# Patient Record
Sex: Female | Born: 1980 | Race: White | Hispanic: No | Marital: Married | Smoking: Former smoker
Health system: Southern US, Community
[De-identification: ages and names within clinical notes are randomized; demographics above are authoritative.]

## PROBLEM LIST (undated history)

## (undated) DIAGNOSIS — F410 Panic disorder [episodic paroxysmal anxiety] without agoraphobia: Secondary | ICD-10-CM

## (undated) DIAGNOSIS — E282 Polycystic ovarian syndrome: Secondary | ICD-10-CM

## (undated) DIAGNOSIS — IMO0002 Reserved for concepts with insufficient information to code with codable children: Secondary | ICD-10-CM

## (undated) DIAGNOSIS — R87619 Unspecified abnormal cytological findings in specimens from cervix uteri: Secondary | ICD-10-CM

## (undated) DIAGNOSIS — B029 Zoster without complications: Secondary | ICD-10-CM

## (undated) DIAGNOSIS — M797 Fibromyalgia: Secondary | ICD-10-CM

## (undated) HISTORY — DX: Fibromyalgia: M79.7

## (undated) HISTORY — PX: BREAST BIOPSY: SHX20

## (undated) HISTORY — DX: Zoster without complications: B02.9

## (undated) HISTORY — PX: WISDOM TOOTH EXTRACTION: SHX21

---

## 2001-02-09 ENCOUNTER — Other Ambulatory Visit: Admission: RE | Admit: 2001-02-09 | Discharge: 2001-02-09 | Payer: Self-pay | Admitting: Emergency Medicine

## 2003-10-21 ENCOUNTER — Other Ambulatory Visit: Admission: RE | Admit: 2003-10-21 | Discharge: 2003-10-21 | Payer: Self-pay | Admitting: Obstetrics and Gynecology

## 2005-05-03 ENCOUNTER — Other Ambulatory Visit: Admission: RE | Admit: 2005-05-03 | Discharge: 2005-05-03 | Payer: Self-pay | Admitting: Obstetrics and Gynecology

## 2006-04-27 ENCOUNTER — Other Ambulatory Visit: Admission: RE | Admit: 2006-04-27 | Discharge: 2006-04-27 | Payer: Self-pay | Admitting: Obstetrics and Gynecology

## 2007-05-08 ENCOUNTER — Other Ambulatory Visit: Admission: RE | Admit: 2007-05-08 | Discharge: 2007-05-08 | Payer: Self-pay | Admitting: Obstetrics and Gynecology

## 2008-05-14 ENCOUNTER — Other Ambulatory Visit: Admission: RE | Admit: 2008-05-14 | Discharge: 2008-05-14 | Payer: Self-pay | Admitting: Obstetrics and Gynecology

## 2009-07-21 ENCOUNTER — Other Ambulatory Visit: Admission: RE | Admit: 2009-07-21 | Discharge: 2009-07-21 | Payer: Self-pay | Admitting: Obstetrics and Gynecology

## 2010-01-02 ENCOUNTER — Ambulatory Visit: Payer: Self-pay | Admitting: Internal Medicine

## 2010-01-05 LAB — CONVERTED CEMR LAB
AST: 20 units/L (ref 0–37)
Alkaline Phosphatase: 52 units/L (ref 39–117)
BUN: 9 mg/dL (ref 6–23)
Basophils Absolute: 0.4 10*3/uL — ABNORMAL HIGH (ref 0.0–0.1)
Bilirubin Urine: NEGATIVE
Bilirubin, Direct: 0.1 mg/dL (ref 0.0–0.3)
Calcium: 9 mg/dL (ref 8.4–10.5)
GFR calc non Af Amer: 90.74 mL/min (ref >60)
Glucose, Bld: 88 mg/dL (ref 70–99)
HDL: 82.1 mg/dL (ref >39.00)
LDL Cholesterol: 79 mg/dL (ref 0–99)
Leukocytes, UA: NEGATIVE
Lymphocytes Relative: 36.9 % (ref 12.0–46.0)
Monocytes Relative: 5.9 % (ref 3.0–12.0)
Nitrite: NEGATIVE
Platelets: 354 10*3/uL (ref 150.0–400.0)
RDW: 12.4 % (ref 11.5–14.6)
Sodium: 140 meq/L (ref 135–145)
Total Bilirubin: 0.3 mg/dL (ref 0.3–1.2)
Total CHOL/HDL Ratio: 2
Total Protein, Urine: NEGATIVE mg/dL
VLDL: 15.6 mg/dL (ref 0.0–40.0)
WBC: 7.3 10*3/uL (ref 4.5–10.5)

## 2010-01-06 ENCOUNTER — Ambulatory Visit: Payer: Self-pay | Admitting: Internal Medicine

## 2010-01-06 DIAGNOSIS — F329 Major depressive disorder, single episode, unspecified: Secondary | ICD-10-CM

## 2010-01-06 DIAGNOSIS — J309 Allergic rhinitis, unspecified: Secondary | ICD-10-CM | POA: Insufficient documentation

## 2010-01-06 DIAGNOSIS — Z9189 Other specified personal risk factors, not elsewhere classified: Secondary | ICD-10-CM | POA: Insufficient documentation

## 2010-01-06 DIAGNOSIS — F419 Anxiety disorder, unspecified: Secondary | ICD-10-CM

## 2010-12-01 NOTE — Assessment & Plan Note (Signed)
Summary: NEW PT CPX// [employer] / [employer]  #   Vital Signs:  Patient profile:   30 year old female Height:      66.5 inches (168.91 cm) Weight:      142.0 pounds (64.55 kg) BMI:     22.66 O2 Sat:      93 % on Room air Temp:     99.2 degrees F (37.33 degrees C) oral Pulse rate:   101 / minute BP sitting:   120 / 82  (left arm) Cuff size:   regular  Vitals Entered By: Orlan Leavens (January 06, 2010 2:04 PM)  O2 Flow:  Room air CC: New patient cpx  Is Patient Diabetic? No Pain Assessment Patient in pain? no        Primary Care Provider:  Newt Lukes MD  CC:  New patient cpx .  History of Present Illness: new pt to me and our practice - patient is here today for annual physical. Patient feels well and has no complaints.   intrested in becoming pregnant in next 2-3 months - concerned about taking cymbalta during pregnancy - took for anxiety over the past year - feels anxiety symptoms under good control -  Preventive Screening-Counseling & Management  Alcohol-Tobacco     Alcohol drinks/day: <1     Alcohol Counseling: not indicated; use of alcohol is not excessive or problematic     Smoking Status: quit     Tobacco Counseling: not to resume use of tobacco products  Caffeine-Diet-Exercise     Does Patient Exercise: yes     Exercise Counseling: not indicated; exercise is adequate     Depression Counseling: not indicated; screening negative for depression  Safety-Violence-Falls     Seat Belt Use: yes     Helmet Use: yes     Firearms in the Home: firearms in the home     Firearm Counseling: not indicated; uses recommended firearm safety measures     Smoke Detectors: yes     Violence in the Home: no risk noted     Fall Risk Counseling: not indicated; no significant falls noted  Current Medications (verified): 1)  Trinessa (28) 0.18/0.215/0.25 Mg-35 Mcg Tabs (Norgestim-Eth Estrad Triphasic) .... Once Daily 2)  Singulair 10 Mg Tabs (Montelukast Sodium) .... Take 1 By Mouth  Qd 3)  Cymbalta 30 Mg Cpep (Duloxetine Hcl) .... Take 1 By Mouth Once Daily 4)  Loratadine 10 Mg Tabs (Loratadine) .... Take 1 By Mouth Qd 5)  Calcium 600 Mg Tabs (Calcium) .... Take 1 By Mouth Once Daily 6)  Flaxseed Oil 1000 Mg Caps (Flaxseed (Linseed)) .... Once Daily 7)  Pre-Natal Formula  Tabs (Prenatal Multivit-Min-Fe-Fa) .... 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 I have reviewed all other systems and they were negative.   Physical Exam  General:  alert, well-developed, well-nourished, and cooperative to examination.    Eyes:  vision grossly intact; pupils equal, round and reactive  to light.  conjunctiva and lids normal.    Ears:  normal pinnae bilaterally, without erythema, swelling, or tenderness to palpation. TMs clear, without effusion, or cerumen impaction. Hearing grossly normal bilaterally  Nose:  External nasal examination shows no deformity or inflammation. Nasal mucosa are pink and moist without lesions or exudates. Mouth:  teeth and gums in good repair; mucous membranes moist, without lesions or ulcers. oropharynx clear without exudate, no erythema.  Neck:  supple, full ROM, no masses, no thyromegaly; no thyroid nodules or tenderness. no JVD or carotid bruits.   Lungs:  normal respiratory effort, no intercostal retractions or use of  accessory muscles; normal breath sounds bilaterally - no crackles and no wheezes.    Heart:  normal rate, regular rhythm, no murmur, and no rub. BLE without edema.  Abdomen:  soft, non-tender, normal bowel sounds, no distention; no masses and no appreciable hepatomegaly or splenomegaly.   Genitalia:  defer to gyn Msk:  No deformity or scoliosis noted of thoracic or lumbar spine.   Neurologic:  alert & oriented X3 and cranial nerves II-XII symetrically intact.  strength normal in all extremities, sensation intact to light touch, and gait normal. speech fluent without dysarthria or aphasia; follows commands with good comprehension.  Skin:  no rashes, vesicles, ulcers, or erythema. No nodules or irregularity to palpation.  Psych:  Oriented X3, memory intact for recent and remote, normally interactive, good eye contact, not anxious appearing, not depressed appearing, and not agitated.      Impression & Recommendations:  Problem # 1:  PREVENTIVE HEALTH CARE (ICD-V70.0) Patient has been counseled on age-appropriate routine health concerns for screening and prevention. These are reviewed and up-to-date. Immunizations are up-to-date or declined. Labs reviewed. ok to stop BCP and taper SNRI (but pt to watch for recurrent symptoms of anxiety/depression as needed)  Complete Medication List: 1)  Singulair 10 Mg Tabs (Montelukast sodium) .... Take 1 by mouth qd 2)  Loratadine 10 Mg Tabs (Loratadine) .... Take 1 by mouth qd 3)  Calcium 600 Mg Tabs (Calcium) .... Take 1 by mouth once daily 4)  Flaxseed Oil 1000 Mg Caps (Flaxseed (linseed)) .... Once daily 5)  Pre-natal Formula Tabs (Prenatal multivit-min-fe-fa) .... Once daily 6)  Cymbalta 30 Mg Cpep (Duloxetine hcl) .... Take 1 by mouth every other day x 2 weeks, then stop 7)  Trinessa (28) 0.18/0.215/0.25 Mg-35 Mcg Tabs (Norgestim-eth estrad triphasic) .... Stop  Patient Instructions: 1)  it was good to see you today. 2)  exam and labs look great! good  luck! 3)  taper off cymbalta as discussed - let us know (or your ob-gyn) if you have any recurrent symptoms of anxiety or depression at any time going forward as there are other medication options if needed- 4)  Please schedule a follow-up appointment annually for medical physical and labs, sooner if problems.    Immunization History:  Tetanus/Td Immunization History:    Tetanus/Td:  historical (09/01/2006)  Influenza Immunization History:    Influenza:  historical (08/01/2009)    Pap Smear  Procedure date:  07/02/2009  Findings:      Interpretation/Result:Negative for intraepithelial Lesion or Malignancy.

## 2011-01-11 ENCOUNTER — Encounter (INDEPENDENT_AMBULATORY_CARE_PROVIDER_SITE_OTHER): Payer: Self-pay | Admitting: *Deleted

## 2011-01-11 ENCOUNTER — Other Ambulatory Visit: Payer: Self-pay | Admitting: Internal Medicine

## 2011-01-11 ENCOUNTER — Encounter (INDEPENDENT_AMBULATORY_CARE_PROVIDER_SITE_OTHER): Payer: 59 | Admitting: Internal Medicine

## 2011-01-11 ENCOUNTER — Encounter: Payer: Self-pay | Admitting: Internal Medicine

## 2011-01-11 ENCOUNTER — Other Ambulatory Visit: Payer: Self-pay

## 2011-01-11 DIAGNOSIS — E785 Hyperlipidemia, unspecified: Secondary | ICD-10-CM

## 2011-01-11 DIAGNOSIS — Z0000 Encounter for general adult medical examination without abnormal findings: Secondary | ICD-10-CM

## 2011-01-11 DIAGNOSIS — E282 Polycystic ovarian syndrome: Secondary | ICD-10-CM | POA: Insufficient documentation

## 2011-01-11 LAB — BASIC METABOLIC PANEL
BUN: 14 mg/dL (ref 6–23)
CO2: 25 mEq/L (ref 19–32)
Chloride: 104 mEq/L (ref 96–112)
Glucose, Bld: 88 mg/dL (ref 70–99)
Potassium: 4 mEq/L (ref 3.5–5.1)

## 2011-01-11 LAB — URINALYSIS
Bilirubin Urine: NEGATIVE
Ketones, ur: NEGATIVE
Leukocytes, UA: NEGATIVE
Urobilinogen, UA: 0.2 (ref 0.0–1.0)

## 2011-01-11 LAB — TSH: TSH: 2.28 u[IU]/mL (ref 0.35–5.50)

## 2011-01-11 LAB — CBC WITH DIFFERENTIAL/PLATELET
Basophils Absolute: 0 10*3/uL (ref 0.0–0.1)
Eosinophils Absolute: 0.1 10*3/uL (ref 0.0–0.7)
Hemoglobin: 13.8 g/dL (ref 12.0–15.0)
Lymphocytes Relative: 36.4 % (ref 12.0–46.0)
MCHC: 34.2 g/dL (ref 30.0–36.0)
Neutro Abs: 3.7 10*3/uL (ref 1.4–7.7)
RDW: 13.2 % (ref 11.5–14.6)

## 2011-01-11 LAB — HEPATIC FUNCTION PANEL
ALT: 17 U/L (ref 0–35)
AST: 18 U/L (ref 0–37)
Albumin: 4.4 g/dL (ref 3.5–5.2)
Total Protein: 7.3 g/dL (ref 6.0–8.3)

## 2011-01-11 LAB — LIPID PANEL: HDL: 73.5 mg/dL (ref >39.00)

## 2011-01-19 NOTE — Assessment & Plan Note (Signed)
Summary: PHYSICAL--STC   Vital Signs:  Patient profile:   30 year old female Weight:      137.0 pounds (62.27 kg) BMI:     21.86 O2 Sat:      98 % on Room air Temp:     99.5 degrees F (37.50 degrees C) oral Pulse rate:   102 / minute BP sitting:   102 / 62  (left arm) Cuff size:   regular  Vitals Entered By: Orlan Leavens RMA (January 11, 2011 2:01 PM)  O2 Flow:  Room air CC: CPX Is Patient Diabetic? No Pain Assessment Patient in pain? no        Primary Care Provider:  Newt Lukes MD  CC:  CPX.  History of Present Illness:  patient is here today for annual physical. Patient feels well and has no complaints.    reviewed other chronic med issues: dx PCOS 11/2010 while undergoing eval for infertility - started on metformin for same  anxiety and depression -concerned about taking cymbalta during pregnancy and off x 8 mo but resumed when dx with pcos/infertility  Preventive Screening-Counseling & Management  Alcohol-Tobacco     Alcohol drinks/day: <1     Alcohol Counseling: not indicated; use of alcohol is not excessive or problematic     Smoking Status: quit     Tobacco Counseling: not to resume use of tobacco products  Caffeine-Diet-Exercise     Does Patient Exercise: yes     Exercise Counseling: not indicated; exercise is adequate     Depression Counseling: not indicated; screening negative for depression  Safety-Violence-Falls     Seat Belt Use: yes     Helmet Use: yes     Firearms in the Home: firearms in the home     Firearm Counseling: not indicated; uses recommended firearm safety measures     Smoke Detectors: yes     Violence in the Home: no risk noted     Fall Risk Counseling: not indicated; no significant falls noted  Clinical Review Panels:  Prevention   Last Pap Smear:  Interpretation/Result:Negative for intraepithelial Lesion or Malignancy.    (07/02/2009)  Immunizations   Last Tetanus Booster:  Historical (09/01/2006)   Last Flu  Vaccine:  Historical (08/01/2009)  Lipid Management   Cholesterol:  205 (01/11/2011)   LDL (bad choesterol):  79 (01/02/2010)   HDL (good cholesterol):  73.50 (01/11/2011)  CBC   WBC:  6.7 (01/11/2011)   RBC:  4.61 (01/11/2011)   Hgb:  13.8 (01/11/2011)   Hct:  40.3 (01/11/2011)   Platelets:  334.0 (01/11/2011)   MCV  87.4 (01/11/2011)   MCHC  34.2 (01/11/2011)   RDW  13.2 (01/11/2011)   PMN:  55.3 (01/11/2011)   Lymphs:  36.4 (01/11/2011)   Monos:  6.2 (01/11/2011)   Eosinophils:  1.7 (01/11/2011)   Basophil:  0.4 (01/11/2011)  Complete Metabolic Panel   Glucose:  88 (01/11/2011)   Sodium:  137 (01/11/2011)   Potassium:  4.0 (01/11/2011)   Chloride:  104 (01/11/2011)   CO2:  25 (01/11/2011)   BUN:  14 (01/11/2011)   Creatinine:  0.6 (01/11/2011)   Albumin:  4.4 (01/11/2011)   Total Protein:  7.3 (01/11/2011)   Calcium:  9.3 (01/11/2011)   Total Bili:  0.4 (01/11/2011)   Alk Phos:  58 (01/11/2011)   SGPT (ALT):  17 (01/11/2011)   SGOT (AST):  18 (01/11/2011)   Current Medications (verified): 1)  Pre-Natal Formula  Tabs (Prenatal Multivit-Min-Fe-Fa) .... Once Daily  2)  Cymbalta 20 Mg Cpep (Duloxetine Hcl) .... Take 1 By Mouth Once Daily 3)  Glucophage Xr 500 Mg Xr24h-Tab (Metformin Hcl) .... Take 1 Two Times A Day  Allergies (verified): No Known Drug Allergies  Past History:  Past medical, surgical, family and social histories (including risk factors) reviewed, and no changes noted (except as noted below).  Past Medical History: Allergic rhinitis Depression/anxiety PCOS  MD roster: gyn - fogelman  Past Surgical History: Reviewed history from 01/06/2010 and no changes required. Denies surgical history  Family History: Reviewed history from 01/06/2010 and no changes required. Family History of Alcoholism/Addiction (parent) Family History of Arthritis (grandparent) Family History Diabetes 1st degree relative (parent) Family History High cholesterol  (parent) Family History Hypertension (parent) Family History Ovarian cancer(grandparent)  Social History: Reviewed history from 01/06/2010 and no changes required. 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 I have reviewed all other systems and they were negative.   Physical Exam  General:  alert, well-developed, well-nourished, and cooperative to examination.    Head:  Normocephalic and atraumatic without obvious abnormalities. No apparent alopecia or balding. Eyes:  vision grossly intact; pupils equal, round and reactive to light.  conjunctiva and lids normal.    Ears:  normal pinnae bilaterally, without erythema, swelling, or tenderness to palpation. TMs clear, without effusion, or cerumen impaction. Hearing grossly normal bilaterally  Mouth:  teeth and gums in good repair; mucous membranes moist, without lesions or ulcers. oropharynx clear without exudate, no erythema.  Neck:  supple, full ROM, no masses, no thyromegaly; no thyroid nodules or tenderness. no JVD or carotid bruits.   Lungs:  normal respiratory effort, no intercostal retractions or use of accessory muscles; normal breath sounds bilaterally - no crackles and no wheezes.    Heart:  normal rate, regular rhythm, no murmur, and no rub. BLE without edema.  Abdomen:  soft, non-tender, normal bowel sounds, no distention; no masses and no appreciable hepatomegaly or splenomegaly.   Genitalia:  defer to gyn Msk:  No deformity or scoliosis noted of thoracic or lumbar spine.   Neurologic:  alert & oriented X3 and cranial nerves II-XII symetrically intact.  strength normal in all extremities, sensation intact to light touch, and gait normal. speech fluent without dysarthria or aphasia; follows commands with good comprehension.  Skin:  no rashes, vesicles, ulcers, or erythema. No nodules or irregularity to palpation.  Psych:  Oriented X3, memory intact for  recent and remote, normally interactive, good eye contact, not anxious appearing, not depressed appearing, and not agitated.      Impression & Recommendations:  Problem # 1:  PREVENTIVE HEALTH CARE (ICD-V70.0) Patient has been counseled on age-appropriate routine health concerns for screening and prevention. These are reviewed and up-to-date. Immunizations are up-to-date or declined. Labs reviewed.   Complete Medication List: 1)  Pre-natal Formula Tabs (Prenatal multivit-min-fe-fa) .... Once daily 2)  Cymbalta 20 Mg Cpep (Duloxetine hcl) .... Take 1 by mouth once daily 3)  Glucophage Xr 500 Mg Xr24h-tab (Metformin hcl) .... Take 1 two times a day  Patient Instructions: 1)  it was good to see you today. 2)  exam and labs look good! stay active and make healthy food choices as discussed 3)  continue cymbalta as discussed - let us know (or your ob-gyn) if you have any recurrent symptoms of anxiety or depression at any time going forward as there are  other medication options if needed- 4)  Please schedule a follow-up appointment annually for medical physical and labs, sooner if problems.    Orders Added: 1)  Est. Patient 18-39 years [99395]

## 2011-03-06 ENCOUNTER — Encounter: Payer: Self-pay | Admitting: Family Medicine

## 2011-03-06 ENCOUNTER — Inpatient Hospital Stay (INDEPENDENT_AMBULATORY_CARE_PROVIDER_SITE_OTHER)
Admission: RE | Admit: 2011-03-06 | Discharge: 2011-03-06 | Disposition: A | Discharge status: Home / Self Care | Payer: 59 | Source: Ambulatory Visit | Attending: Family Medicine | Admitting: Family Medicine

## 2011-03-06 DIAGNOSIS — J069 Acute upper respiratory infection, unspecified: Secondary | ICD-10-CM

## 2011-05-03 ENCOUNTER — Inpatient Hospital Stay (INDEPENDENT_AMBULATORY_CARE_PROVIDER_SITE_OTHER)
Admission: RE | Admit: 2011-05-03 | Discharge: 2011-05-03 | Disposition: A | Discharge status: Home / Self Care | Payer: 59 | Source: Ambulatory Visit | Attending: Family Medicine | Admitting: Family Medicine

## 2011-05-03 DIAGNOSIS — R197 Diarrhea, unspecified: Secondary | ICD-10-CM

## 2011-06-07 LAB — GC/CHLAMYDIA PROBE AMP, GENITAL

## 2011-06-07 LAB — ANTIBODY SCREEN: Antibody Screen: NEGATIVE

## 2011-06-07 LAB — HEPATITIS B SURFACE ANTIGEN: Hepatitis B Surface Ag: NEGATIVE

## 2011-10-04 NOTE — Progress Notes (Signed)
Summary: SINUS INFECTION/WSE (RM3)   Vital Signs:  Patient Profile:   30 Years Old Female CC:      SINUS INFECTION Height:     66.5 inches (168.91 cm) Weight:      132 pounds O2 Sat:      100 % O2 treatment:    Room Air Temp:     98.6 degrees F oral Pulse rate:   83 / minute Resp:     18 per minute BP sitting:   114 / 76  (left arm) Cuff size:   regular  Vitals Entered By: Linton Flemings RN (Mar 06, 2011 1:22 PM)                  Current Allergies: No known allergies History of Present Illness Chief Complaint: SINUS INFECTION History of Present Illness:  Subjective: Patient complains of URI symptoms that started 5 days ago with a non-productive cough, nasal congestion, and hoarseness.  Her cough is worse at night.  She has a history of seasonal allergies and takes Singulair and Claritin. No sore throat No pleuritic pain No wheezing + post-nasal drainage ? sinus pain/pressure No itchy/red eyes No earache No hemoptysis No SOB No fever/chills No nausea No vomiting No abdominal pain No diarrhea No skin rashes + fatigue No myalgias No headache     REVIEW OF SYSTEMS Constitutional Symptoms      Denies fever, chills, night sweats, weight loss, weight gain, and fatigue.  Eyes       Denies change in vision, eye pain, eye discharge, glasses, contact lenses, and eye surgery. Ear/Nose/Throat/Mouth       Complains of ear pain, frequent runny nose, sinus problems, and hoarseness.      Denies hearing loss/aids, change in hearing, ear discharge, dizziness, frequent nose bleeds, sore throat, and tooth pain or bleeding.  Respiratory       Complains of dry cough and wheezing.      Denies productive cough, shortness of breath, asthma, bronchitis, and emphysema/COPD.  Cardiovascular       Denies murmurs, chest pain, and tires easily with exhertion.    Gastrointestinal       Denies stomach pain, nausea/vomiting, diarrhea, constipation, blood in bowel movements, and  indigestion. Genitourniary       Denies painful urination, kidney stones, and loss of urinary control. Neurological       Complains of headaches.      Denies paralysis, seizures, and fainting/blackouts. Musculoskeletal       Denies muscle pain, joint pain, joint stiffness, decreased range of motion, redness, swelling, muscle weakness, and gout.  Skin       Denies bruising, unusual mles/lumps or sores, and hair/skin or nail changes.  Psych       Denies mood changes, temper/anger issues, anxiety/stress, speech problems, depression, and sleep problems. Other Comments: SYMPTOMS STARTED MONDAY   Past History:  Past Medical History: Reviewed history from 01/11/2011 and no changes required. Allergic rhinitis Depression/anxiety PCOS  MD roster: gyn - fogelman  Past Surgical History: Reviewed history from 01/06/2010 and no changes required. Denies surgical history  Family History: Reviewed history from 01/06/2010 and no changes required. Family History of Alcoholism/Addiction (parent) Family History of Arthritis (grandparent) Family History Diabetes 1st degree relative (parent) Family History High cholesterol (parent) Family History Hypertension (parent) Family History Ovarian cancer(grandparent)  Social History: Reviewed history from 01/06/2010 and no changes required. 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  Extraocular movement is intact.  Conjunctivae are not inflamed.  Ears:  Canals normal.  Tympanic membranes normal.   Nose:  Mildly congested turbinates.  No sinus tenderness  Mouth:  No lesions Pharynx:  Normal  Neck:  Supple.  Prominent but nontender shotty posterior nodes are palpated bilaterally.  Lungs:  Clear to auscultation.  Breath sounds are equal.  Heart:   Regular rate and rhythm without murmurs, rubs, or gallops.  Abdomen:  Nontender without masses or hepatosplenomegaly.  Bowel sounds are present.  No CVA or flank tenderness.  Extremities:  No edema.   Skin:  No rash Assessment New Problems: UPPER RESPIRATORY INFECTION, ACUTE (ICD-465.9)  NO EVIDENCE BACTERIAL INFECTION TODAY  Plan New Medications/Changes: AMOXICILLIN 875 MG TABS (AMOXICILLIN) One by mouth two times a day (Rx void after 03/14/11)  #20 x 0, 03/06/2011, Donna Christen MD  New Orders: Pulse Oximetry (single measurment) [phone] New Patient Level III [99203] Services provided After hours-Weekends-Holidays [99051] Planning Comments:   Treat symptomatically for now:  Increase fluid intake, begin expectorant/decongestant, topical decongestant,  cough suppressant at bedtime.  If fever/chills/sweats persist, or if not improving 5  days begin Z-pack (given Rx to hold).  May continue Singulair. Followup with PCP if not improving 7 to 10 days.   The patient and/or caregiver has been counseled thoroughly with regard to medications prescribed including dosage, schedule, interactions, rationale for use, and possible side effects and they verbalize understanding.  Diagnoses and expected course of recovery discussed and will return if not improved as expected or if the condition worsens. Patient and/or caregiver verbalized understanding.  Prescriptions: AMOXICILLIN 875 MG TABS (AMOXICILLIN) One by mouth two times a day (Rx void after 03/14/11)  #20 x 0   Entered and Authorized by:   Donna Christen MD   Signed by:   Donna Christen MD on 03/06/2011   Method used:   Print then Give to Patient   RxID:   [id number]   Patient Instructions: 1)  Take Mucinex D (guaifenesin with decongestant) twice daily for congestion. 2)  Increase fluid intake, rest. 3)  May use Afrin nasal spray (or generic oxymetazoline) twice daily for about 5 days.  Also recommend using saline nasal spray several times  daily and/or saline nasal irrigation. 4)  Begin Amoxicillin if not improving about 5 days or if persistent fever develops. 5)  May continue Delsym at bedtime for cough 6)  Followup with family doctor if not improving 7 to 10 days.   Orders Added: 1)  Pulse Oximetry (single measurment) [94760] 2)  New Patient Level III [86578] 3)  Services provided After hours-Weekends-Holidays [99051]

## 2011-10-12 LAB — RPR: RPR: NONREACTIVE

## 2011-11-02 NOTE — L&D Delivery Note (Signed)
Operative Delivery Note At 11:50 AM a viable and healthy female was delivered via Vaginal, Vacuum assistance with 2 gentle controlled pulls with perineal protection and maternal effort..  Indication: maternal exhaustion, pushed for almost 2 hrs, OP position for most of the pushing effort.  Presentation: vertex; Position: Occiput,, Posterior; Station: +3.  Verbal consent: obtained from patient.  Risks and benefits discussed in detail.  Risks include, but are not limited to the risks of anesthesia, bleeding, infection, damage to maternal tissues, fetal cephalhematoma.  There is also the risk of inability to effect vaginal delivery of the head, or shoulder dystocia that cannot be resolved by established maneuvers, leading to the need for emergency cesarean section.  APGAR: 8, 9; weight 6 lb 8.8 oz (2971 g).   Placenta status: Intact, Spontaneous, sent to path since preterm.   Cord: 3 vessels with the following complications: None.  Cord pH: 7.3  Anesthesia: Pudendal  Instruments: Mini vac vacuum Episiotomy: none Lacerations: vaginal  Suture Repair: 3.0 vicryl rapide Est. Blood Loss (mL): 300 cc  Mom to postpartum.  Baby received by NICU team, healthy, stable, will stay with mother. Plan breast feeding.   Keyaan Lederman R 12/02/2011, 1:04 PM

## 2011-12-01 LAB — STREP B DNA PROBE

## 2011-12-02 ENCOUNTER — Encounter (HOSPITAL_COMMUNITY): Payer: Self-pay | Admitting: *Deleted

## 2011-12-02 ENCOUNTER — Other Ambulatory Visit: Payer: Self-pay | Admitting: Obstetrics & Gynecology

## 2011-12-02 ENCOUNTER — Inpatient Hospital Stay (HOSPITAL_COMMUNITY)
Admission: AD | Admit: 2011-12-02 | Discharge: 2011-12-04 | DRG: 775 | Disposition: A | Discharge status: Home / Self Care | Payer: 59 | Source: Ambulatory Visit | Attending: Obstetrics & Gynecology | Admitting: Obstetrics & Gynecology

## 2011-12-02 DIAGNOSIS — Z23 Encounter for immunization: Secondary | ICD-10-CM

## 2011-12-02 DIAGNOSIS — O4290 Premature rupture of membranes, unspecified as to length of time between rupture and onset of labor, unspecified weeks of gestation: Secondary | ICD-10-CM | POA: Diagnosis present

## 2011-12-02 DIAGNOSIS — O42013 Preterm premature rupture of membranes, onset of labor within 24 hours of rupture, third trimester: Secondary | ICD-10-CM | POA: Diagnosis present

## 2011-12-02 HISTORY — DX: Polycystic ovarian syndrome: E28.2

## 2011-12-02 HISTORY — DX: Panic disorder (episodic paroxysmal anxiety): F41.0

## 2011-12-02 HISTORY — DX: Unspecified abnormal cytological findings in specimens from cervix uteri: R87.619

## 2011-12-02 HISTORY — DX: Reserved for concepts with insufficient information to code with codable children: IMO0002

## 2011-12-02 LAB — RPR: RPR Ser Ql: NONREACTIVE

## 2011-12-02 LAB — CBC
Hemoglobin: 12.9 g/dL (ref 12.0–15.0)
MCHC: 32.8 g/dL (ref 30.0–36.0)
Platelets: 434 10*3/uL — ABNORMAL HIGH (ref 150–400)
RDW: 13.7 % (ref 11.5–15.5)

## 2011-12-02 MED ORDER — LIDOCAINE HCL (PF) 1 % IJ SOLN
30.0000 mL | INTRAMUSCULAR | Status: DC | PRN
  Filled 2011-12-02: qty 30

## 2011-12-02 MED ORDER — SODIUM CHLORIDE 0.9 % IV SOLN
2.0000 g | Freq: Once | INTRAVENOUS | Status: AC
  Administered 2011-12-02: 2 g via INTRAVENOUS
  Filled 2011-12-02: qty 2000

## 2011-12-02 MED ORDER — TETANUS-DIPHTH-ACELL PERTUSSIS 5-2.5-18.5 LF-MCG/0.5 IM SUSP: 0.5000 mL | Freq: Once | INTRAMUSCULAR | Status: DC

## 2011-12-02 MED ORDER — DIBUCAINE 1 % RE OINT
1.0000 "application " | TOPICAL_OINTMENT | RECTAL | Status: DC | PRN
  Administered 2011-12-04: 1 via RECTAL
  Filled 2011-12-02: qty 28

## 2011-12-02 MED ORDER — ACETAMINOPHEN 325 MG PO TABS: 650.0000 mg | ORAL_TABLET | ORAL | Status: DC | PRN

## 2011-12-02 MED ORDER — FLEET ENEMA 7-19 GM/118ML RE ENEM: 1.0000 | ENEMA | RECTAL | Status: DC | PRN

## 2011-12-02 MED ORDER — ONDANSETRON HCL 4 MG PO TABS: 4.0000 mg | ORAL_TABLET | ORAL | Status: DC | PRN

## 2011-12-02 MED ORDER — ZOLPIDEM TARTRATE 5 MG PO TABS: 5.0000 mg | ORAL_TABLET | Freq: Every evening | ORAL | Status: DC | PRN

## 2011-12-02 MED ORDER — LACTATED RINGERS IV SOLN
500.0000 mL | INTRAVENOUS | Status: DC | PRN
Start: 2011-12-02 — End: 2011-12-02

## 2011-12-02 MED ORDER — DIPHENHYDRAMINE HCL 25 MG PO CAPS: 25.0000 mg | ORAL_CAPSULE | Freq: Four times a day (QID) | ORAL | Status: DC | PRN

## 2011-12-02 MED ORDER — LACTATED RINGERS IV SOLN: INTRAVENOUS | Status: DC

## 2011-12-02 MED ORDER — BENZOCAINE-MENTHOL 20-0.5 % EX AERO: 1.0000 "application " | INHALATION_SPRAY | CUTANEOUS | Status: DC | PRN

## 2011-12-02 MED ORDER — ONDANSETRON HCL 4 MG/2ML IJ SOLN: 4.0000 mg | Freq: Four times a day (QID) | INTRAMUSCULAR | Status: DC | PRN

## 2011-12-02 MED ORDER — BENZOCAINE-MENTHOL 20-0.5 % EX AERO
INHALATION_SPRAY | CUTANEOUS | Status: AC
  Administered 2011-12-02: 15:00:00
  Filled 2011-12-02: qty 56

## 2011-12-02 MED ORDER — OXYTOCIN 20 UNITS IN LACTATED RINGERS INFUSION - SIMPLE
125.0000 mL/h | Freq: Once | INTRAVENOUS | Status: AC
  Administered 2011-12-02: 999 mL/h via INTRAVENOUS

## 2011-12-02 MED ORDER — OXYTOCIN BOLUS FROM INFUSION
500.0000 mL | Freq: Once | INTRAVENOUS | Status: DC
  Filled 2011-12-02: qty 1000
  Filled 2011-12-02: qty 500

## 2011-12-02 MED ORDER — ONDANSETRON HCL 4 MG/2ML IJ SOLN: 4.0000 mg | INTRAMUSCULAR | Status: DC | PRN

## 2011-12-02 MED ORDER — PRENATAL MULTIVITAMIN CH
1.0000 | ORAL_TABLET | Freq: Every day | ORAL | Status: DC
  Administered 2011-12-02 – 2011-12-04 (×3): 1 via ORAL
  Filled 2011-12-02 (×3): qty 1

## 2011-12-02 MED ORDER — SENNOSIDES-DOCUSATE SODIUM 8.6-50 MG PO TABS
2.0000 | ORAL_TABLET | Freq: Every day | ORAL | Status: DC
  Administered 2011-12-02 – 2011-12-03 (×2): 2 via ORAL

## 2011-12-02 MED ORDER — OXYCODONE-ACETAMINOPHEN 5-325 MG PO TABS
1.0000 | ORAL_TABLET | ORAL | Status: DC | PRN
Start: 2011-12-02 — End: 2011-12-02

## 2011-12-02 MED ORDER — CITRIC ACID-SODIUM CITRATE 334-500 MG/5ML PO SOLN: 30.0000 mL | ORAL | Status: DC | PRN

## 2011-12-02 MED ORDER — LANOLIN HYDROUS EX OINT: TOPICAL_OINTMENT | CUTANEOUS | Status: DC | PRN

## 2011-12-02 MED ORDER — WITCH HAZEL-GLYCERIN EX PADS: 1.0000 "application " | MEDICATED_PAD | CUTANEOUS | Status: DC | PRN

## 2011-12-02 MED ORDER — SIMETHICONE 80 MG PO CHEW: 80.0000 mg | CHEWABLE_TABLET | ORAL | Status: DC | PRN

## 2011-12-02 MED ORDER — IBUPROFEN 600 MG PO TABS
600.0000 mg | ORAL_TABLET | Freq: Four times a day (QID) | ORAL | Status: DC
  Administered 2011-12-02 – 2011-12-04 (×7): 600 mg via ORAL
  Filled 2011-12-02 (×7): qty 1

## 2011-12-02 MED ORDER — IBUPROFEN 600 MG PO TABS: 600.0000 mg | ORAL_TABLET | Freq: Four times a day (QID) | ORAL | Status: DC | PRN

## 2011-12-02 MED ORDER — OXYCODONE-ACETAMINOPHEN 5-325 MG PO TABS: 1.0000 | ORAL_TABLET | ORAL | Status: DC | PRN

## 2011-12-02 NOTE — Progress Notes (Signed)
Patient ID: Angela [name], female   DOB: [birth year], 31 y.o.   MRN: [id number] Called to see pt at 10 since noted to have 9+ cm dilatation and back pain, rectal pressure, desire to push.  VS stable, Ampicillin running. FHT - category I.  SVE - complete at 10 am and -1 stn. OP. Will start bearing down since pt very uncomfortable.  Pudendal block discussed, agrees.  Gave Bilateral Pudendal block with 20 cc lidocaine, tolerated well.

## 2011-12-02 NOTE — H&P (Signed)
Angela [name] is a 31 y.o. female presenting at 35.4/7 wks with leaking since 8.30 pm last night, off and on contrxns, got stronger this morning and more leaking and blood tinge fluid this morning.  PNCare Dr Ernestina Penna. Clomid preg (1 cycle), metformin in 1 st trim for PCOS. Uncomplicated course, normal glucose screens, was 3 cm dilated yesterday, GBS done yesterday, result not back.  FMs active. No PEC s/s.   History OB History    Grav Para Term Preterm Abortions TAB SAB Ect Mult Living   1 1 0 1 0 0 0 0 0 1      Past Medical History  Diagnosis Date  . Urinary tract infection   . Abnormal Pap smear     repeat ok  . PCOS (polycystic ovarian syndrome)     conceived on metformin and clomid  . Panic attacks   . Preterm labor    Past Surgical History  Procedure Date  . Wisdom tooth extraction    Family History: family history is negative for Anesthesia problems. Social History:  reports that she has quit smoking. She has never used smokeless tobacco. She reports that she does not drink alcohol or use illicit drugs.  Review of Systems  Constitutional: Negative for fever.  Eyes: Negative for blurred vision.  Respiratory: Negative for cough.   Cardiovascular: Negative for chest pain.  Gastrointestinal: Positive for abdominal pain.  Musculoskeletal: Positive for back pain.  Neurological: Negative for headaches.    Exam Physical Exam  Physical exam:  A&O x 3, no acute distress. Pleasant, appears comfortable with labor pain, wants to try without epidural HEENT neg, no thyromegaly Lungs CTA bilat CV RRR, S1S2 normal Abdo soft, contractile uterus, non acute Extr no edema/ tenderness Pelvic Spec exam- fetal scalp seen thru' cervix, fluid ++,clear, no blood or meconium           Cx 6 cm/100%/PROM/ stn -1/ Vtx.  FHT  140s/ + accels/ no decels/ mod variability- reassuring. Category I. Toco every 3-4 min  Prenatal labs: ABO, Rh:  A(+) Antibody:  neg Rubella:  Immune RPR:    neg HBsAg:   neg HIV:   neg GBS:   unknown (collected on 1/30 in office) Glucose screen neg  Anatomy sono normal, female. QUAD scr neg.  Assessment/Plan: 35.4/7 wks, active labor. EFW per sono 6.1/2 lbs (no DM and is well dated since clomid pregnancy).  PROM, active preterm labor.  Plan: Admit, Ampicillin for unknown GBS stat. Pain meds as desired, wants to hold off. Routine labor orders.   Darlene Brozowski R 12/02/2011, 12:51 PM

## 2011-12-02 NOTE — ED Notes (Signed)
Unable to draw rpr with IV start, cbc sent

## 2011-12-03 ENCOUNTER — Encounter (HOSPITAL_COMMUNITY): Payer: Self-pay | Admitting: Obstetrics and Gynecology

## 2011-12-03 LAB — CBC
Hemoglobin: 10.7 g/dL — ABNORMAL LOW (ref 12.0–15.0)
MCH: 28.5 pg (ref 26.0–34.0)
RBC: 3.75 MIL/uL — ABNORMAL LOW (ref 3.87–5.11)

## 2011-12-03 NOTE — Progress Notes (Signed)
Patient ID: Angela [name], female   DOB: May 09, 1981, 31 y.o.   MRN: [id number] PPD # 1  Subjective: Pt reports feeling sore and tired/ Pain controlled with prescription NSAID's including motrin Tolerating po/ Voiding without problems/ No n/v Bleeding is light Newborn info:  Information for the patient's newborn:  Valetta, Mulroy [[id number]]  female  /Infant stable and in mom's room; circ pending; infant has not latched and fed well at present/ Feeding: breast    Objective:  VS: Blood pressure 113/73, pulse 78, temperature 98.3 F (36.8 C), temperature source Axillary, resp. rate 20    Basename 12/03/11 0525 12/02/11 0933  WBC 18.3* 17.1*  HGB 10.7* 12.9  HCT 32.6* 39.3  PLT 365 434*    Blood type: A/Positive/-- (08/06 0000) Rubella: Immune (08/06 0000)    Physical Exam:  General: alert, cooperative and no distress CV: Regular rate and rhythm Resp: clear Abdomen: soft, nontender, normal bowel sounds Uterine Fundus: firm, below umbilicus, nontender Perineum: mild edema Lochia: moderate Ext: Homans sign is negative, no sign of DVT and no edema, redness or tenderness in the calves or thighs   A/P: PPD # 1/ G1P0101 S/P SVD Doing well Continue routine post partum orders Anticipate discharge home in AM  Signed: Arlana Lindau, [facility] 12/03/11 0930

## 2011-12-04 MED ORDER — IBUPROFEN 600 MG PO TABS: 600.0000 mg | ORAL_TABLET | Freq: Four times a day (QID) | ORAL | Status: AC

## 2011-12-04 NOTE — Progress Notes (Signed)
Post Partum Day #2            Information for the patient's newborn:  Polly, Barner [[id number]]  female difficulty latching, late preterm  / circumcision pending, will perform prior to discharge home Feeding: breast  Subjective: No HA, SOB, CP, F/C, breast symptoms. Pain controlled. Normal vaginal bleeding, no clots.      Objective:  Temp:  [97.6 F (36.4 C)-98.4 F (36.9 C)] 97.6 F (36.4 C) (02/02 0626) Pulse Rate:  [80-83] 83  (02/02 0626) Resp:  [18-20] 18  (02/02 0626) BP: (102-113)/(65-76) 102/68 mmHg (02/02 0626)  No intake or output data in the 24 hours ending 12/04/11 0721     Basename 12/03/11 0525 12/02/11 0933  WBC 18.3* 17.1*  HGB 10.7* 12.9  HCT 32.6* 39.3  PLT 365 434*    Blood type: A/Positive/-- (08/06 0000) Rubella: Immune (08/06 0000)    Physical Exam:  General: alert, cooperative and no distress Uterine Fundus: firm Lochia: appropriate Perineum: repair intact, edema mild DVT Evaluation: Negative Homan's sign. No significant calf/ankle edema.    Assessment/Plan: PPD # 2 / 31 y.o., G1P0101 S/P:vacuum extraction / maternal fatigue, PTL 35+ wks   Principal Problem:  *PP SVD 12/02/11 35wks M    normal postpartum exam  Continue current postpartum care  D/C from in-patient service, plan rooming in tonight if baby not discharged until tomorrow.   LOS: 2 days   Yoshino Broccoli, CNM, MSN 12/04/2011, 7:21 AM

## 2011-12-04 NOTE — Progress Notes (Signed)
PSYCHOSOCIAL ASSESSMENT ~ MATERNAL/CHILD Name:   Angela [name]       Age: 2days    Referral Date: 12/04/11   Reason/Source: Hx of anxiety/panic attacks I. FAMILY/HOME ENVIRONMENT A. Child's Legal Guardian Parent:  Angela [name] Address:  [address], [place], [place] [address]  B. Support Persons:  Family   C.   Other Support: friends II. PSYCHOSOCIAL DATA A. Information Source X Patient Interview    B. 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 Cultural and Environment Information/Cultural Issues Impacting Care:  N/A III. STRENGTHS X Supportive family/friends   X Adequate Resources  X Compliance with medical plan  X Home prepared for Child (including basic supplies)                 X Understanding of illness            IV. RISK FACTORS AND CURRENT PROBLEMS        X No Problems Noted                        V. SOCIAL WORK ASSESSMENT  Met with MOB and baby at bedside.  Infant was late preter.  FOB and family have most of the supplies they need as they were not anticipating baby's arrival prior to their baby shower.  Parents are thrilled with their newborn and their role as parents after having tried to start a family for some time.  MOB is breastfeeding.  She reports receiving  excellent support from the lactation staff as they work to assist baby in his efforts to breast feed successfully as he overcomes the challenges present with his prematurity status.  Mom is very pleased with how things are going, and she is accepting of his care plan.    MOB plans to return to work after her 12 week maternity leave.  FOB will be available to support Mom and baby by working some from home for the first couple weeks after discharge.  MOB reports that they have lots of family and friends who are supportive and plan to help as needed.   MOB reports having stopped taking medication to support her anxiety needs while she was pregnant.  She has worked closely with her care providers, and reports that she can access behavioral health support again at any time if she feels she needs that level of support.  Mom reports feeling very well at this time.  She does not have any concerns for anxiety or depression.  She is aware of signs that may indicate further support may be needed and will watch closely for those signs.  Mom is pleasant, happy, relaxed, articulate and reporting good mood and coping.  She does not report any need for supportive/therapeutic services at this time.  She held baby skin-to-skin during the visit and reported excitement in being a new mom.      VI. SOCIAL WORK PLAN X No Further Intervention Required/No Barriers to Discharge  Staci Acosta, LCSW, 12/04/2011, 11:31 am

## 2011-12-04 NOTE — Discharge Summary (Signed)
Obstetric Discharge Summary Reason for Admission: rupture of membranes, preterm labor 35w 4d Prenatal Procedures: ultrasound Intrapartum Procedures: vacuum and maternal fatigue Postpartum Procedures: TDaP vaccine Complications-Operative and Postpartum: vaginal laceration Hemoglobin  Date Value Range Status  12/03/2011 10.7* 12.0-15.0 (g/dL) Final     DELTA CHECK NOTED     REPEATED TO VERIFY     HCT  Date Value Range Status  12/03/2011 32.6* 36.0-46.0 (%) Final    Discharge Diagnoses: Premature labor - delivered  Discharge Information: Date: 12/04/2011 Activity: pelvic rest Diet: routine Medications: PNV and Ibuprofen Condition: stable Instructions: refer to practice specific booklet Discharge to: home Follow-up Information    Follow up with [facility] A[facility] MD. Schedule an appointment as soon as possible for a visit in 6 weeks.   Contact information:   [address] [place] [address] [phone]          Newborn Data: Live born female  Birth Weight: 6 lb 8.8 oz (2971 g) APGAR: 8, 9  Home with mother.  PAUL,DANIELA 12/04/2011, 7:26 AM

## 2011-12-08 ENCOUNTER — Ambulatory Visit (HOSPITAL_COMMUNITY)
Admission: RE | Admit: 2011-12-08 | Discharge: 2011-12-08 | Disposition: A | Discharge status: Home / Self Care | Payer: 59 | Source: Ambulatory Visit | Attending: Obstetrics & Gynecology | Admitting: Obstetrics & Gynecology

## 2011-12-08 NOTE — Progress Notes (Addendum)
Infant Lactation Consultation Outpatient Visit Note  Patient Name: Angela [name] Date of Birth: 10-14-1981 Birth Weight:  6-8oz  Gestational Age at Delivery: 35 4/7 days  Type of Delivery: 12/02/2011 Vaginal  Today @ consult 74 days old , per mom and dad D/C 2/5 after being a baby pt for 3days on double photo .( today still appears jaundice Per mom and dad no recheck at the [facility] today ( weight at [facility] ), per milk in 2/4 -2/5 ( Today breast full bilaterally right >left because infant fed 20 mins at 1230p prior to consult.  Breastfeeding History Frequency of Breastfeeding: per mom feeding every 2-3 hours 10-15 mins with #16 Nipple Shield ( last night Angela [name] was cluster feeding .  Length of Feeding: 10-15 mins ( mom reports milk yield in Nipple shield after baby releases the breast  Voids: 6-8  Stools:6-8 greenish yellow ( also noted that at consult )   Supplementing / Method: Pumping:  Type of Pump:DEBP Medela at home    Frequency: 3-4 x's in the last 24 hours for 10-15 mins   Volume:  Yielding 50-25ml tops per mom   Comments: mom and dad are working together well and following the verbal plan from the hospital ( they both could give a good history .  Per mom her milk came in 2/4-2/5  Consultation Evaluation:  Initial Feeding Assessment: Pre-feed Weight: 5.12.5oz ( 2622g)  Post-feed Weight:5.13.5 oz( 1610R)  Amount Transferred:66ml  Comments:per mom infant was hungry so she breast fed him at 1230p for 20 mins . For this Latch assessment we woke him up easily by changing his diaper and weighing him. Moms milk in bilaterally , right more compress able then left . Mom able to massage breast ,hand express and pre pump with a hand pump easily , ( observed mom applying #16 nipple shield ) Noted mom needed a review to obtain a better fit . Infant awake , mom able to latch without difficulty ( LC needed to assist to flip infant upper lip open to cover more of the aerolo for  increased stimulation to enhance milk flow. Noted infant obtaining a consistent pattern with multiply swallows . During the feeding reviewed basics especially breast compressions during feedings . Infant ended feeding after 8 mins.   Additional Feeding Assessment: Infant only fed on one breast at consult but had fed 1/2 prior to consult per mom  Pre-feed Weight: Post-feed Weight: Amount Transferred: Comments:  Additional Feeding Assessment: Pre-feed Weight: Post-feed Weight: Amount Transferred: Comments:  Total Breast milk Transferred this Visit: 28ml  Total Supplement Given: None   Additional Interventions:Discussed weight being down , ( @consult  infant fed well .) LC's concern - being a 35 week infant probably doesn't always feed this consistent . LC recommendations for home :1) Encouraged mom to nap , nutritious meals and snacks , drink to thirst 2) Breast shells between feedings to help make the aerolo more compress able so to improve the NS fit . 3) Reviewed engorgement tx 4) Encouraged mom to use the SNS(92ml of EBM)  at a feeding where Angela [name] is sluggish with feeding and when there is a feeding where he isn't just let him feed with the NS. 5) Extra pumping due to the use of a nipple shield , having a 35 week infant ,) extra pumping after feeding for 10 -15 mins 4-6x's in 24 hours ,save milk ( mom aware of proper storage ) . Checked the fit for #20 Nipple shield  and sent with mom so when her breast are less swollen she may have to increase the size .( mom aware of what a good fit should feel and look like .    Follow-Up 1) [facility] appointment for Friday 2/8 at home ( per dad and mom Dr. Rana Snare recommended ) , 2) 12/13/2011 [facility] appointment set up for 1pm at [facility] for F/U feeding assessment . Mom also aware of Tues. Breast feeding support group .also to call PRN for questions or concerns.   Stressed to [employer] and dad as long as a nipple shield is being used a weekly weight check is  recommended once the weight has come up to birth weight . Mom and dad voiced understanding .     Kathrin Greathouse 12/08/2011, 1:48 PM

## 2011-12-13 ENCOUNTER — Ambulatory Visit (HOSPITAL_COMMUNITY)
Admission: RE | Admit: 2011-12-13 | Discharge: 2011-12-13 | Disposition: A | Discharge status: Home / Self Care | Payer: 59 | Source: Ambulatory Visit | Attending: Obstetrics & Gynecology | Admitting: Obstetrics & Gynecology

## 2011-12-13 NOTE — Progress Notes (Signed)
Adult Lactation Consultation Outpatient Visit Note  Patient Name: Angela [name]                              Baby Boy Angela [name], DOB 12/[birth year]  Birth Weight 6lb 8 oz.   Now 32 days old Date of Birth: April 01, 1981                                             Weight at D/C home on 12/07/11  5lb 14 oz Gestational Age at Delivery: Unknown                       Weight at last Peds appointment 12/11/11  5lb 11 1/2 oz.  Type of Delivery: Vacuum Assisted Vaginal Delivery  Breastfeeding History: Frequency of Breastfeeding: 10-12 times a day Length of Feeding: occasionally few minutes to most feedings 20 minutes, Breastfeeding 1 breast each feed Voids: 8-12/day Stools: 3-5/day  Supplementing / Method: Pumping:  Type of Pump:  DEBP  Medela  Free Style   Frequency:  4-7 times/day  Volume:  40-65ml each breast  Comments: Mom is here for follow up from d/c for weight check and feeding assessment with baby using nipple shield and supplementing. Mom has continued to use #20 nipple shield for latch. Parents are supplementing using EBM with curved tipped syringe while baby is at the breast 15-66ml. Parents report baby will not use SNS. Parents report jaundice is improving. Mom reports she is breastfeeding on one breast to be sure the baby gets all the fat milk and empties breast well, then she pumps the other breast. Alternating breast each feed.    Consultation Evaluation: Attempted to latch baby without the nipple shield on right breast, baby was able to obtain the latch, take a few sucks then would lose the latch. After a few attempts, baby was frustrated so we used the nipple shield and the baby nursed well for 15 minutes, with swallows audible and breast milk in end of nipple shield at completion of the feeding. Weight indicated Angela [name] transferred 22 ml of breast milk.  Same experience on the left breast with latch, had to use nipple shield to maintain the latch. Angela [name] nursed for 10 minutes, sounded like we could  hear some swallows, there was milk in the end of the nipple shield but the weight did not indicate Angela [name] had transferred any breast milk. On exam, Angela [name] has a short anterior frenulum with dimpling in the end of the tongue with extension. He does not extend the tongue past the lower lip.  Initial Feeding Assessment: Pre-feed Weight:   5lb 14.5 oz   2680 gm Post-feed Weight:  5lb 15.3 oz   2702 gm Amount Transferred:  22ml Comments:  Right breast using #20 nipple shield nursing for 15 minutes  Additional Feeding Assessment: Pre-feed Weight:  5 lb. 15.3 oz 2702gm Post-feed Weight:  Same Amount Transferred: None Comments:  Left breast with #20 nipple shield nursing for 10 minutes.  Additional Feeding Assessment: Pre-feed Weight: Post-feed Weight: Amount Transferred: Comments:  Total Breast milk Transferred this Visit: 22ml Total Supplement Given:   Additional Interventions: Mom is producing a good supply of breast milk. At this point we discussed supplementing with 25 ml of EBM each feed to average total intake of approx. 45-60  ml. Since it is difficult to supplement this amount with curved tipped syringe and Angela [name] will not take the SNS, parents are going to start using a bottle with slow flow nipple for some supplementations. Discussed suck training with bottle to help stretch the frenulum which may help with the latch. Parents are going to discuss the frenulum with their Pediatrician. May consider consult with oral surgeon Dr. Dutch Quint. At this time, mom will continue to use the nipple shield, breast feed on 1 breast then pump the other or if baby will stay interested may breast feed both breasts each feed. Discussed protecting her milk supply and energy of the baby. [facility] RN will be visiting on 12/15/11.  Follow-Up  Lactation Appt. For Tuesday, 12/21/11 at 1:00.    Angela [name] 12/13/2011, 2:07 PM

## 2011-12-21 ENCOUNTER — Encounter (HOSPITAL_COMMUNITY): Payer: 59

## 2012-04-27 ENCOUNTER — Telehealth: Payer: Self-pay | Admitting: *Deleted

## 2012-04-27 DIAGNOSIS — Z0000 Encounter for general adult medical examination without abnormal findings: Secondary | ICD-10-CM

## 2012-04-27 NOTE — Telephone Encounter (Signed)
Received staff msg pt made cpx for July. Need labs entered... 04/27/12@3 :48pm/LMB

## 2012-04-27 NOTE — Telephone Encounter (Signed)
Message copied by Deatra James on Thu Apr 27, 2012  3:48 PM ------      Message from: COUSIN, SHARON T      Created: Thu Apr 27, 2012  3:32 PM      Regarding: PHY DATE:  05/24/12       THANKS

## 2012-05-16 ENCOUNTER — Other Ambulatory Visit (INDEPENDENT_AMBULATORY_CARE_PROVIDER_SITE_OTHER): Payer: 59

## 2012-05-16 DIAGNOSIS — Z0000 Encounter for general adult medical examination without abnormal findings: Secondary | ICD-10-CM

## 2012-05-16 LAB — CBC WITH DIFFERENTIAL/PLATELET
Eosinophils Absolute: 0.1 10*3/uL (ref 0.0–0.7)
Eosinophils Relative: 1.5 % (ref 0.0–5.0)
Lymphocytes Relative: 31.2 % (ref 12.0–46.0)
MCV: 87.4 fl (ref 78.0–100.0)
Monocytes Absolute: 0.4 10*3/uL (ref 0.1–1.0)
Neutrophils Relative %: 60.1 % (ref 43.0–77.0)
Platelets: 345 10*3/uL (ref 150.0–400.0)
RBC: 4.58 Mil/uL (ref 3.87–5.11)
WBC: 6.7 10*3/uL (ref 4.5–10.5)

## 2012-05-16 LAB — URINALYSIS, ROUTINE W REFLEX MICROSCOPIC
Bilirubin Urine: NEGATIVE
Nitrite: NEGATIVE
pH: 6 (ref 5.0–8.0)

## 2012-05-16 LAB — BASIC METABOLIC PANEL
BUN: 17 mg/dL (ref 6–23)
Calcium: 8.9 mg/dL (ref 8.4–10.5)
Chloride: 107 mEq/L (ref 96–112)
Creatinine, Ser: 0.6 mg/dL (ref 0.4–1.2)

## 2012-05-16 LAB — HEPATIC FUNCTION PANEL
AST: 22 U/L (ref 0–37)
Albumin: 4.3 g/dL (ref 3.5–5.2)
Alkaline Phosphatase: 87 U/L (ref 39–117)
Total Protein: 7.7 g/dL (ref 6.0–8.3)

## 2012-05-16 LAB — LIPID PANEL
Cholesterol: 176 mg/dL (ref 0–200)
HDL: 62.4 mg/dL (ref >39.00)
LDL Cholesterol: 105 mg/dL — ABNORMAL HIGH (ref 0–99)
Total CHOL/HDL Ratio: 3
Triglycerides: 45 mg/dL (ref 0.0–149.0)
VLDL: 9 mg/dL (ref 0.0–40.0)

## 2012-05-17 LAB — TSH: TSH: 1.5 u[IU]/mL (ref 0.35–5.50)

## 2012-05-24 ENCOUNTER — Ambulatory Visit (INDEPENDENT_AMBULATORY_CARE_PROVIDER_SITE_OTHER): Payer: 59 | Admitting: Internal Medicine

## 2012-05-24 ENCOUNTER — Encounter: Payer: Self-pay | Admitting: Internal Medicine

## 2012-05-24 VITALS — BP 102/60 | HR 93 | Temp 98.6°F | Ht 67.0 in | Wt 127.8 lb

## 2012-05-24 DIAGNOSIS — H6120 Impacted cerumen, unspecified ear: Secondary | ICD-10-CM

## 2012-05-24 DIAGNOSIS — Z0000 Encounter for general adult medical examination without abnormal findings: Secondary | ICD-10-CM

## 2012-05-24 DIAGNOSIS — H6121 Impacted cerumen, right ear: Secondary | ICD-10-CM

## 2012-05-24 MED ORDER — SERTRALINE HCL 25 MG PO TABS: 25.0000 mg | ORAL_TABLET | Freq: Every day | ORAL | Status: DC

## 2012-05-24 NOTE — Progress Notes (Signed)
Subjective:    Patient ID: Angela [name], female    DOB: [birth year], 31 y.o.   MRN: [id number]  HPI patient is here today for annual physical. Patient feels well and has no complaints.  Past Medical History  Diagnosis Date  . Abnormal Pap smear     repeat ok  . PCOS (polycystic ovarian syndrome)     conceived on metformin and clomid  . Panic attacks   . PP SVD 12/02/11 M 12/03/2011   Family History  Problem Relation Age of Onset  . Anesthesia problems Neg Hx   . Alcohol abuse Other   . Arthritis Other   . Diabetes Other   . Ovarian cancer Other   . Hypertension Other   . Hyperlipidemia Other    History  Substance Use Topics  . Smoking status: Former Games developer  . Smokeless tobacco: Never Used   Comment: quit 5+yrs ago  . Alcohol Use: No     Review of Systems Constitutional: Negative for fever or weight change.  Respiratory: Negative for cough and shortness of breath.   Cardiovascular: Negative for chest pain or palpitations.  Gastrointestinal: Negative for abdominal pain, no bowel changes.  Musculoskeletal: Negative for gait problem or joint swelling.  Skin: Negative for rash.  Neurological: Negative for dizziness or headache.  No other specific complaints in a complete review of systems (except as listed in HPI above).     Objective:   Physical Exam BP 102/60  Pulse 93  Temp 98.6 F (37 C) (Oral)  Ht 5\' 7"  (1.702 m)  Wt 127 lb 12.8 oz (57.97 kg)  BMI 20.02 kg/m2  SpO2 98% Wt Readings from Last 3 Encounters:  05/24/12 127 lb 12.8 oz (57.97 kg)  12/02/11 150 lb (68.04 kg)  03/06/11 132 lb (59.875 kg)   Constitutional: She appears well-developed and well-nourished. No distress.  HENT: Head: Normocephalic and atraumatic. Ears: R cerumen impaction - after irrigation, B TMs ok, no erythema or effusion; Nose: Nose normal. Mouth/Throat: Oropharynx is clear and moist. No oropharyngeal exudate.  Eyes: Conjunctivae and EOM are normal. Pupils are equal, round, and  reactive to light. No scleral icterus.  Neck: Normal range of motion. Neck supple. No JVD present. No thyromegaly present.  Cardiovascular: Normal rate, regular rhythm and normal heart sounds.  No murmur heard. No BLE edema. Pulmonary/Chest: Effort normal and breath sounds normal. No respiratory distress. She has no wheezes.  Abdominal: Soft. Bowel sounds are normal. She exhibits no distension. There is no tenderness. no masses Musculoskeletal: Normal range of motion, no joint effusions. No gross deformities Neurological: She is alert and oriented to person, place, and time. No cranial nerve deficit. Coordination normal.  Skin: Skin is warm and dry. No rash noted. No erythema.  Psychiatric: She has a normal mood and affect. Her behavior is normal. Judgment and thought content normal.   Lab Results  Component Value Date   WBC 6.7 05/16/2012   HGB 13.3 05/16/2012   HCT 40.0 05/16/2012   PLT 345.0 05/16/2012   GLUCOSE 88 05/16/2012   CHOL 176 05/16/2012   TRIG 45.0 05/16/2012   HDL 62.40 05/16/2012   LDLDIRECT 108.1 01/11/2011   LDLCALC 105* 05/16/2012   ALT 25 05/16/2012   AST 22 05/16/2012   NA 140 05/16/2012   K 3.7 05/16/2012   CL 107 05/16/2012   CREATININE 0.6 05/16/2012   BUN 17 05/16/2012   CO2 25 05/16/2012   TSH 1.50 05/16/2012   Procedure: wax removal Reason: wax impaction  Risks and benefits of procedure discussed with the patient who agrees to proceed. Ear(s) irrigated with warm water. Large amount of wax removed. Instrumentation with metal ear loop was performed to accomplish wax removal. the patient tolerated procedure well.      Assessment & Plan:  CPX/v70.0 - Patient has been counseled on age-appropriate routine health concerns for screening and prevention. These are reviewed and up-to-date. Immunizations are up-to-date or declined. Labs reviewed.  R cerumen impaction - irrigation today as above

## 2012-05-24 NOTE — Patient Instructions (Signed)
It was good to see you today. We have reviewed your prior records including labs and tests today Health Maintenance reviewed - all recommended immunizations and age-appropriate screenings are up-to-date. Your ears have been irrigated of wax today -let us know if continued hearing problems persist for referral to audiologist and hearing testing Medications reviewed, no changes at this time. Refill on medication(s) as discussed today. Please schedule followup in 1 year, call sooner if problems.  Health Maintenance, Females A healthy lifestyle and preventative care can promote health and wellness.  Maintain regular health, dental, and eye exams.   Eat a healthy diet. Foods like vegetables, fruits, whole grains, low-fat dairy products, and lean protein foods contain the nutrients you need without too many calories. Decrease your intake of foods high in solid fats, added sugars, and salt. Get information about a proper diet from your caregiver, if necessary.   Regular physical exercise is one of the most important things you can do for your health. Most adults should get at least 150 minutes of moderate-intensity exercise (any activity that increases your heart rate and causes you to sweat) each week. In addition, most adults need muscle-strengthening exercises on 2 or more days a week.     Maintain a healthy weight. The body mass index (BMI) is a screening tool to identify possible weight problems. It provides an estimate of body fat based on height and weight. Your caregiver can help determine your BMI, and can help you achieve or maintain a healthy weight. For adults 20 years and older:   A BMI below 18.5 is considered underweight.   A BMI of 18.5 to 24.9 is normal.   A BMI of 25 to 29.9 is considered overweight.   A BMI of 30 and above is considered obese.   Maintain normal blood lipids and cholesterol by exercising and minimizing your intake of saturated fat. Eat a balanced diet with  plenty of fruits and vegetables. Blood tests for lipids and cholesterol should begin at age 61 and be repeated every 5 years. If your lipid or cholesterol levels are high, you are over 50, or you are a high risk for heart disease, you may need your cholesterol levels checked more frequently. Ongoing high lipid and cholesterol levels should be treated with medicines if diet and exercise are not effective.   If you smoke, find out from your caregiver how to quit. If you do not use tobacco, do not start.   If you are pregnant, do not drink alcohol. If you are breastfeeding, be very cautious about drinking alcohol. If you are not pregnant and choose to drink alcohol, do not exceed 1 drink per day. One drink is considered to be 12 ounces (355 mL) of beer, 5 ounces (148 mL) of wine, or 1.5 ounces (44 mL) of liquor.   Avoid use of street drugs. Do not share needles with anyone. Ask for help if you need support or instructions about stopping the use of drugs.   High blood pressure causes heart disease and increases the risk of stroke. Blood pressure should be checked at least every 1 to 2 years. Ongoing high blood pressure should be treated with medicines, if weight loss and exercise are not effective.   If you are 20 to 30 years old, ask your caregiver if you should take aspirin to prevent strokes.   Diabetes screening involves taking a blood sample to check your fasting blood sugar level. This should be done once every 3 years, after  age 68, if you are within normal weight and without risk factors for diabetes. Testing should be considered at a younger age or be carried out more frequently if you are overweight and have at least 1 risk factor for diabetes.   Breast cancer screening is essential preventative care for women. You should practice "breast self-awareness." This means understanding the normal appearance and feel of your breasts and may include breast self-examination. Any changes detected, no  matter how small, should be reported to a caregiver. Women in their 80s and 30s should have a clinical breast exam (CBE) by a caregiver as part of a regular health exam every 1 to 3 years. After age 71, women should have a CBE every year. Starting at age 11, women should consider having a mammogram (breast X-ray) every year. Women who have a family history of breast cancer should talk to their caregiver about genetic screening. Women at a high risk of breast cancer should talk to their caregiver about having an MRI and a mammogram every year.   The Pap test is a screening test for cervical cancer. Women should have a Pap test starting at age 24. Between ages 20 and 34, Pap tests should be repeated every 2 years. Beginning at age 7, you should have a Pap test every 3 years as long as the past 3 Pap tests have been normal. If you had a hysterectomy for a problem that was not cancer or a condition that could lead to cancer, then you no longer need Pap tests. If you are between ages 32 and 43, and you have had normal Pap tests going back 10 years, you no longer need Pap tests. If you have had past treatment for cervical cancer or a condition that could lead to cancer, you need Pap tests and screening for cancer for at least 20 years after your treatment. If Pap tests have been discontinued, risk factors (such as a new sexual partner) need to be reassessed to determine if screening should be resumed. Some women have medical problems that increase the chance of getting cervical cancer. In these cases, your caregiver may recommend more frequent screening and Pap tests.   The human papillomavirus (HPV) test is an additional test that may be used for cervical cancer screening. The HPV test looks for the virus that can cause the cell changes on the cervix. The cells collected during the Pap test can be tested for HPV. The HPV test could be used to screen women aged 48 years and older, and should be used in women of any  age who have unclear Pap test results. After the age of 61, women should have HPV testing at the same frequency as a Pap test.   Colorectal cancer can be detected and often prevented. Most routine colorectal cancer screening begins at the age of 11 and continues through age 68. However, your caregiver may recommend screening at an earlier age if you have risk factors for colon cancer. On a yearly basis, your caregiver may provide home test kits to check for hidden blood in the stool. Use of a small camera at the end of a tube, to directly examine the colon (sigmoidoscopy or colonoscopy), can detect the earliest forms of colorectal cancer. Talk to your caregiver about this at age 67, when routine screening begins. Direct examination of the colon should be repeated every 5 to 10 years through age 62, unless early forms of pre-cancerous polyps or small growths are found.  Hepatitis C blood testing is recommended for all people born from [birth year] through 1965 and any individual with known risks for hepatitis C.   Practice safe sex. Use condoms and avoid high-risk sexual practices to reduce the spread of sexually transmitted infections (STIs). Sexually active women aged 49 and younger should be checked for Chlamydia, which is a common sexually transmitted infection. Older women with new or multiple partners should also be tested for Chlamydia. Testing for other STIs is recommended if you are sexually active and at increased risk.   Osteoporosis is a disease in which the bones lose minerals and strength with aging. This can result in serious bone fractures. The risk of osteoporosis can be identified using a bone density scan. Women ages 35 and over and women at risk for fractures or osteoporosis should discuss screening with their caregivers. Ask your caregiver whether you should be taking a calcium supplement or vitamin D to reduce the rate of osteoporosis.   Menopause can be associated with physical symptoms and  risks. Hormone replacement therapy is available to decrease symptoms and risks. You should talk to your caregiver about whether hormone replacement therapy is right for you.   Use sunscreen with a sun protection factor (SPF) of 30 or greater. Apply sunscreen liberally and repeatedly throughout the day. You should seek shade when your shadow is shorter than you. Protect yourself by wearing long sleeves, pants, a wide-brimmed hat, and sunglasses year round, whenever you are outdoors.   Notify your caregiver of new moles or changes in moles, especially if there is a change in shape or color. Also notify your caregiver if a mole is larger than the size of a pencil eraser.   Stay current with your immunizations.  Document Released: 05/03/2011 Document Revised: 10/07/2011 Document Reviewed: 05/03/2011 [facility] Patient Information 2012 [place], [place].

## 2013-05-18 ENCOUNTER — Other Ambulatory Visit: Payer: Self-pay | Admitting: *Deleted

## 2013-05-18 MED ORDER — SERTRALINE HCL 25 MG PO TABS: 25.0000 mg | ORAL_TABLET | Freq: Every day | ORAL | Status: DC

## 2013-05-18 NOTE — Telephone Encounter (Signed)
Left msg had to reschedule appt for 06/13/13. Needing refill on her zoloft until she come in for appt. Called pt back no answer LMOM [facility]...Raechel Chute

## 2013-06-12 ENCOUNTER — Telehealth: Payer: Self-pay | Admitting: *Deleted

## 2013-06-12 ENCOUNTER — Other Ambulatory Visit (INDEPENDENT_AMBULATORY_CARE_PROVIDER_SITE_OTHER): Payer: 59

## 2013-06-12 DIAGNOSIS — Z0000 Encounter for general adult medical examination without abnormal findings: Secondary | ICD-10-CM

## 2013-06-12 LAB — URINALYSIS, ROUTINE W REFLEX MICROSCOPIC
Bilirubin Urine: NEGATIVE
Ketones, ur: NEGATIVE
pH: 6 (ref 5.0–8.0)

## 2013-06-12 LAB — CBC WITH DIFFERENTIAL/PLATELET
Basophils Absolute: 0 10*3/uL (ref 0.0–0.1)
HCT: 40.2 % (ref 36.0–46.0)
Hemoglobin: 13.4 g/dL (ref 12.0–15.0)
Lymphs Abs: 1.8 10*3/uL (ref 0.7–4.0)
MCHC: 33.3 g/dL (ref 30.0–36.0)
MCV: 89.2 fl (ref 78.0–100.0)
Monocytes Relative: 6 % (ref 3.0–12.0)
Neutro Abs: 3.2 10*3/uL (ref 1.4–7.7)
RDW: 13.5 % (ref 11.5–14.6)

## 2013-06-12 LAB — HEPATIC FUNCTION PANEL
ALT: 20 U/L (ref 0–35)
Bilirubin, Direct: 0.1 mg/dL (ref 0.0–0.3)
Total Bilirubin: 0.8 mg/dL (ref 0.3–1.2)

## 2013-06-12 LAB — BASIC METABOLIC PANEL
CO2: 27 mEq/L (ref 19–32)
Chloride: 107 mEq/L (ref 96–112)
Glucose, Bld: 87 mg/dL (ref 70–99)
Potassium: 4.2 mEq/L (ref 3.5–5.1)
Sodium: 139 mEq/L (ref 135–145)

## 2013-06-12 NOTE — Telephone Encounter (Signed)
Pt is in the lab for blood work. No orders in comp. Have cpx schedule for 8/18...lmb

## 2013-06-13 ENCOUNTER — Encounter: Payer: 59 | Admitting: Internal Medicine

## 2013-06-18 ENCOUNTER — Ambulatory Visit (INDEPENDENT_AMBULATORY_CARE_PROVIDER_SITE_OTHER): Payer: 59 | Admitting: Internal Medicine

## 2013-06-18 ENCOUNTER — Encounter: Payer: Self-pay | Admitting: Internal Medicine

## 2013-06-18 VITALS — BP 110/62 | HR 108 | Temp 98.8°F | Ht 67.0 in | Wt 137.0 lb

## 2013-06-18 DIAGNOSIS — Z0000 Encounter for general adult medical examination without abnormal findings: Secondary | ICD-10-CM

## 2013-06-18 MED ORDER — SERTRALINE HCL 25 MG PO TABS: 25.0000 mg | ORAL_TABLET | Freq: Every day | ORAL | Status: DC

## 2013-06-18 NOTE — Progress Notes (Signed)
Subjective:    Patient ID: Angela [name], female    DOB: 01-10-1981, 32 y.o.   MRN: [id number]  HPI  patient is here today for annual physical. Patient feels well and has no complaints.  Past Medical History  Diagnosis Date  . Abnormal Pap smear     repeat ok  . PCOS (polycystic ovarian syndrome)     conceived on metformin and clomid  . Panic attacks   . PP SVD 12/02/11 M 12/03/2011   Family History  Problem Relation Age of Onset  . Anesthesia problems Neg Hx   . Alcohol abuse Other   . Arthritis Other   . Diabetes Father     uncontrolled  . Ovarian cancer Paternal Grandmother   . Hypertension Father   . Hyperlipidemia Father    History  Substance Use Topics  . Smoking status: Former Smoker    Quit date: 11/01/2009  . Smokeless tobacco: Never Used  . Alcohol Use: No     Review of Systems  Constitutional: Negative for fever or weight change.  Respiratory: Negative for cough and shortness of breath.   Cardiovascular: Negative for chest pain or palpitations.  Gastrointestinal: Negative for abdominal pain, no bowel changes.  Musculoskeletal: Negative for gait problem or joint swelling.  Skin: Negative for rash.  Neurological: Negative for dizziness or headache.  No other specific complaints in a complete review of systems (except as listed in HPI above).     Objective:   Physical Exam  BP 110/62  Pulse 108  Temp(Src) 98.8 F (37.1 C) (Oral)  Ht 5\' 7"  (1.702 m)  Wt 137 lb (62.143 kg)  BMI 21.45 kg/m2  SpO2 99% Wt Readings from Last 3 Encounters:  06/18/13 137 lb (62.143 kg)  05/24/12 127 lb 12.8 oz (57.97 kg)  12/02/11 150 lb (68.04 kg)   Constitutional: She appears well-developed and well-nourished. No distress.  HENT: Head: Normocephalic and atraumatic. Ears: B TMs ok, no erythema or effusion; Nose: Nose normal. Mouth/Throat: Oropharynx is clear and moist. No oropharyngeal exudate.  Eyes: Conjunctivae and EOM are normal. Pupils are equal, round, and  reactive to light. No scleral icterus.  Neck: Normal range of motion. Neck supple. No JVD present. No thyromegaly present.  Cardiovascular: Normal rate, regular rhythm and normal heart sounds.  No murmur heard. No BLE edema. Pulmonary/Chest: Effort normal and breath sounds normal. No respiratory distress. She has no wheezes.  Abdominal: Soft. Bowel sounds are normal. She exhibits no distension. There is no tenderness. no masses Musculoskeletal: Normal range of motion, no joint effusions. No gross deformities Neurological: She is alert and oriented to person, place, and time. No cranial nerve deficit. Coordination normal.  Skin: Skin is warm and dry. No rash noted. No erythema.  Psychiatric: She has a normal mood and affect. Her behavior is normal. Judgment and thought content normal.   Lab Results  Component Value Date   WBC 5.4 06/12/2013   HGB 13.4 06/12/2013   HCT 40.2 06/12/2013   PLT 285.0 06/12/2013   GLUCOSE 87 06/12/2013   CHOL 181 06/12/2013   TRIG 58.0 06/12/2013   HDL 61.70 06/12/2013   LDLDIRECT 108.1 01/11/2011   LDLCALC 108* 06/12/2013   ALT 20 06/12/2013   AST 17 06/12/2013   NA 139 06/12/2013   K 4.2 06/12/2013   CL 107 06/12/2013   CREATININE 0.6 06/12/2013   BUN 9 06/12/2013   CO2 27 06/12/2013   TSH 1.61 06/12/2013       Assessment &  Plan:  CPX/v70.0 - Patient has been counseled on age-appropriate routine health concerns for screening and prevention. These are reviewed and up-to-date. Immunizations are up-to-date or declined. Labs reviewed.

## 2013-06-18 NOTE — Patient Instructions (Signed)
It was good to see you today. We have reviewed your prior records including labs and tests today Health Maintenance reviewed - all recommended immunizations and age-appropriate screenings are up-to-date. Medications reviewed and updated, no changes recommended at this time. Refill on medication(s) as discussed today. Please schedule followup in 1 year for annual visit, call sooner if problems.  Health Maintenance, Females A healthy lifestyle and preventative care can promote health and wellness.  Maintain regular health, dental, and eye exams.   Eat a healthy diet. Foods like vegetables, fruits, whole grains, low-fat dairy products, and lean protein foods contain the nutrients you need without too many calories. Decrease your intake of foods high in solid fats, added sugars, and salt. Get information about a proper diet from your caregiver, if necessary.   Regular physical exercise is one of the most important things you can do for your health. Most adults should get at least 150 minutes of moderate-intensity exercise (any activity that increases your heart rate and causes you to sweat) each week. In addition, most adults need muscle-strengthening exercises on 2 or more days a week.     Maintain a healthy weight. The body mass index (BMI) is a screening tool to identify possible weight problems. It provides an estimate of body fat based on height and weight. Your caregiver can help determine your BMI, and can help you achieve or maintain a healthy weight. For adults 20 years and older:   A BMI below 18.5 is considered underweight.   A BMI of 18.5 to 24.9 is normal.   A BMI of 25 to 29.9 is considered overweight.   A BMI of 30 and above is considered obese.   Maintain normal blood lipids and cholesterol by exercising and minimizing your intake of saturated fat. Eat a balanced diet with plenty of fruits and vegetables. Blood tests for lipids and cholesterol should begin at age 54 and be  repeated every 5 years. If your lipid or cholesterol levels are high, you are over 50, or you are a high risk for heart disease, you may need your cholesterol levels checked more frequently. Ongoing high lipid and cholesterol levels should be treated with medicines if diet and exercise are not effective.   If you smoke, find out from your caregiver how to quit. If you do not use tobacco, do not start.   If you are pregnant, do not drink alcohol. If you are breastfeeding, be very cautious about drinking alcohol. If you are not pregnant and choose to drink alcohol, do not exceed 1 drink per day. One drink is considered to be 12 ounces (355 mL) of beer, 5 ounces (148 mL) of wine, or 1.5 ounces (44 mL) of liquor.   Avoid use of street drugs. Do not share needles with anyone. Ask for help if you need support or instructions about stopping the use of drugs.   High blood pressure causes heart disease and increases the risk of stroke. Blood pressure should be checked at least every 1 to 2 years. Ongoing high blood pressure should be treated with medicines, if weight loss and exercise are not effective.   If you are 67 to 31 years old, ask your caregiver if you should take aspirin to prevent strokes.   Diabetes screening involves taking a blood sample to check your fasting blood sugar level. This should be done once every 3 years, after age 27, if you are within normal weight and without risk factors for diabetes. Testing should be  considered at a younger age or be carried out more frequently if you are overweight and have at least 1 risk factor for diabetes.   Breast cancer screening is essential preventative care for women. You should practice "breast self-awareness." This means understanding the normal appearance and feel of your breasts and may include breast self-examination. Any changes detected, no matter how small, should be reported to a caregiver. Women in their 52s and 30s should have a clinical  breast exam (CBE) by a caregiver as part of a regular health exam every 1 to 3 years. After age 56, women should have a CBE every year. Starting at age 53, women should consider having a mammogram (breast X-ray) every year. Women who have a family history of breast cancer should talk to their caregiver about genetic screening. Women at a high risk of breast cancer should talk to their caregiver about having an MRI and a mammogram every year.   The Pap test is a screening test for cervical cancer. Women should have a Pap test starting at age 2. Between ages 66 and 69, Pap tests should be repeated every 2 years. Beginning at age 58, you should have a Pap test every 3 years as long as the past 3 Pap tests have been normal. If you had a hysterectomy for a problem that was not cancer or a condition that could lead to cancer, then you no longer need Pap tests. If you are between ages 59 and 69, and you have had normal Pap tests going back 10 years, you no longer need Pap tests. If you have had past treatment for cervical cancer or a condition that could lead to cancer, you need Pap tests and screening for cancer for at least 20 years after your treatment. If Pap tests have been discontinued, risk factors (such as a new sexual partner) need to be reassessed to determine if screening should be resumed. Some women have medical problems that increase the chance of getting cervical cancer. In these cases, your caregiver may recommend more frequent screening and Pap tests.   The human papillomavirus (HPV) test is an additional test that may be used for cervical cancer screening. The HPV test looks for the virus that can cause the cell changes on the cervix. The cells collected during the Pap test can be tested for HPV. The HPV test could be used to screen women aged 75 years and older, and should be used in women of any age who have unclear Pap test results. After the age of 22, women should have HPV testing at the same  frequency as a Pap test.   Colorectal cancer can be detected and often prevented. Most routine colorectal cancer screening begins at the age of 1 and continues through age 65. However, your caregiver may recommend screening at an earlier age if you have risk factors for colon cancer. On a yearly basis, your caregiver may provide home test kits to check for hidden blood in the stool. Use of a small camera at the end of a tube, to directly examine the colon (sigmoidoscopy or colonoscopy), can detect the earliest forms of colorectal cancer. Talk to your caregiver about this at age 47, when routine screening begins. Direct examination of the colon should be repeated every 5 to 10 years through age 40, unless early forms of pre-cancerous polyps or small growths are found.   Hepatitis C blood testing is recommended for all people born from [birth year] through 1964 and any individual  with known risks for hepatitis C.   Practice safe sex. Use condoms and avoid high-risk sexual practices to reduce the spread of sexually transmitted infections (STIs). Sexually active women aged 25 and younger should be checked for Chlamydia, which is a common sexually transmitted infection. Older women with new or multiple partners should also be tested for Chlamydia. Testing for other STIs is recommended if you are sexually active and at increased risk.   Osteoporosis is a disease in which the bones lose minerals and strength with aging. This can result in serious bone fractures. The risk of osteoporosis can be identified using a bone density scan. Women ages 15 and over and women at risk for fractures or osteoporosis should discuss screening with their caregivers. Ask your caregiver whether you should be taking a calcium supplement or vitamin D to reduce the rate of osteoporosis.   Menopause can be associated with physical symptoms and risks. Hormone replacement therapy is available to decrease symptoms and risks. You should talk to  your caregiver about whether hormone replacement therapy is right for you.   Use sunscreen with a sun protection factor (SPF) of 30 or greater. Apply sunscreen liberally and repeatedly throughout the day. You should seek shade when your shadow is shorter than you. Protect yourself by wearing long sleeves, pants, a wide-brimmed hat, and sunglasses year round, whenever you are outdoors.   Notify your caregiver of new moles or changes in moles, especially if there is a change in shape or color. Also notify your caregiver if a mole is larger than the size of a pencil eraser.   Stay current with your immunizations.  Document Released: 05/03/2011 Document Revised: 10/07/2011 Document Reviewed: 05/03/2011 [facility] Patient Information 2012 [place], [place].

## 2013-08-31 ENCOUNTER — Telehealth: Payer: Self-pay | Admitting: Internal Medicine

## 2013-08-31 NOTE — Telephone Encounter (Signed)
08/31/2013  Pt left message requesting access code for mychart.

## 2013-08-31 NOTE — Telephone Encounter (Signed)
Called pt no answer LMOM activation code prints off on AVS only give you 30 days to sign up. When she come back to see md will give another activation code to get set up...lmb

## 2013-11-14 ENCOUNTER — Ambulatory Visit: Payer: 59 | Admitting: Internal Medicine

## 2013-11-15 ENCOUNTER — Encounter: Payer: Self-pay | Admitting: Internal Medicine

## 2013-11-15 ENCOUNTER — Ambulatory Visit (INDEPENDENT_AMBULATORY_CARE_PROVIDER_SITE_OTHER): Payer: 59 | Admitting: Internal Medicine

## 2013-11-15 VITALS — BP 102/80 | HR 85 | Temp 98.4°F | Wt 132.1 lb

## 2013-11-15 DIAGNOSIS — F329 Major depressive disorder, single episode, unspecified: Secondary | ICD-10-CM

## 2013-11-15 DIAGNOSIS — F3289 Other specified depressive episodes: Secondary | ICD-10-CM

## 2013-11-15 MED ORDER — SERTRALINE HCL 50 MG PO TABS: 50.0000 mg | ORAL_TABLET | Freq: Every day | ORAL | Status: DC

## 2013-11-15 MED ORDER — ALPRAZOLAM 0.5 MG PO TABS: 0.5000 mg | ORAL_TABLET | Freq: Two times a day (BID) | ORAL | Status: DC | PRN

## 2013-11-15 NOTE — Progress Notes (Signed)
Pre-visit discussion using our clinic review tool. No additional management support is needed unless otherwise documented below in the visit note.  

## 2013-11-15 NOTE — Progress Notes (Signed)
Subjective:    Patient ID: Angela [name], female    DOB: 02/09/1981, 33 y.o.   MRN: [id number] Chief Complaint  Patient presents with  . Follow-up    Discuss meds    Anxiety Onset was 6 to 12 months ago. The problem has been gradually worsening. Symptoms include depressed mood, insomnia, irritability, nervous/anxious behavior, palpitations (during anxiety attacks), panic and restlessness. Patient reports no chest pain, confusion, dizziness, muscle tension, nausea, shortness of breath or suicidal ideas. Symptoms occur constantly. The severity of symptoms is interfering with daily activities. The symptoms are aggravated by family issues and work stress. Nighttime awakenings: occasional.   Her past medical history is significant for anxiety/panic attacks and depression. There is no history of hyperthyroidism or suicide attempts. Past treatments include benzodiazephines and SSRIs. The treatment provided moderate relief. Compliance with prior treatments has been good.      Review of Systems  Constitutional: Positive for irritability and fatigue. Negative for fever, chills, diaphoresis, appetite change and unexpected weight change.  Eyes: Negative.   Respiratory: Negative for choking, chest tightness, shortness of breath and wheezing.   Cardiovascular: Positive for palpitations (during anxiety attacks). Negative for chest pain and leg swelling.  Gastrointestinal: Negative for nausea and vomiting.  Skin: Positive for rash (anxiety induced).  Neurological: Negative for dizziness.  Psychiatric/Behavioral: Negative for suicidal ideas, confusion and self-injury. The patient is nervous/anxious and has insomnia.      Past Medical History  Diagnosis Date  . Abnormal Pap smear     repeat ok  . PCOS (polycystic ovarian syndrome)     conceived on metformin and clomid  . Panic attacks   . PP SVD 12/02/11 M 12/03/2011   Past Surgical History  Procedure Laterality Date  . Wisdom tooth extraction      History   Social History  . Marital Status: Married    Spouse Name: N/A    Number of Children: N/A  . Years of Education: N/A   Occupational History  . Not on file.   Social History Main Topics  . Smoking status: Former Smoker    Quit date: 11/01/2009  . Smokeless tobacco: Never Used  . Alcohol Use: No  . Drug Use: No  . Sexual Activity: Yes   Other Topics Concern  . Not on file   Social History Narrative  . No narrative on file   Family History  Problem Relation Age of Onset  . Anesthesia problems Neg Hx   . Alcohol abuse Other   . Arthritis Other   . Diabetes Father     uncontrolled  . Ovarian cancer Paternal Grandmother   . Hypertension Father   . Hyperlipidemia Father    Current Outpatient Prescriptions on File Prior to Visit  Medication Sig Dispense Refill  . Prenatal Vit-Fe Fumarate-FA (PRENATAL MULTIVITAMIN) TABS Take 1 tablet by mouth daily.       No current facility-administered medications on file prior to visit.   No Known Allergies     Objective:   Physical Exam  Nursing note and vitals reviewed. Constitutional: She is oriented to person, place, and time. She appears well-developed and well-nourished.  HENT:  Head: Normocephalic and atraumatic.  Eyes: Conjunctivae and EOM are normal. Pupils are equal, round, and reactive to light.  Neck: No JVD present. No tracheal deviation present.  Cardiovascular: Normal rate, regular rhythm and normal heart sounds.  Exam reveals no gallop and no friction rub.   No murmur heard. Pulmonary/Chest: Effort normal and breath  sounds normal. No respiratory distress. She has no wheezes. She exhibits no tenderness.  Neurological: She is alert and oriented to person, place, and time.  Skin: Skin is warm, dry and intact. Rash (anxiety induced, Upper chest/neck) noted. Rash is urticarial.  Psychiatric: Her speech is normal and behavior is normal. Judgment and thought content normal. Her mood appears anxious. Her  affect is not angry and not blunt. Cognition and memory are normal. She exhibits a depressed mood.   Filed Vitals:   11/15/13 1009  BP: 102/80  Pulse: 85  Temp: 98.4 F (36.9 C)  TempSrc: Oral  Weight: 132 lb 1.9 oz (59.929 kg)  SpO2: 97%    Wt Readings from Last 3 Encounters:  11/15/13 132 lb 1.9 oz (59.929 kg)  06/18/13 137 lb (62.143 kg)  05/24/12 127 lb 12.8 oz (57.97 kg)   Lab Results  Component Value Date   WBC 5.4 06/12/2013   HGB 13.4 06/12/2013   HCT 40.2 06/12/2013   PLT 285.0 06/12/2013   GLUCOSE 87 06/12/2013   CHOL 181 06/12/2013   TRIG 58.0 06/12/2013   HDL 61.70 06/12/2013   LDLDIRECT 108.1 01/11/2011   LDLCALC 108* 06/12/2013   ALT 20 06/12/2013   AST 17 06/12/2013   NA 139 06/12/2013   K 4.2 06/12/2013   CL 107 06/12/2013   CREATININE 0.6 06/12/2013   BUN 9 06/12/2013   CO2 27 06/12/2013   TSH 1.61 06/12/2013   BP Readings from Last 3 Encounters:  11/15/13 102/80  06/18/13 110/62  05/24/12 102/60          Assessment & Plan:  Generalized anxiety disorder - Previous medication proved to be successful until major life events resulted in an increase in anxiety.  Will Increase the dose of sertraline from 25mg  to 50 mg tablet once daily.  Also included Alprazolam 0.5mg  tablets to take as needed for anxiety induced events. Educate patient on family support, Verified social support.    Diagnosis, as well as prescription information and the possible side effect of SI/HI.  Patient was instructed to notify the office if symptoms do not improve or any new symptoms emerge.  Return in about 6 weeks (around 12/27/2013). Jonna CoupBenda, Guy Alan, Student-PA  I have personally reviewed this case with PA student. I also personally examined this patient. I agree with history and findings as documented above. I reviewed, discussed and approve of the assessment and plan as listed above. Rene PaciValerie Leschber, MD

## 2013-11-15 NOTE — Progress Notes (Signed)
   Subjective:    Patient ID: Angela DillonHeather L [name], female    DOB: Feb 28, 1981, 33 y.o.   MRN: [id number]  HPI  complains of increasing anxiety symptoms See PA student note for details  Past Medical History  Diagnosis Date  . Abnormal Pap smear     repeat ok  . PCOS (polycystic ovarian syndrome)     conceived on metformin and clomid  . Panic attacks   . PP SVD 12/02/11 M 12/03/2011    Review of Systems     Objective:   Physical Exam BP 102/80  Pulse 85  Temp(Src) 98.4 F (36.9 C) (Oral)  Wt 132 lb 1.9 oz (59.929 kg)  SpO2 97% Wt Readings from Last 3 Encounters:  11/15/13 132 lb 1.9 oz (59.929 kg)  06/18/13 137 lb (62.143 kg)  05/24/12 127 lb 12.8 oz (57.97 kg)   Constitutional: She appears well-developed and well-nourished. No distress.  Neck: Normal range of motion. Neck supple. No JVD present. No thyromegaly present.  Cardiovascular: Normal rate, regular rhythm and normal heart sounds.  No murmur heard. No BLE edema. Pulmonary/Chest: Effort normal and breath sounds normal. No respiratory distress. She has no wheezes.   Psychiatric: She has an anxious and tearful/depressed mood and affect. Her behavior is appropriate. Judgment and thought content normal.   Lab Results  Component Value Date   WBC 5.4 06/12/2013   HGB 13.4 06/12/2013   HCT 40.2 06/12/2013   PLT 285.0 06/12/2013   GLUCOSE 87 06/12/2013   CHOL 181 06/12/2013   TRIG 58.0 06/12/2013   HDL 61.70 06/12/2013   LDLDIRECT 108.1 01/11/2011   LDLCALC 108* 06/12/2013   ALT 20 06/12/2013   AST 17 06/12/2013   NA 139 06/12/2013   K 4.2 06/12/2013   CL 107 06/12/2013   CREATININE 0.6 06/12/2013   BUN 9 06/12/2013   CO2 27 06/12/2013   TSH 1.61 06/12/2013       Assessment & Plan:   See problem list. Medications and labs reviewed today.

## 2013-11-15 NOTE — Assessment & Plan Note (Signed)
History of same, currently exacerbated by employment stressors Verified no SI/HI Verified existing social support Increase SSRI dose now, provide limited supply of Xanax 0.5 to use as needed Followup in 6-8 weeks in symptoms and titrate meds as needed Patient agrees to call sooner if symptoms unimproved, sooner if worse

## 2013-11-15 NOTE — Patient Instructions (Addendum)
It was good to see you today.  We have reviewed your prior records including labs and tests today  Medications reviewed and updated Increase sertraline 50 mg daily and use low-dose Xanax as needed for anxiety or sleep  Your prescription(s) have been submitted to your pharmacy. Please take as directed and contact our office if you believe you are having problem(s) with the medication(s).  Followup in 6-8 weeks to review symptoms and medications, please call sooner if problems  Depression, Adult Depression refers to feeling sad, low, down in the dumps, blue, gloomy, or empty. In general, there are two kinds of depression: 1. Depression that we all experience from time to time because of upsetting life experiences, including the loss of a job or the ending of a relationship (normal sadness or normal grief). This kind of depression is considered normal, is short lived, and resolves within a few days to 2 weeks. (Depression experienced after the loss of a loved one is called bereavement. Bereavement often lasts longer than 2 weeks but normally gets better with time.) 2. Clinical depression, which lasts longer than normal sadness or normal grief or interferes with your ability to function at home, at work, and in school. It also interferes with your personal relationships. It affects almost every aspect of your life. Clinical depression is an illness. Symptoms of depression also can be caused by conditions other than normal sadness and grief or clinical depression. Examples of these conditions are listed as follows:  Physical illness Some physical illnesses, including underactive thyroid gland (hypothyroidism), severe anemia, specific types of cancer, diabetes, uncontrolled seizures, heart and lung problems, strokes, and chronic pain are commonly associated with symptoms of depression.  Side effects of some prescription medicine In some people, certain types of prescription medicine can cause symptoms  of depression.  Substance abuse Abuse of alcohol and illicit drugs can cause symptoms of depression. SYMPTOMS Symptoms of normal sadness and normal grief include the following:  Feeling sad or crying for short periods of time.  Not caring about anything (apathy).  Difficulty sleeping or sleeping too much.  No longer able to enjoy the things you used to enjoy.  Desire to be by oneself all the time (social isolation).  Lack of energy or motivation.  Difficulty concentrating or remembering.  Change in appetite or weight.  Restlessness or agitation. Symptoms of clinical depression include the same symptoms of normal sadness or normal grief and also the following symptoms:  Feeling sad or crying all the time.  Feelings of guilt or worthlessness.  Feelings of hopelessness or helplessness.  Thoughts of suicide or the desire to harm yourself (suicidal ideation).  Loss of touch with reality (psychotic symptoms). Seeing or hearing things that are not real (hallucinations) or having false beliefs about your life or the people around you (delusions and paranoia). DIAGNOSIS  The diagnosis of clinical depression usually is based on the severity and duration of the symptoms. Your caregiver also will ask you questions about your medical history and substance use to find out if physical illness, use of prescription medicine, or substance abuse is causing your depression. Your caregiver also may order blood tests. TREATMENT  Typically, normal sadness and normal grief do not require treatment. However, sometimes antidepressant medicine is prescribed for bereavement to ease the depressive symptoms until they resolve. The treatment for clinical depression depends on the severity of your symptoms but typically includes antidepressant medicine, counseling with a mental health professional, or a combination of both. Your caregiver will  help to determine what treatment is best for you. Depression  caused by physical illness usually goes away with appropriate medical treatment of the illness. If prescription medicine is causing depression, talk with your caregiver about stopping the medicine, decreasing the dose, or substituting another medicine. Depression caused by abuse of alcohol or illicit drugs abuse goes away with abstinence from these substances. Some adults need professional help in order to stop drinking or using drugs. SEEK IMMEDIATE CARE IF:  You have thoughts about hurting yourself or others.  You lose touch with reality (have psychotic symptoms).  You are taking medicine for depression and have a serious side effect. FOR MORE INFORMATION National Alliance on Mental Illness: www.nami.[employer] National Institute of Mental Health: http://www.maynard.net/www.nimh.nih.gov Document Released: 10/15/2000 Document Revised: 04/18/2012 Document Reviewed: 01/17/2012 [facility] Patient Information 2014 [place], [place].

## 2014-01-01 ENCOUNTER — Ambulatory Visit: Payer: 59 | Admitting: Internal Medicine

## 2014-01-23 ENCOUNTER — Ambulatory Visit: Payer: 59 | Admitting: Internal Medicine

## 2014-07-01 ENCOUNTER — Other Ambulatory Visit: Payer: Self-pay | Admitting: Radiology

## 2014-08-16 ENCOUNTER — Other Ambulatory Visit: Payer: Self-pay

## 2014-09-02 ENCOUNTER — Encounter: Payer: Self-pay | Admitting: Internal Medicine

## 2014-09-12 ENCOUNTER — Encounter: Payer: Self-pay | Admitting: Family

## 2014-09-12 ENCOUNTER — Ambulatory Visit (INDEPENDENT_AMBULATORY_CARE_PROVIDER_SITE_OTHER): Payer: 59 | Admitting: Family

## 2014-09-12 VITALS — BP 118/62 | HR 97 | Temp 97.9°F | Resp 18 | Ht 67.5 in | Wt 133.4 lb

## 2014-09-12 DIAGNOSIS — F419 Anxiety disorder, unspecified: Principal | ICD-10-CM

## 2014-09-12 DIAGNOSIS — F329 Major depressive disorder, single episode, unspecified: Secondary | ICD-10-CM

## 2014-09-12 DIAGNOSIS — F418 Other specified anxiety disorders: Secondary | ICD-10-CM

## 2014-09-12 MED ORDER — ALPRAZOLAM 0.5 MG PO TABS: 0.5000 mg | ORAL_TABLET | Freq: Two times a day (BID) | ORAL | Status: DC | PRN

## 2014-09-12 NOTE — Patient Instructions (Signed)
Thank you for choosing [facility].  Summary/Instructions:   Please continue to take your medications as prescribed.  Plan to follow up in about 6 months or sooner if needed.  Call for refill of Zoloft about 1-2 weeks before needing.

## 2014-09-12 NOTE — Progress Notes (Signed)
Pre visit review using our clinic review tool, if applicable. No additional management support is needed unless otherwise documented below in the visit note. 

## 2014-09-12 NOTE — Progress Notes (Signed)
   Subjective:    Patient ID: Angela [name], female    DOB: [birth year], 33 y.o.   MRN: [id number]  Chief Complaint  Patient presents with  . Medication Refill    xanax? and zoloft    HPI:  Angela [name] is a 33 y.o. female who presents today for follow up of depression.   1) Depression/Anxiety - Recently increased dose of Zoloft. Feels better - does continue to have stress at work.  Denied any side effects of the medication. Denies any weight gain or changes in libido. Originally given a 1 month supply of Xanax which was able to stretch to 3-4 months. Typically used for sleep only when needed. Indicates there are about 3-4 times per month that she needs the medication to help sleep.   No Known Allergies  Current Outpatient Prescriptions on File Prior to Visit  Medication Sig Dispense Refill  . Prenatal Vit-Fe Fumarate-FA (PRENATAL MULTIVITAMIN) TABS Take 1 tablet by mouth daily.    . sertraline (ZOLOFT) 50 MG tablet Take 1 tablet (50 mg total) by mouth daily. 90 tablet 3   No current facility-administered medications on file prior to visit.    Review of Systems    See HPI  Objective:    BP 118/62 mmHg  Pulse 97  Temp(Src) 97.9 F (36.6 C) (Oral)  Resp 18  Ht 5' 7.5" (1.715 m)  Wt 133 lb 6.4 oz (60.51 kg)  BMI 20.57 kg/m2  SpO2 96% Nursing note and vital signs reviewed.  Physical Exam  Constitutional: She is oriented to person, place, and time. She appears well-developed and well-nourished. No distress.  Cardiovascular: Normal rate, regular rhythm, normal heart sounds and intact distal pulses.   Pulmonary/Chest: Effort normal and breath sounds normal.  Neurological: She is alert and oriented to person, place, and time.  Skin: Skin is warm and dry.  Psychiatric: She has a normal mood and affect. Her behavior is normal. Judgment and thought content normal.       Assessment & Plan:

## 2014-09-12 NOTE — Assessment & Plan Note (Signed)
Stable with increased Zoloft. Discussed risks and benefits associated with Xanax and continued use. Would like to continue Xanax as needed. Will attempt 0.25 mg to determine effectiveness. Continue current prescribed Zoloft and Xanax. Follow up in about 6 months or sooner if needed.

## 2014-10-20 ENCOUNTER — Encounter: Payer: Self-pay | Admitting: Emergency Medicine

## 2014-10-20 ENCOUNTER — Emergency Department
Admission: EM | Admit: 2014-10-20 | Discharge: 2014-10-20 | Disposition: A | Discharge status: Home / Self Care | Payer: 59 | Source: Home / Self Care | Attending: Family Medicine | Admitting: Family Medicine

## 2014-10-20 DIAGNOSIS — B029 Zoster without complications: Secondary | ICD-10-CM

## 2014-10-20 DIAGNOSIS — B9789 Other viral agents as the cause of diseases classified elsewhere: Secondary | ICD-10-CM

## 2014-10-20 DIAGNOSIS — J069 Acute upper respiratory infection, unspecified: Secondary | ICD-10-CM

## 2014-10-20 MED ORDER — DOXYCYCLINE HYCLATE 100 MG PO CAPS: 100.0000 mg | ORAL_CAPSULE | Freq: Two times a day (BID) | ORAL | Status: DC

## 2014-10-20 MED ORDER — VALACYCLOVIR HCL 1 G PO TABS: 1000.0000 mg | ORAL_TABLET | Freq: Three times a day (TID) | ORAL | Status: DC

## 2014-10-20 NOTE — ED Notes (Signed)
Reports noticing rash on left ear pinna and left mastoid process x 3 days ago; it is painful and not itching. No new allergens per history.

## 2014-10-20 NOTE — Discharge Instructions (Signed)
If cold symptoms increase, may begin the following:  Take plain Mucinex (1200 mg guaifenesin) twice daily for cough and congestion.  May add Sudafed for sinus congestion.   Increase fluid intake, rest. May use Afrin nasal spray (or generic oxymetazoline) twice daily for about 5 days.  Also recommend using saline nasal spray several times daily and saline nasal irrigation (AYR is a common brand) Try warm salt water gargles for sore throat.  Stop all antihistamines for now, and other non-prescription cough/cold preparations. May take Ibuprofen 200mg , 4 tabs every 8 hours with food for pain, headache, etc. May take Delsym Cough Suppressant at bedtime for nighttime cough.   Follow-up with family doctor if not improving about 7 to10 days.    Shingles Shingles (herpes zoster) is an infection that is caused by the same virus that causes chickenpox (varicella). The infection causes a painful skin rash and fluid-filled blisters, which eventually break open, crust over, and heal. It may occur in any area of the body, but it usually affects only one side of the body or face. The pain of shingles usually lasts about 1 month. However, some people with shingles may develop long-term (chronic) pain in the affected area of the body. Shingles often occurs many years after the person had chickenpox. It is more common:  In people older than 50 years.  In people with weakened immune systems, such as those with HIV, AIDS, or cancer.  In people taking medicines that weaken the immune system, such as transplant medicines.  In people under great stress. CAUSES  Shingles is caused by the varicella zoster virus (VZV), which also causes chickenpox. After a person is infected with the virus, it can remain in the person's body for years in an inactive state (dormant). To cause shingles, the virus reactivates and breaks out as an infection in a nerve root. The virus can be spread from person to person (contagious) through  contact with open blisters of the shingles rash. It will only spread to people who have not had chickenpox. When these people are exposed to the virus, they may develop chickenpox. They will not develop shingles. Once the blisters scab over, the person is no longer contagious and cannot spread the virus to others. SIGNS AND SYMPTOMS  Shingles shows up in stages. The initial symptoms may be pain, itching, and tingling in an area of the skin. This pain is usually described as burning, stabbing, or throbbing.In a few days or weeks, a painful red rash will appear in the area where the pain, itching, and tingling were felt. The rash is usually on one side of the body in a band or belt-like pattern. Then, the rash usually turns into fluid-filled blisters. They will scab over and dry up in approximately 2-3 weeks. Flu-like symptoms may also occur with the initial symptoms, the rash, or the blisters. These may include:  Fever.  Chills.  Headache.  Upset stomach. DIAGNOSIS  Your health care provider will perform a skin exam to diagnose shingles. Skin scrapings or fluid samples may also be taken from the blisters. This sample will be examined under a microscope or sent to a lab for further testing. TREATMENT  There is no specific cure for shingles. Your health care provider will likely prescribe medicines to help you manage the pain, recover faster, and avoid long-term problems. This may include antiviral drugs, anti-inflammatory drugs, and pain medicines. HOME CARE INSTRUCTIONS   Take a cool bath or apply cool compresses to the area of  the rash or blisters as directed. This may help with the pain and itching.   Take medicines only as directed by your health care provider.   Rest as directed by your health care provider.  Keep your rash and blisters clean with mild soap and cool water or as directed by your health care provider.  Do not pick your blisters or scratch your rash. Apply an anti-itch  cream or numbing creams to the affected area as directed by your health care provider.  Keep your shingles rash covered with a loose bandage (dressing).  Avoid skin contact with:  Babies.   Pregnant women.   Children with eczema.   Elderly people with transplants.   People with chronic illnesses, such as leukemia or AIDS.   Wear loose-fitting clothing to help ease the pain of material rubbing against the rash.  Keep all follow-up visits as directed by your health care provider.If the area involved is on your face, you may receive a referral for a specialist, such as an eye doctor (ophthalmologist) or an ear, nose, and throat (ENT) doctor. Keeping all follow-up visits will help you avoid eye problems, chronic pain, or disability.  SEEK IMMEDIATE MEDICAL CARE IF:   You have facial pain, pain around the eye area, or loss of feeling on one side of your face.  You have ear pain or ringing in your ear.  You have loss of taste.  Your pain is not relieved with prescribed medicines.   Your redness or swelling spreads.   You have more pain and swelling.  Your condition is worsening or has changed.   You have a fever. MAKE SURE YOU:  Understand these instructions.  Will watch your condition.  Will get help right away if you are not doing well or get worse. Document Released: 10/18/2005 Document Revised: 03/04/2014 Document Reviewed: 06/01/2012 [facility] Patient Information 2015 [place]ExitCare, [place]LLC. This information is not intended to replace advice given to you by your health care provider. Make sure you discuss any questions you have with your health care provider.

## 2014-10-20 NOTE — ED Provider Notes (Signed)
CSN: [id number]     Arrival date & time 10/20/14  1100 History   First MD Initiated Contact with Patient 10/20/14 1158     Chief Complaint  Patient presents with  . Otalgia  . Abrasion      HPI Comments: Patient noticed a small rash with blisters on her left upper outer ear two days ago.  The ear and surrounding area have been painful.  She recalls no injury to the area. She also complains of a mild cough for about two days with sinus congestion and fatigue.  The history is provided by the patient.    Past Medical History  Diagnosis Date  . Abnormal Pap smear     repeat ok  . PCOS (polycystic ovarian syndrome)     conceived on metformin and clomid  . Panic attacks   . PP SVD 12/02/11 M 12/03/2011   Past Surgical History  Procedure Laterality Date  . Wisdom tooth extraction     Family History  Problem Relation Age of Onset  . Anesthesia problems Neg Hx   . Alcohol abuse Other   . Arthritis Other   . Diabetes Father     uncontrolled  . Ovarian cancer Paternal Grandmother   . Hypertension Father   . Hyperlipidemia Father    History  Substance Use Topics  . Smoking status: Former Smoker    Quit date: 11/01/2009  . Smokeless tobacco: Never Used  . Alcohol Use: No   OB History    Gravida Para Term Preterm AB TAB SAB Ectopic Multiple Living   1 1 0 1 0 0 0 0 0 1      Review of Systems No sore throat + cough No pleuritic pain No wheezing + nasal congestion + post-nasal drainage No sinus pain/pressure No itchy/red eyes ? earache No hemoptysis No SOB No fever/chills No nausea No vomiting No abdominal pain No diarrhea No urinary symptoms No skin rash + fatigue No myalgias No headache Used OTC meds without relief  Allergies  Review of patient's allergies indicates no known allergies.  Home Medications   Prior to Admission medications   Medication Sig Start Date End Date Taking? Authorizing Provider  ALPRAZolam Prudy Feeler(XANAX) 0.5 MG tablet Take 1 tablet (0.5  mg total) by mouth 2 (two) times daily as needed for anxiety or sleep. 09/12/14   Jeanine LuzGregory Calone, FNP  doxycycline (VIBRAMYCIN) 100 MG capsule Take 1 capsule (100 mg total) by mouth 2 (two) times daily. 10/20/14   Lattie HawStephen A Davanna He, MD  sertraline (ZOLOFT) 50 MG tablet Take 1 tablet (50 mg total) by mouth daily. 11/15/13   Newt LukesValerie A Leschber, MD  valACYclovir (VALTREX) 1000 MG tablet Take 1 tablet (1,000 mg total) by mouth 3 (three) times daily. 10/20/14   Lattie HawStephen A Lamesha Tibbits, MD   BP 113/70 mmHg  Pulse 80  Temp(Src) 98 F (36.7 C) (Oral)  Resp 16  Ht 5' 7.5" (1.715 m)  Wt 135 lb (61.236 kg)  BMI 20.82 kg/m2  SpO2 100% Physical Exam  Constitutional: She is oriented to person, place, and time. She appears well-developed and well-nourished. No distress.  HENT:  Head: Normocephalic.    Right Ear: Tympanic membrane and ear canal normal.  Left Ear: Tympanic membrane and ear canal normal.  Ears:  Nose: Nose normal.  Mouth/Throat: Oropharynx is clear and moist.  The superior helix of the left external ear has a herpetiform eruption with tiny vesicles as noted on diagram.  There is tenderness and post-auricular adenopathy present  Eyes: Conjunctivae and EOM are normal. Pupils are equal, round, and reactive to light. Right eye exhibits no discharge. Left eye exhibits no discharge.  Neck: Neck supple.  There is tenderness over posterior cervical nodes  Cardiovascular: Normal heart sounds.   Pulmonary/Chest: Breath sounds normal.  Abdominal: There is no tenderness.  Lymphadenopathy:    She has cervical adenopathy.  Neurological: She is alert and oriented to person, place, and time.  Skin: Skin is warm and dry.  Nursing note and vitals reviewed.   ED Course  Procedures  none     MDM   1. Herpes zoster   2. Viral URI with cough    Begin Valtrex.  Will also empirically begin doxycycline to cover possibility of staph cellulitis. If cold symptoms increase, may begin the  following:  Take plain Mucinex (1200 mg guaifenesin) twice daily for cough and congestion.  May add Sudafed for sinus congestion.   Increase fluid intake, rest. May use Afrin nasal spray (or generic oxymetazoline) twice daily for about 5 days.  Also recommend using saline nasal spray several times daily and saline nasal irrigation (AYR is a common brand) Try warm salt water gargles for sore throat.  Stop all antihistamines for now, and other non-prescription cough/cold preparations. May take Ibuprofen 200mg , 4 tabs every 8 hours with food for pain, headache, etc. May take Delsym Cough Suppressant at bedtime for nighttime cough.  Follow-up with family doctor if not improving about 7 to10 days.     Lattie HawStephen A Dallie Patton, MD 10/28/14 [phone]

## 2014-11-15 ENCOUNTER — Other Ambulatory Visit: Payer: Self-pay | Admitting: Internal Medicine

## 2014-12-16 ENCOUNTER — Other Ambulatory Visit: Payer: Self-pay | Admitting: Family

## 2014-12-19 ENCOUNTER — Other Ambulatory Visit: Payer: Self-pay

## 2014-12-19 MED ORDER — ALPRAZOLAM 0.5 MG PO TABS: 0.5000 mg | ORAL_TABLET | Freq: Two times a day (BID) | ORAL | Status: DC | PRN

## 2015-02-11 ENCOUNTER — Encounter: Payer: Self-pay | Admitting: Family

## 2015-02-11 ENCOUNTER — Ambulatory Visit (INDEPENDENT_AMBULATORY_CARE_PROVIDER_SITE_OTHER): Payer: 59 | Admitting: Family

## 2015-02-11 ENCOUNTER — Other Ambulatory Visit (INDEPENDENT_AMBULATORY_CARE_PROVIDER_SITE_OTHER): Payer: 59

## 2015-02-11 VITALS — BP 128/72 | HR 87 | Temp 98.0°F | Resp 18 | Ht 67.5 in | Wt 134.8 lb

## 2015-02-11 DIAGNOSIS — Z0001 Encounter for general adult medical examination with abnormal findings: Secondary | ICD-10-CM | POA: Insufficient documentation

## 2015-02-11 DIAGNOSIS — R1032 Left lower quadrant pain: Secondary | ICD-10-CM

## 2015-02-11 DIAGNOSIS — Z0000 Encounter for general adult medical examination without abnormal findings: Secondary | ICD-10-CM

## 2015-02-11 LAB — CBC
HCT: 42.3 % (ref 36.0–46.0)
HEMOGLOBIN: 14.5 g/dL (ref 12.0–15.0)
MCHC: 34.3 g/dL (ref 30.0–36.0)
MCV: 86.6 fl (ref 78.0–100.0)
Platelets: 311 10*3/uL (ref 150.0–400.0)
RBC: 4.88 Mil/uL (ref 3.87–5.11)
RDW: 12.7 % (ref 11.5–15.5)
WBC: 6.1 10*3/uL (ref 4.0–10.5)

## 2015-02-11 LAB — BASIC METABOLIC PANEL
BUN: 12 mg/dL (ref 6–23)
CALCIUM: 9.6 mg/dL (ref 8.4–10.5)
CHLORIDE: 104 meq/L (ref 96–112)
CO2: 27 meq/L (ref 19–32)
CREATININE: 0.67 mg/dL (ref 0.40–1.20)
GFR: 107.63 mL/min (ref >60.00)
GLUCOSE: 83 mg/dL (ref 70–99)
Potassium: 3.5 mEq/L (ref 3.5–5.1)
Sodium: 137 mEq/L (ref 135–145)

## 2015-02-11 LAB — LIPID PANEL
CHOL/HDL RATIO: 3
CHOLESTEROL: 197 mg/dL (ref 0–200)
HDL: 65.5 mg/dL (ref >39.00)
LDL CALC: 117 mg/dL — AB (ref 0–99)
NonHDL: 131.5
Triglycerides: 72 mg/dL (ref 0.0–149.0)
VLDL: 14.4 mg/dL (ref 0.0–40.0)

## 2015-02-11 LAB — TSH: TSH: 2.41 u[IU]/mL (ref 0.35–4.50)

## 2015-02-11 MED ORDER — SERTRALINE HCL 50 MG PO TABS: 50.0000 mg | ORAL_TABLET | Freq: Every day | ORAL | Status: DC

## 2015-02-11 NOTE — Progress Notes (Signed)
Subjective:    Patient ID: Angela [name], female    DOB: December 05, 1980, 34 y.o.   MRN: [id number]  Chief Complaint  Patient presents with  . Establish Care    says the only concern she has is a pain that she has in her lower left side that comes and goes that she thinks may be cysts    HPI:  Angela [name] is a 34 y.o. female who presents today for an annual wellness visit.   1) Health Maintenance -   Diet - Averages about 2-3 meals per day; Dairy, vegetables, lean meats, fruits; 3-4 cups of caffeine per day  Exercise - Working to improve; walking/running about a mile at a time 3x per week.    2) Preventative Exams / Immunizations:  Dental -- Up to date  Vision -- Up to date   Health Maintenance  Topic Date Due  . INFLUENZA VACCINE  06/02/2015  . PAP SMEAR  08/02/2015  . TETANUS/TDAP  09/01/2016  . HIV Screening  Completed    Immunization History  Administered Date(s) Administered  . Influenza Whole 08/01/2009  . Influenza,inj,Quad PF,36+ Mos 08/01/2013  . Td 09/01/2006    No Known Allergies   Current Outpatient Prescriptions on File Prior to Visit  Medication Sig Dispense Refill  . ALPRAZolam (XANAX) 0.5 MG tablet Take 1 tablet (0.5 mg total) by mouth 2 (two) times daily as needed for anxiety or sleep. 30 tablet 0  . sertraline (ZOLOFT) 50 MG tablet Take 1 tablet (50 mg total) by mouth daily. 90 tablet 1   No current facility-administered medications on file prior to visit.    Past Medical History  Diagnosis Date  . Abnormal Pap smear     repeat ok  . PCOS (polycystic ovarian syndrome)     conceived on metformin and clomid  . Panic attacks   . PP SVD 12/02/11 M 12/03/2011  . Herpes zoster     Past Surgical History  Procedure Laterality Date  . Wisdom tooth extraction    . Breast biopsy      Family History  Problem Relation Age of Onset  . Anesthesia problems Neg Hx   . Alcohol abuse Other   . Arthritis Other   . Diabetes Father    uncontrolled  . Ovarian cancer Paternal Grandmother   . Hypertension Father   . Hyperlipidemia Father     History   Social History  . Marital Status: Married    Spouse Name: N/A  . Number of Children: N/A  . Years of Education: N/A   Occupational History  . Not on file.   Social History Main Topics  . Smoking status: Former Smoker    Quit date: 11/01/2009  . Smokeless tobacco: Never Used  . Alcohol Use: 1.2 oz/week    2 Cans of beer per week  . Drug Use: No  . Sexual Activity: Yes   Other Topics Concern  . Not on file   Social History Narrative    Review of Systems  Constitutional: Denies fever, chills, fatigue, or significant weight gain/loss. HENT: Head: Denies headache or neck pain Ears: Denies changes in hearing, ringing in ears, earache, drainage Nose: Denies discharge, stuffiness, itching, nosebleed, sinus pain Throat: Denies sore throat, hoarseness, dry mouth, sores, thrush Eyes: Denies loss/changes in vision, pain, redness, blurry/double vision, flashing lights Cardiovascular: Denies chest pain/discomfort, tightness, palpitations, shortness of breath with activity, difficulty lying down, swelling, sudden awakening with shortness of breath Respiratory: Denies shortness of  breath, cough, sputum production, wheezing Gastrointestinal: Denies dysphasia, heartburn, change in appetite, nausea, change in bowel habits, rectal bleeding, constipation, diarrhea, yellow skin or eyes Genitourinary: Denies frequency, urgency, burning/pain, blood in urine, incontinence, change in urinary strength. Some left lower quadrant pain that radiates down to the hip. Pain is manageable.  Musculoskeletal: Denies muscle/joint pain, stiffness, back pain, redness or swelling of joints, trauma Skin: Denies rashes, lumps, itching, dryness, color changes, or hair/nail changes Neurological: Denies dizziness, fainting, seizures, weakness, numbness, tingling, tremor Psychiatric - Denies  nervousness, stress, depression or memory loss Endocrine: Denies heat or cold intolerance, sweating, frequent urination, excessive thirst, changes in appetite Hematologic: Denies ease of bruising or bleeding     Objective:     BP 128/72 mmHg  Pulse 87  Temp(Src) 98 F (36.7 C) (Oral)  Resp 18  Ht 5' 7.5" (1.715 m)  Wt 134 lb 12.8 oz (61.145 kg)  BMI 20.79 kg/m2  SpO2 97% Nursing note and vital signs reviewed.  Physical Exam  Constitutional: She is oriented to person, place, and time. She appears well-developed and well-nourished.  HENT:  Head: Normocephalic.  Right Ear: Hearing, tympanic membrane, external ear and ear canal normal.  Left Ear: Hearing, tympanic membrane, external ear and ear canal normal.  Nose: Nose normal.  Mouth/Throat: Uvula is midline, oropharynx is clear and moist and mucous membranes are normal.  Eyes: Conjunctivae and EOM are normal. Pupils are equal, round, and reactive to light.  Neck: Neck supple. No JVD present. No tracheal deviation present. No thyromegaly present.  Cardiovascular: Normal rate, regular rhythm, normal heart sounds and intact distal pulses.   Pulmonary/Chest: Effort normal and breath sounds normal.  Abdominal: Soft. Bowel sounds are normal. She exhibits no distension and no mass. There is tenderness (LLQ). There is no rebound and no guarding.  Musculoskeletal: Normal range of motion. She exhibits no edema or tenderness.  Lymphadenopathy:    She has no cervical adenopathy.  Neurological: She is alert and oriented to person, place, and time. She has normal reflexes. No cranial nerve deficit. She exhibits normal muscle tone. Coordination normal.  Skin: Skin is warm and dry.  Psychiatric: She has a normal mood and affect. Her behavior is normal. Judgment and thought content normal.       Assessment & Plan:

## 2015-02-11 NOTE — Assessment & Plan Note (Signed)
Lower quadrant pain most likely consistent with an ovarian cyst. Patient will follow-up with GYN if symptoms worsen.

## 2015-02-11 NOTE — Assessment & Plan Note (Addendum)
1) Anticipatory Guidance: Discussed importance of wearing a seatbelt while driving and not texting while driving; changing batteries in smoke detector at least once annually; wearing suntan lotion when outside; eating a balanced and moderate diet; getting physical activity at least 30 minutes per day.  2) Immunizations / Screenings / Labs:  All immunizations are up-to-date per recommendations. All screenings are up-to-date per recommendations. Obtain CBC, BMET, Lipid profile and TSH.    Overall well exam. Patient has minimal risk factors for cardiovascular disease at this time. Continue current healthy lifestyle choices. Follow up prevention exam in 1 year. Follow-up office visit pending lab work.

## 2015-02-11 NOTE — Patient Instructions (Addendum)
Thank you for choosing [facility].  Summary/Instructions:  Your prescription(s) have been submitted to your pharmacy or been printed and provided for you. Please take as directed and contact our office if you believe you are having problem(s) with the medication(s) or have any questions.  Please stop by the lab on the basement level of the building for your blood work. Your results will be released to [place] (or called to you) after review, usually within 72 hours after test completion. If any changes need to be made, you will be notified at that same time.  If your symptoms worsen or fail to improve, please contact our office for further instruction, or in case of emergency go directly to the emergency room at the closest medical facility.   Health Maintenance Adopting a healthy lifestyle and getting preventive care can go a long way to promote health and wellness. Talk with your health care provider about what schedule of regular examinations is right for you. This is a good chance for you to check in with your provider about disease prevention and staying healthy. In between checkups, there are plenty of things you can do on your own. Experts have done a lot of research about which lifestyle changes and preventive measures are most likely to keep you healthy. Ask your health care provider for more information. WEIGHT AND DIET  Eat a healthy diet  Be sure to include plenty of vegetables, fruits, low-fat dairy products, and lean protein.  Do not eat a lot of foods high in solid fats, added sugars, or salt.  Get regular exercise. This is one of the most important things you can do for your health.  Most adults should exercise for at least 150 minutes each week. The exercise should increase your heart rate and make you sweat (moderate-intensity exercise).  Most adults should also do strengthening exercises at least twice a week. This is in addition to the moderate-intensity exercise.   Maintain a healthy weight  Body mass index (BMI) is a measurement that can be used to identify possible weight problems. It estimates body fat based on height and weight. Your health care provider can help determine your BMI and help you achieve or maintain a healthy weight.  For females 31 years of age and older:   A BMI below 18.5 is considered underweight.  A BMI of 18.5 to 24.9 is normal.  A BMI of 25 to 29.9 is considered overweight.  A BMI of 30 and above is considered obese.  Watch levels of cholesterol and blood lipids  You should start having your blood tested for lipids and cholesterol at 33 years of age, then have this test every 5 years.  You may need to have your cholesterol levels checked more often if:  Your lipid or cholesterol levels are high.  You are older than 33 years of age.  You are at high risk for heart disease.  CANCER SCREENING   Lung Cancer  Lung cancer screening is recommended for adults 42-23 years old who are at high risk for lung cancer because of a history of smoking.  A yearly low-dose CT scan of the lungs is recommended for people who:  Currently smoke.  Have quit within the past 15 years.  Have at least a 30-pack-year history of smoking. A pack year is smoking an average of one pack of cigarettes a day for 1 year.  Yearly screening should continue until it has been 15 years since you quit.  Yearly  screening should stop if you develop a health problem that would prevent you from having lung cancer treatment.  Breast Cancer  Practice breast self-awareness. This means understanding how your breasts normally appear and feel.  It also means doing regular breast self-exams. Let your health care provider know about any changes, no matter how small.  If you are in your 20s or 30s, you should have a clinical breast exam (CBE) by a health care provider every 1-3 years as part of a regular health exam.  If you are 11 or older, have a  CBE every year. Also consider having a breast X-ray (mammogram) every year.  If you have a family history of breast cancer, talk to your health care provider about genetic screening.  If you are at high risk for breast cancer, talk to your health care provider about having an MRI and a mammogram every year.  Breast cancer gene (BRCA) assessment is recommended for women who have family members with BRCA-related cancers. BRCA-related cancers include:  Breast.  Ovarian.  Tubal.  Peritoneal cancers.  Results of the assessment will determine the need for genetic counseling and BRCA1 and BRCA2 testing. Cervical Cancer Routine pelvic examinations to screen for cervical cancer are no longer recommended for nonpregnant women who are considered low risk for cancer of the pelvic organs (ovaries, uterus, and vagina) and who do not have symptoms. A pelvic examination may be necessary if you have symptoms including those associated with pelvic infections. Ask your health care provider if a screening pelvic exam is right for you.   The Pap test is the screening test for cervical cancer for women who are considered at risk.  If you had a hysterectomy for a problem that was not cancer or a condition that could lead to cancer, then you no longer need Pap tests.  If you are older than 65 years, and you have had normal Pap tests for the past 10 years, you no longer need to have Pap tests.  If you have had past treatment for cervical cancer or a condition that could lead to cancer, you need Pap tests and screening for cancer for at least 20 years after your treatment.  If you no longer get a Pap test, assess your risk factors if they change (such as having a new sexual partner). This can affect whether you should start being screened again.  Some women have medical problems that increase their chance of getting cervical cancer. If this is the case for you, your health care provider may recommend more  frequent screening and Pap tests.  The human papillomavirus (HPV) test is another test that may be used for cervical cancer screening. The HPV test looks for the virus that can cause cell changes in the cervix. The cells collected during the Pap test can be tested for HPV.  The HPV test can be used to screen women 37 years of age and older. Getting tested for HPV can extend the interval between normal Pap tests from three to five years.  An HPV test also should be used to screen women of any age who have unclear Pap test results.  After 33 years of age, women should have HPV testing as often as Pap tests.  Colorectal Cancer  This type of cancer can be detected and often prevented.  Routine colorectal cancer screening usually begins at 33 years of age and continues through 33 years of age.  Your health care provider may recommend screening at an  earlier age if you have risk factors for colon cancer.  Your health care provider may also recommend using home test kits to check for hidden blood in the stool.  A small camera at the end of a tube can be used to examine your colon directly (sigmoidoscopy or colonoscopy). This is done to check for the earliest forms of colorectal cancer.  Routine screening usually begins at age 76.  Direct examination of the colon should be repeated every 5-10 years through 33 years of age. However, you may need to be screened more often if early forms of precancerous polyps or small growths are found. Skin Cancer  Check your skin from head to toe regularly.  Tell your health care provider about any new moles or changes in moles, especially if there is a change in a mole's shape or color.  Also tell your health care provider if you have a mole that is larger than the size of a pencil eraser.  Always use sunscreen. Apply sunscreen liberally and repeatedly throughout the day.  Protect yourself by wearing long sleeves, pants, a wide-brimmed hat, and sunglasses  whenever you are outside. HEART DISEASE, DIABETES, AND HIGH BLOOD PRESSURE   Have your blood pressure checked at least every 1-2 years. High blood pressure causes heart disease and increases the risk of stroke.  If you are between 6 years and 75 years old, ask your health care provider if you should take aspirin to prevent strokes.  Have regular diabetes screenings. This involves taking a blood sample to check your fasting blood sugar level.  If you are at a normal weight and have a low risk for diabetes, have this test once every three years after 33 years of age.  If you are overweight and have a high risk for diabetes, consider being tested at a younger age or more often. PREVENTING INFECTION  Hepatitis B  If you have a higher risk for hepatitis B, you should be screened for this virus. You are considered at high risk for hepatitis B if:  You were born in a country where hepatitis B is common. Ask your health care provider which countries are considered high risk.  Your parents were born in a high-risk country, and you have not been immunized against hepatitis B (hepatitis B vaccine).  You have HIV or AIDS.  You use needles to inject street drugs.  You live with someone who has hepatitis B.  You have had sex with someone who has hepatitis B.  You get hemodialysis treatment.  You take certain medicines for conditions, including cancer, organ transplantation, and autoimmune conditions. Hepatitis C  Blood testing is recommended for:  Everyone born from [birth year] through 1964.  Anyone with known risk factors for hepatitis C. Sexually transmitted infections (STIs)  You should be screened for sexually transmitted infections (STIs) including gonorrhea and chlamydia if:  You are sexually active and are younger than 33 years of age.  You are older than 33 years of age and your health care provider tells you that you are at risk for this type of infection.  Your sexual activity  has changed since you were last screened and you are at an increased risk for chlamydia or gonorrhea. Ask your health care provider if you are at risk.  If you do not have HIV, but are at risk, it may be recommended that you take a prescription medicine daily to prevent HIV infection. This is called pre-exposure prophylaxis (PrEP). You are considered at risk  if:  You are sexually active and do not regularly use condoms or know the HIV status of your partner(s).  You take drugs by injection.  You are sexually active with a partner who has HIV. Talk with your health care provider about whether you are at high risk of being infected with HIV. If you choose to begin PrEP, you should first be tested for HIV. You should then be tested every 3 months for as long as you are taking PrEP.  PREGNANCY   If you are premenopausal and you may become pregnant, ask your health care provider about preconception counseling.  If you may become pregnant, take 400 to 800 micrograms (mcg) of folic acid every day.  If you want to prevent pregnancy, talk to your health care provider about birth control (contraception). OSTEOPOROSIS AND MENOPAUSE   Osteoporosis is a disease in which the bones lose minerals and strength with aging. This can result in serious bone fractures. Your risk for osteoporosis can be identified using a bone density scan.  If you are 27 years of age or older, or if you are at risk for osteoporosis and fractures, ask your health care provider if you should be screened.  Ask your health care provider whether you should take a calcium or vitamin D supplement to lower your risk for osteoporosis.  Menopause may have certain physical symptoms and risks.  Hormone replacement therapy may reduce some of these symptoms and risks. Talk to your health care provider about whether hormone replacement therapy is right for you.  HOME CARE INSTRUCTIONS   Schedule regular health, dental, and eye  exams.  Stay current with your immunizations.   Do not use any tobacco products including cigarettes, chewing tobacco, or electronic cigarettes.  If you are pregnant, do not drink alcohol.  If you are breastfeeding, limit how much and how often you drink alcohol.  Limit alcohol intake to no more than 1 drink per day for nonpregnant women. One drink equals 12 ounces of beer, 5 ounces of wine, or 1 ounces of hard liquor.  Do not use street drugs.  Do not share needles.  Ask your health care provider for help if you need support or information about quitting drugs.  Tell your health care provider if you often feel depressed.  Tell your health care provider if you have ever been abused or do not feel safe at home. Document Released: 05/03/2011 Document Revised: 03/04/2014 Document Reviewed: 09/19/2013 [facility] Patient Information 2015 [place], [place]. This information is not intended to replace advice given to you by your health care provider. Make sure you discuss any questions you have with your health care provider.

## 2015-02-11 NOTE — Progress Notes (Signed)
Pre visit review using our clinic review tool, if applicable. No additional management support is needed unless otherwise documented below in the visit note. 

## 2015-02-13 ENCOUNTER — Encounter: Payer: Self-pay | Admitting: Family

## 2015-03-03 ENCOUNTER — Encounter: Payer: Self-pay | Admitting: Family

## 2015-03-05 ENCOUNTER — Encounter: Payer: Self-pay | Admitting: Family

## 2015-04-08 ENCOUNTER — Telehealth: Payer: Self-pay | Admitting: Family

## 2015-04-08 NOTE — Telephone Encounter (Signed)
Patient states that she is getting a bill from [facility] for UDS administered. She states her insurance is advising medical necessity needs ot be proven. i gave her the info to [facility] billing, but she wanted me to make you aware.

## 2015-04-08 NOTE — Telephone Encounter (Signed)
Noted - This is per office policy for controlled substances.

## 2015-08-20 ENCOUNTER — Ambulatory Visit (INDEPENDENT_AMBULATORY_CARE_PROVIDER_SITE_OTHER): Payer: 59 | Admitting: Family

## 2015-08-20 ENCOUNTER — Encounter: Payer: Self-pay | Admitting: Family

## 2015-08-20 VITALS — BP 118/80 | HR 93 | Temp 98.2°F | Resp 18 | Ht 67.5 in | Wt 138.0 lb

## 2015-08-20 DIAGNOSIS — F32A Depression, unspecified: Secondary | ICD-10-CM

## 2015-08-20 DIAGNOSIS — F329 Major depressive disorder, single episode, unspecified: Secondary | ICD-10-CM

## 2015-08-20 DIAGNOSIS — F418 Other specified anxiety disorders: Secondary | ICD-10-CM | POA: Diagnosis not present

## 2015-08-20 DIAGNOSIS — F419 Anxiety disorder, unspecified: Principal | ICD-10-CM

## 2015-08-20 MED ORDER — SERTRALINE HCL 50 MG PO TABS: 50.0000 mg | ORAL_TABLET | Freq: Every day | ORAL | Status: DC

## 2015-08-20 MED ORDER — ALPRAZOLAM 0.5 MG PO TABS: 0.5000 mg | ORAL_TABLET | Freq: Two times a day (BID) | ORAL | Status: DC | PRN

## 2015-08-20 NOTE — Patient Instructions (Signed)
Thank you for choosing [facility].  Summary/Instructions:  Please continue to take your medications as prescribed.   Your prescription(s) have been submitted to your pharmacy or been printed and provided for you. Please take as directed and contact our office if you believe you are having problem(s) with the medication(s) or have any questions.  If your symptoms worsen or fail to improve, please contact our office for further instruction, or in case of emergency go directly to the emergency room at the closest medical facility.     

## 2015-08-20 NOTE — Progress Notes (Signed)
   Subjective:    Patient ID: Angela DillonHeather L [name], female    DOB: 1981/06/14, 34 y.o.   MRN: [id number]  Chief Complaint  Patient presents with  . Medication Refill    needs refill of both medications    HPI:  Angela DillonHeather L [name] is a 34 y.o. female who  has a past medical history of Abnormal Pap smear; PCOS (polycystic ovarian syndrome); Panic attacks; PP SVD 12/02/11 M (12/03/2011); and Herpes zoster. and presents today for a follow up office visit.   Anxiety and depression are currently managed with Zoloft and Xanax. Takes the medications as prescribed and denies adverse side effects or suicidal ideations. Reports that her mood is improved and her Xanax helps to regulate her anxiety. She is currently taking the Xanax about once per week.   No Known Allergies   No current outpatient prescriptions on file prior to visit.   No current facility-administered medications on file prior to visit.     Past Surgical History  Procedure Laterality Date  . Wisdom tooth extraction    . Breast biopsy       Review of Systems  Constitutional: Negative for unexpected weight change.  Psychiatric/Behavioral: Negative for decreased concentration. The patient is not nervous/anxious.       Objective:    BP 118/80 mmHg  Pulse 93  Temp(Src) 98.2 F (36.8 C) (Oral)  Resp 18  Ht 5' 7.5" (1.715 m)  Wt 138 lb (62.596 kg)  BMI 21.28 kg/m2  SpO2 97% Nursing note and vital signs reviewed.  Depression screen PHQ 2/9 08/20/2015  Decreased Interest 0  Down, Depressed, Hopeless 0  PHQ - 2 Score 0   Physical Exam  Constitutional: She is oriented to person, place, and time. She appears well-developed and well-nourished. No distress.  Cardiovascular: Normal rate, regular rhythm, normal heart sounds and intact distal pulses.   Pulmonary/Chest: Effort normal and breath sounds normal.  Neurological: She is alert and oriented to person, place, and time.  Skin: Skin is warm and dry.  Psychiatric: She has a  normal mood and affect. Her behavior is normal. Judgment and thought content normal.       Assessment & Plan:   Problem List Items Addressed This Visit      Other   Anxiety and depression - Primary    Symptoms of anxiety and depression are well controlled with current regimen and denies adverse side effects or suicidal ideations. PHQ score is 0. Continue current dose of sertraline and Xanax. Follow up in 6 months or sooner if needed.       Relevant Medications   sertraline (ZOLOFT) 50 MG tablet   ALPRAZolam (XANAX) 0.5 MG tablet

## 2015-08-20 NOTE — Progress Notes (Signed)
Pre visit review using our clinic review tool, if applicable. No additional management support is needed unless otherwise documented below in the visit note. 

## 2015-08-20 NOTE — Assessment & Plan Note (Signed)
Symptoms of anxiety and depression are well controlled with current regimen and denies adverse side effects or suicidal ideations. PHQ score is 0. Continue current dose of sertraline and Xanax. Follow up in 6 months or sooner if needed.

## 2015-11-17 MED FILL — SERTRALINE HCL 50 MG TABLET: 50 | 90 days supply | Qty: 90 | Fill #1

## 2015-11-17 MED FILL — VALACYCLOVIR HCL 500 MG TAB: 500 | 30 days supply | Qty: 30 | Fill #3

## 2015-11-18 MED FILL — ALPRAZolam 0.5 MG TABS: 0.5 | 15 days supply | Qty: 30 | Fill #0

## 2016-01-14 MED FILL — VALACYCLOVIR HCL 500 MG TAB: 500 | 30 days supply | Qty: 30 | Fill #4

## 2016-02-12 MED FILL — VALACYCLOVIR HCL 500 MG TAB: 500 | 30 days supply | Qty: 30 | Fill #5

## 2016-02-18 ENCOUNTER — Ambulatory Visit (INDEPENDENT_AMBULATORY_CARE_PROVIDER_SITE_OTHER): Payer: 59 | Admitting: Family

## 2016-02-18 ENCOUNTER — Encounter: Payer: Self-pay | Admitting: Family

## 2016-02-18 VITALS — BP 108/74 | HR 86 | Temp 98.1°F | Resp 16 | Ht 67.5 in | Wt 139.0 lb

## 2016-02-18 DIAGNOSIS — F329 Major depressive disorder, single episode, unspecified: Secondary | ICD-10-CM

## 2016-02-18 DIAGNOSIS — F418 Other specified anxiety disorders: Secondary | ICD-10-CM

## 2016-02-18 DIAGNOSIS — F32A Depression, unspecified: Secondary | ICD-10-CM

## 2016-02-18 DIAGNOSIS — F419 Anxiety disorder, unspecified: Principal | ICD-10-CM

## 2016-02-18 MED ORDER — SERTRALINE HCL 50 MG PO TABS: 50.0000 mg | ORAL_TABLET | Freq: Every day | ORAL | Status: DC

## 2016-02-18 MED ORDER — ALPRAZOLAM 0.5 MG PO TABS: 0.5000 mg | ORAL_TABLET | Freq: Two times a day (BID) | ORAL | Status: DC | PRN

## 2016-02-18 MED FILL — SERTRALINE HCL 50 MG TABLET: 50 | 90 days supply | Qty: 90 | Fill #0

## 2016-02-18 NOTE — Progress Notes (Signed)
Pre visit review using our clinic review tool, if applicable. No additional management support is needed unless otherwise documented below in the visit note. 

## 2016-02-18 NOTE — Progress Notes (Signed)
Subjective:    Patient ID: Angela [name], female    DOB: 11-30-1980, 35 y.o.   MRN: [id number]  Chief Complaint  Patient presents with  . Follow-up    HPI:  Angela [name] is a 35 y.o. female who  has a past medical history of Abnormal Pap smear; PCOS (polycystic ovarian syndrome); Panic attacks; PP SVD 12/02/11 M (12/03/2011); and Herpes zoster. and presents today for an office follow up.  Anxiety and depression - Currently maintained on sertraline and alprazolam. Reports taking the medication as prescribed and denies adverse side effects. Endorses that her symptoms are well controlled with current medication regimen. Some days are better than others. Xanax use is varies between one time per week or up to 1 time per month. Sleeping on average about 6 hours per night.   No Known Allergies    Outpatient Prescriptions Prior to Visit  Medication Sig Dispense Refill  . ALPRAZolam (XANAX) 0.5 MG tablet Take 1 tablet (0.5 mg total) by mouth 2 (two) times daily as needed for anxiety or sleep. 30 tablet 0  . sertraline (ZOLOFT) 50 MG tablet Take 1 tablet (50 mg total) by mouth daily. 90 tablet 1   No facility-administered medications prior to visit.     Review of Systems  Constitutional: Negative for fever and chills.  Psychiatric/Behavioral: Negative for suicidal ideas, sleep disturbance and dysphoric mood.      Objective:    BP 108/74 mmHg  Pulse 86  Temp(Src) 98.1 F (36.7 C) (Oral)  Resp 16  Ht 5' 7.5" (1.715 m)  Wt 139 lb (63.05 kg)  BMI 21.44 kg/m2  SpO2 97% Nursing note and vital signs reviewed.  Physical Exam  Constitutional: She is oriented to person, place, and time. She appears well-developed and well-nourished. No distress.  Cardiovascular: Normal rate, regular rhythm, normal heart sounds and intact distal pulses.   Pulmonary/Chest: Effort normal and breath sounds normal.  Neurological: She is alert and oriented to person, place, and time.  Skin: Skin is warm  and dry.  Psychiatric: She has a normal mood and affect. Her behavior is normal. Judgment and thought content normal.       Assessment & Plan:   Problem List Items Addressed This Visit      Other   Anxiety and depression - Primary    Anxiety and depression are well controlled with current regimen with no adverse side effects. Denies suicidal ideations. Continue current dosage of alprazolam and sertraline. Follow-up in 6 months or sooner if needed.      Relevant Medications   ALPRAZolam (XANAX) 0.5 MG tablet   sertraline (ZOLOFT) 50 MG tablet       I am having [name] maintain her ALPRAZolam and sertraline.   Meds ordered this encounter  Medications  . ALPRAZolam (XANAX) 0.5 MG tablet    Sig: Take 1 tablet (0.5 mg total) by mouth 2 (two) times daily as needed for anxiety or sleep.    Dispense:  30 tablet    Refill:  0    Order Specific Question:  Supervising Provider    Answer:  Hillard Danker A [[id number]]  . sertraline (ZOLOFT) 50 MG tablet    Sig: Take 1 tablet (50 mg total) by mouth daily.    Dispense:  90 tablet    Refill:  1    Order Specific Question:  Supervising Provider    Answer:  Hillard Danker A [[id number]]     Follow-up: Return in about 6 months (  around 08/19/2016), or if symptoms worsen or fail to improve.  Angela [name], Gregory, FNP

## 2016-02-18 NOTE — Patient Instructions (Signed)
Thank you for choosing [facility].  Summary/Instructions:  Please continue to take your medication as prescribed.   

## 2016-02-18 NOTE — Assessment & Plan Note (Signed)
Anxiety and depression are well controlled with current regimen with no adverse side effects. Denies suicidal ideations. Continue current dosage of alprazolam and sertraline. Follow-up in 6 months or sooner if needed.

## 2016-03-04 DIAGNOSIS — Z0100 Encounter for examination of eyes and vision without abnormal findings: Secondary | ICD-10-CM | POA: Diagnosis not present

## 2016-03-23 MED FILL — VALACYCLOVIR HCL 500 MG TAB: 500 | 30 days supply | Qty: 30 | Fill #6

## 2016-04-22 MED FILL — ALPRAZolam 0.5 MG TABS: 0.5 | 15 days supply | Qty: 30 | Fill #0

## 2016-04-22 MED FILL — VALACYCLOVIR HCL 500 MG TAB: 500 | 30 days supply | Qty: 30 | Fill #7

## 2016-05-16 MED FILL — SERTRALINE HCL 50 MG TABLET: 50 | 90 days supply | Qty: 90 | Fill #1

## 2016-06-14 MED FILL — VALACYCLOVIR HCL 500 MG TAB: 500 | 30 days supply | Qty: 30 | Fill #8

## 2016-06-28 DIAGNOSIS — Z30433 Encounter for removal and reinsertion of intrauterine contraceptive device: Secondary | ICD-10-CM | POA: Diagnosis not present

## 2016-06-28 DIAGNOSIS — Z01419 Encounter for gynecological examination (general) (routine) without abnormal findings: Secondary | ICD-10-CM | POA: Diagnosis not present

## 2016-06-30 DIAGNOSIS — N898 Other specified noninflammatory disorders of vagina: Secondary | ICD-10-CM | POA: Diagnosis not present

## 2016-08-09 ENCOUNTER — Encounter: Payer: Self-pay | Admitting: Family

## 2016-08-09 DIAGNOSIS — Z0000 Encounter for general adult medical examination without abnormal findings: Secondary | ICD-10-CM

## 2016-08-18 ENCOUNTER — Encounter: Payer: Self-pay | Admitting: Family

## 2016-08-18 ENCOUNTER — Ambulatory Visit (INDEPENDENT_AMBULATORY_CARE_PROVIDER_SITE_OTHER): Payer: 59 | Admitting: Family

## 2016-08-18 ENCOUNTER — Other Ambulatory Visit (INDEPENDENT_AMBULATORY_CARE_PROVIDER_SITE_OTHER): Payer: 59

## 2016-08-18 VITALS — BP 110/70 | HR 97 | Temp 98.2°F | Resp 16 | Ht 67.5 in | Wt 138.0 lb

## 2016-08-18 DIAGNOSIS — F32A Depression, unspecified: Secondary | ICD-10-CM

## 2016-08-18 DIAGNOSIS — F329 Major depressive disorder, single episode, unspecified: Secondary | ICD-10-CM

## 2016-08-18 DIAGNOSIS — Z23 Encounter for immunization: Secondary | ICD-10-CM | POA: Diagnosis not present

## 2016-08-18 DIAGNOSIS — F418 Other specified anxiety disorders: Secondary | ICD-10-CM | POA: Diagnosis not present

## 2016-08-18 DIAGNOSIS — F419 Anxiety disorder, unspecified: Secondary | ICD-10-CM

## 2016-08-18 DIAGNOSIS — Z0000 Encounter for general adult medical examination without abnormal findings: Secondary | ICD-10-CM | POA: Diagnosis not present

## 2016-08-18 LAB — LIPID PANEL
CHOL/HDL RATIO: 3
Cholesterol: 217 mg/dL — ABNORMAL HIGH (ref 0–200)
HDL: 63.3 mg/dL (ref >39.00)
LDL Cholesterol: 135 mg/dL — ABNORMAL HIGH (ref 0–99)
NonHDL: 153.22
TRIGLYCERIDES: 91 mg/dL (ref 0.0–149.0)
VLDL: 18.2 mg/dL (ref 0.0–40.0)

## 2016-08-18 LAB — COMPREHENSIVE METABOLIC PANEL
ALK PHOS: 51 U/L (ref 39–117)
ALT: 17 U/L (ref 0–35)
AST: 18 U/L (ref 0–37)
Albumin: 5 g/dL (ref 3.5–5.2)
BILIRUBIN TOTAL: 0.7 mg/dL (ref 0.2–1.2)
BUN: 17 mg/dL (ref 6–23)
CALCIUM: 9.8 mg/dL (ref 8.4–10.5)
CO2: 23 meq/L (ref 19–32)
Chloride: 104 mEq/L (ref 96–112)
Creatinine, Ser: 0.81 mg/dL (ref 0.40–1.20)
GFR: 85.68 mL/min (ref >60.00)
Glucose, Bld: 92 mg/dL (ref 70–99)
POTASSIUM: 4.1 meq/L (ref 3.5–5.1)
Sodium: 138 mEq/L (ref 135–145)
Total Protein: 8.4 g/dL — ABNORMAL HIGH (ref 6.0–8.3)

## 2016-08-18 LAB — CBC
HEMATOCRIT: 43.1 % (ref 36.0–46.0)
HEMOGLOBIN: 14.7 g/dL (ref 12.0–15.0)
MCHC: 34 g/dL (ref 30.0–36.0)
MCV: 87.2 fl (ref 78.0–100.0)
PLATELETS: 287 10*3/uL (ref 150.0–400.0)
RBC: 4.94 Mil/uL (ref 3.87–5.11)
RDW: 13.1 % (ref 11.5–15.5)
WBC: 8 10*3/uL (ref 4.0–10.5)

## 2016-08-18 LAB — VITAMIN D 25 HYDROXY (VIT D DEFICIENCY, FRACTURES): VITD: 45.55 ng/mL (ref 30.00–100.00)

## 2016-08-18 MED ORDER — SERTRALINE HCL 50 MG PO TABS: 50.0000 mg | ORAL_TABLET | Freq: Every day | ORAL | 1 refills | Status: DC

## 2016-08-18 MED FILL — SERTRALINE HCL 50 MG TABLET: 50 | 90 days supply | Qty: 90 | Fill #0

## 2016-08-18 NOTE — Assessment & Plan Note (Addendum)
1) Anticipatory Guidance: Discussed importance of wearing a seatbelt while driving and not texting while driving; changing batteries in smoke detector at least once annually; wearing suntan lotion when outside; eating a balanced and moderate diet; getting physical activity at least 30 minutes per day.  2) Immunizations / Screenings / Labs:  Tetanus updated today. All other immunizations are up-to-date per recommendations. All screenings are up-to-date per recommendations. Blood work previously completed prior to office visit.  Overall well exam with risk factors for cardiovascular disease being minimal at this time. Pending blood work she is overall healthy. Continue healthy lifestyle behaviors and choices. Follow-up prevention exam in 1 year. Follow-up office visit pending blood work as needed

## 2016-08-18 NOTE — Progress Notes (Signed)
Subjective:    Patient ID: Angela [name], female    DOB: 1981-08-31, 35 y.o.   MRN: [id number]  Chief Complaint  Patient presents with  . CPE    labs already done    HPI:  Angela [name] is a 35 y.o. female who presents today for an annual wellness visit.   1) Health Maintenance -   Diet - Averages about 3 meals per day and snacks consisting a regular diet. Caffeine intake of about 3-4 cups per day on average  Exercise - 3x per week   2) Preventative Exams / Immunizations:  Dental -- Up to date  Vision -- Up to date   Health Maintenance  Topic Date Due  . TETANUS/TDAP  09/01/2016  . PAP SMEAR  07/03/2019  . INFLUENZA VACCINE  Completed  . HIV Screening  Completed    Immunization History  Administered Date(s) Administered  . Influenza Whole 08/01/2009  . Influenza,inj,Quad PF,36+ Mos 08/01/2013  . Influenza-Unspecified 08/05/2015  . Td 09/01/2006  . Tdap 08/18/2016     No Known Allergies   Outpatient Medications Prior to Visit  Medication Sig Dispense Refill  . ALPRAZolam (XANAX) 0.5 MG tablet Take 1 tablet (0.5 mg total) by mouth 2 (two) times daily as needed for anxiety or sleep. 30 tablet 0  . sertraline (ZOLOFT) 50 MG tablet Take 1 tablet (50 mg total) by mouth daily. 90 tablet 1   No facility-administered medications prior to visit.      Past Medical History:  Diagnosis Date  . Abnormal Pap smear    repeat ok  . Herpes zoster   . Panic attacks   . PCOS (polycystic ovarian syndrome)    conceived on metformin and clomid  . PP SVD 12/02/11 M 12/03/2011     Past Surgical History:  Procedure Laterality Date  . BREAST BIOPSY    . WISDOM TOOTH EXTRACTION       Family History  Problem Relation Age of Onset  . Anesthesia problems Neg Hx   . Alcohol abuse Other   . Arthritis Other   . Diabetes Father     uncontrolled  . Ovarian cancer Paternal Grandmother   . Hypertension Father   . Hyperlipidemia Father      Social History    Social History  . Marital status: Married    Spouse name: N/A  . Number of children: 1  . Years of education: 74   Occupational History  . Radiation Therapist    Social History Main Topics  . Smoking status: Former Smoker    Quit date: 11/01/2009  . Smokeless tobacco: Never Used  . Alcohol use 1.2 oz/week    2 Cans of beer per week  . Drug use: No  . Sexual activity: Yes   Other Topics Concern  . Not on file   Social History Narrative   Works as a Systems developer; Fun: [name] out with family.    Denies any religious beliefs effecting health care.       Review of Systems  Constitutional: Denies fever, chills, fatigue, or significant weight gain/loss. HENT: Head: Denies headache or neck pain Ears: Denies changes in hearing, ringing in ears, earache, drainage Nose: Denies discharge, stuffiness, itching, nosebleed, sinus pain Throat: Denies sore throat, hoarseness, dry mouth, sores, thrush Eyes: Denies loss/changes in vision, pain, redness, blurry/double vision, flashing lights Cardiovascular: Denies chest pain/discomfort, tightness, palpitations, shortness of breath with activity, difficulty lying down, swelling, sudden awakening with shortness of breath Respiratory:  Denies shortness of breath, cough, sputum production, wheezing Gastrointestinal: Denies dysphasia, heartburn, change in appetite, nausea, change in bowel habits, rectal bleeding, constipation, diarrhea, yellow skin or eyes Genitourinary: Denies frequency, urgency, burning/pain, blood in urine, incontinence, change in urinary strength. Musculoskeletal: Denies muscle/joint pain, stiffness, back pain, redness or swelling of joints, trauma Skin: Denies rashes, lumps, itching, dryness, color changes, or hair/nail changes Neurological: Denies dizziness, fainting, seizures, weakness, numbness, tingling, tremor Psychiatric - Denies nervousness, stress, depression or memory loss Endocrine: Denies heat or cold  intolerance, sweating, frequent urination, excessive thirst, changes in appetite Hematologic: Denies ease of bruising or bleeding     Objective:     BP 110/70 (BP Location: Left Arm, Patient Position: Sitting, Cuff Size: Normal)   Pulse 97   Temp 98.2 F (36.8 C) (Oral)   Resp 16   Ht 5' 7.5" (1.715 m)   Wt 138 lb (62.6 kg)   SpO2 99%   BMI 21.29 kg/m  Nursing note and vital signs reviewed.  Physical Exam  Constitutional: She is oriented to person, place, and time. She appears well-developed and well-nourished.  HENT:  Head: Normocephalic.  Right Ear: Hearing, tympanic membrane, external ear and ear canal normal.  Left Ear: Hearing, tympanic membrane, external ear and ear canal normal.  Nose: Nose normal.  Mouth/Throat: Uvula is midline, oropharynx is clear and moist and mucous membranes are normal.  Eyes: Conjunctivae and EOM are normal. Pupils are equal, round, and reactive to light.  Neck: Neck supple. No JVD present. No tracheal deviation present. No thyromegaly present.  Cardiovascular: Normal rate, regular rhythm, normal heart sounds and intact distal pulses.   Pulmonary/Chest: Effort normal and breath sounds normal.  Abdominal: Soft. Bowel sounds are normal. She exhibits no distension and no mass. There is no tenderness. There is no rebound and no guarding.  Musculoskeletal: Normal range of motion. She exhibits no edema or tenderness.  Lymphadenopathy:    She has no cervical adenopathy.  Neurological: She is alert and oriented to person, place, and time. She has normal reflexes. No cranial nerve deficit. She exhibits normal muscle tone. Coordination normal.  Skin: Skin is warm and dry.  Psychiatric: She has a normal mood and affect. Her behavior is normal. Judgment and thought content normal.       Assessment & Plan:   Problem List Items Addressed This Visit      Other   Anxiety and depression   Relevant Medications   sertraline (ZOLOFT) 50 MG tablet   Routine  general medical examination at a health care facility - Primary    1) Anticipatory Guidance: Discussed importance of wearing a seatbelt while driving and not texting while driving; changing batteries in smoke detector at least once annually; wearing suntan lotion when outside; eating a balanced and moderate diet; getting physical activity at least 30 minutes per day.  2) Immunizations / Screenings / Labs:  Tetanus updated today. All other immunizations are up-to-date per recommendations. All screenings are up-to-date per recommendations. Blood work previously completed prior to office visit.  Overall well exam with risk factors for cardiovascular disease being minimal at this time. Pending blood work she is overall healthy. Continue healthy lifestyle behaviors and choices. Follow-up prevention exam in 1 year. Follow-up office visit pending blood work as needed       Other Visit Diagnoses    Need for Tdap vaccination       Relevant Orders   Tdap vaccine greater than or equal to 7yo IM (Completed)  I am having Ms. Saulnier maintain her ALPRAZolam, valACYclovir, and sertraline.   Meds ordered this encounter  Medications  . valACYclovir (VALTREX) 500 MG tablet    Sig: Take 500 mg by mouth 2 (two) times daily.  . sertraline (ZOLOFT) 50 MG tablet    Sig: Take 1 tablet (50 mg total) by mouth daily.    Dispense:  90 tablet    Refill:  1    Order Specific Question:   Supervising Provider    Answer:   Hillard Danker A [[id number]]     Follow-up: Return in about 1 year (around 08/18/2017), or if symptoms worsen or fail to improve.   Jeanine Luz, FNP

## 2016-08-18 NOTE — Patient Instructions (Addendum)
Thank you for choosing [facility].  SUMMARY AND INSTRUCTIONS:  Medication:  Your prescription(s) have been submitted to your pharmacy or been printed and provided for you. Please take as directed and contact our office if you believe you are having problem(s) with the medication(s) or have any questions.  Follow up:  If your symptoms worsen or fail to improve, please contact our office for further instruction, or in case of emergency go directly to the emergency room at the closest medical facility.   Health Maintenance, Female Adopting a healthy lifestyle and getting preventive care can go a long way to promote health and wellness. Talk with your health care provider about what schedule of regular examinations is right for you. This is a good chance for you to check in with your provider about disease prevention and staying healthy. In between checkups, there are plenty of things you can do on your own. Experts have done a lot of research about which lifestyle changes and preventive measures are most likely to keep you healthy. Ask your health care provider for more information. WEIGHT AND DIET  Eat a healthy diet  Be sure to include plenty of vegetables, fruits, low-fat dairy products, and lean protein.  Do not eat a lot of foods high in solid fats, added sugars, or salt.  Get regular exercise. This is one of the most important things you can do for your health.  Most adults should exercise for at least 150 minutes each week. The exercise should increase your heart rate and make you sweat (moderate-intensity exercise).  Most adults should also do strengthening exercises at least twice a week. This is in addition to the moderate-intensity exercise.  Maintain a healthy weight  Body mass index (BMI) is a measurement that can be used to identify possible weight problems. It estimates body fat based on height and weight. Your health care provider can help determine your BMI and  help you achieve or maintain a healthy weight.  For females 16 years of age and older:   A BMI below 18.5 is considered underweight.  A BMI of 18.5 to 24.9 is normal.  A BMI of 25 to 29.9 is considered overweight.  A BMI of 30 and above is considered obese.  Watch levels of cholesterol and blood lipids  You should start having your blood tested for lipids and cholesterol at 34 years of age, then have this test every 5 years.  You may need to have your cholesterol levels checked more often if:  Your lipid or cholesterol levels are high.  You are older than 34 years of age.  You are at high risk for heart disease.  CANCER SCREENING   Lung Cancer  Lung cancer screening is recommended for adults 87-40 years old who are at high risk for lung cancer because of a history of smoking.  A yearly low-dose CT scan of the lungs is recommended for people who:  Currently smoke.  Have quit within the past 15 years.  Have at least a 30-pack-year history of smoking. A pack year is smoking an average of one pack of cigarettes a day for 1 year.  Yearly screening should continue until it has been 15 years since you quit.  Yearly screening should stop if you develop a health problem that would prevent you from having lung cancer treatment.  Breast Cancer  Practice breast self-awareness. This means understanding how your breasts normally appear and feel.  It also means doing regular breast self-exams. Let  your health care provider know about any changes, no matter how small.  If you are in your 20s or 30s, you should have a clinical breast exam (CBE) by a health care provider every 1-3 years as part of a regular health exam.  If you are 55 or older, have a CBE every year. Also consider having a breast X-ray (mammogram) every year.  If you have a family history of breast cancer, talk to your health care provider about genetic screening.  If you are at high risk for breast cancer, talk  to your health care provider about having an MRI and a mammogram every year.  Breast cancer gene (BRCA) assessment is recommended for women who have family members with BRCA-related cancers. BRCA-related cancers include:  Breast.  Ovarian.  Tubal.  Peritoneal cancers.  Results of the assessment will determine the need for genetic counseling and BRCA1 and BRCA2 testing. Cervical Cancer Your health care provider may recommend that you be screened regularly for cancer of the pelvic organs (ovaries, uterus, and vagina). This screening involves a pelvic examination, including checking for microscopic changes to the surface of your cervix (Pap test). You may be encouraged to have this screening done every 3 years, beginning at age 51.  For women ages 9-65, health care providers may recommend pelvic exams and Pap testing every 3 years, or they may recommend the Pap and pelvic exam, combined with testing for human papilloma virus (HPV), every 5 years. Some types of HPV increase your risk of cervical cancer. Testing for HPV may also be done on women of any age with unclear Pap test results.  Other health care providers may not recommend any screening for nonpregnant women who are considered low risk for pelvic cancer and who do not have symptoms. Ask your health care provider if a screening pelvic exam is right for you.  If you have had past treatment for cervical cancer or a condition that could lead to cancer, you need Pap tests and screening for cancer for at least 20 years after your treatment. If Pap tests have been discontinued, your risk factors (such as having a new sexual partner) need to be reassessed to determine if screening should resume. Some women have medical problems that increase the chance of getting cervical cancer. In these cases, your health care provider may recommend more frequent screening and Pap tests. Colorectal Cancer  This type of cancer can be detected and often  prevented.  Routine colorectal cancer screening usually begins at 34 years of age and continues through 34 years of age.  Your health care provider may recommend screening at an earlier age if you have risk factors for colon cancer.  Your health care provider may also recommend using home test kits to check for hidden blood in the stool.  A small camera at the end of a tube can be used to examine your colon directly (sigmoidoscopy or colonoscopy). This is done to check for the earliest forms of colorectal cancer.  Routine screening usually begins at age 40.  Direct examination of the colon should be repeated every 5-10 years through 34 years of age. However, you may need to be screened more often if early forms of precancerous polyps or small growths are found. Skin Cancer  Check your skin from head to toe regularly.  Tell your health care provider about any new moles or changes in moles, especially if there is a change in a mole's shape or color.  Also tell your  care provider if you have a mole that is larger than the size of a pencil eraser.  Always use sunscreen. Apply sunscreen liberally and repeatedly throughout the day.  Protect yourself by wearing long sleeves, pants, a wide-brimmed hat, and sunglasses whenever you are outside. HEART DISEASE, DIABETES, AND HIGH BLOOD PRESSURE   High blood pressure causes heart disease and increases the risk of stroke. High blood pressure is more likely to develop in:  People who have blood pressure in the high end of the normal range (130-139/85-89 mm Hg).  People who are overweight or obese.  People who are African American.  If you are 18-39 years of age, have your blood pressure checked every 3-5 years. If you are 40 years of age or older, have your blood pressure checked every year. You should have your blood pressure measured twice--once when you are at a hospital or clinic, and once when you are not at a hospital or clinic.  Record the average of the two measurements. To check your blood pressure when you are not at a hospital or clinic, you can use:  An automated blood pressure machine at a pharmacy.  A home blood pressure monitor.  If you are between 55 years and 79 years old, ask your health care provider if you should take aspirin to prevent strokes.  Have regular diabetes screenings. This involves taking a blood sample to check your fasting blood sugar level.  If you are at a normal weight and have a low risk for diabetes, have this test once every three years after 34 years of age.  If you are overweight and have a high risk for diabetes, consider being tested at a younger age or more often. PREVENTING INFECTION  Hepatitis B  If you have a higher risk for hepatitis B, you should be screened for this virus. You are considered at high risk for hepatitis B if:  You were born in a country where hepatitis B is common. Ask your health care provider which countries are considered high risk.  Your parents were born in a high-risk country, and you have not been immunized against hepatitis B (hepatitis B vaccine).  You have HIV or AIDS.  You use needles to inject street drugs.  You live with someone who has hepatitis B.  You have had sex with someone who has hepatitis B.  You get hemodialysis treatment.  You take certain medicines for conditions, including cancer, organ transplantation, and autoimmune conditions. Hepatitis C  Blood testing is recommended for:  Everyone born from 1945 through 1965.  Anyone with known risk factors for hepatitis C. Sexually transmitted infections (STIs)  You should be screened for sexually transmitted infections (STIs) including gonorrhea and chlamydia if:  You are sexually active and are younger than 34 years of age.  You are older than 34 years of age and your health care provider tells you that you are at risk for this type of infection.  Your sexual activity  has changed since you were last screened and you are at an increased risk for chlamydia or gonorrhea. Ask your health care provider if you are at risk.  If you do not have HIV, but are at risk, it may be recommended that you take a prescription medicine daily to prevent HIV infection. This is called pre-exposure prophylaxis (PrEP). You are considered at risk if:  You are sexually active and do not regularly use condoms or know the HIV status of your partner(s).  You take   take drugs by injection.  You are sexually active with a partner who has HIV. Talk with your health care provider about whether you are at high risk of being infected with HIV. If you choose to begin PrEP, you should first be tested for HIV. You should then be tested every 3 months for as long as you are taking PrEP.  PREGNANCY   If you are premenopausal and you may become pregnant, ask your health care provider about preconception counseling.  If you may become pregnant, take 400 to 800 micrograms (mcg) of folic acid every day.  If you want to prevent pregnancy, talk to your health care provider about birth control (contraception). OSTEOPOROSIS AND MENOPAUSE   Osteoporosis is a disease in which the bones lose minerals and strength with aging. This can result in serious bone fractures. Your risk for osteoporosis can be identified using a bone density scan.  If you are 69 years of age or older, or if you are at risk for osteoporosis and fractures, ask your health care provider if you should be screened.  Ask your health care provider whether you should take a calcium or vitamin D supplement to lower your risk for osteoporosis.  Menopause may have certain physical symptoms and risks.  Hormone replacement therapy may reduce some of these symptoms and risks. Talk to your health care provider about whether hormone replacement therapy is right for you.  HOME CARE INSTRUCTIONS   Schedule regular health, dental, and eye  exams.  Stay current with your immunizations.   Do not use any tobacco products including cigarettes, chewing tobacco, or electronic cigarettes.  If you are pregnant, do not drink alcohol.  If you are breastfeeding, limit how much and how often you drink alcohol.  Limit alcohol intake to no more than 1 drink per day for nonpregnant women. One drink equals 12 ounces of beer, 5 ounces of wine, or 1 ounces of hard liquor.  Do not use street drugs.  Do not share needles.  Ask your health care provider for help if you need support or information about quitting drugs.  Tell your health care provider if you often feel depressed.  Tell your health care provider if you have ever been abused or do not feel safe at home.   This information is not intended to replace advice given to you by your health care provider. Make sure you discuss any questions you have with your health care provider.   Document Released: 05/03/2011 Document Revised: 11/08/2014 Document Reviewed: 09/19/2013 Elsevier Interactive Patient Education [employer].

## 2016-10-19 ENCOUNTER — Telehealth: Payer: 59 | Admitting: Family

## 2016-10-19 DIAGNOSIS — B9689 Other specified bacterial agents as the cause of diseases classified elsewhere: Secondary | ICD-10-CM | POA: Diagnosis not present

## 2016-10-19 DIAGNOSIS — J019 Acute sinusitis, unspecified: Secondary | ICD-10-CM

## 2016-10-19 MED ORDER — AMOXICILLIN-POT CLAVULANATE 875-125 MG PO TABS: 1.0000 | ORAL_TABLET | Freq: Two times a day (BID) | ORAL | 0 refills | Status: DC

## 2016-10-19 MED FILL — AMOX TR-K CLV 875-125 MG TA: 875-125 | 7 days supply | Qty: 14 | Fill #0

## 2016-10-19 MED FILL — VALACYCLOVIR HCL 500 MG TAB: 500 | 50 days supply | Qty: 100 | Fill #0

## 2016-10-19 NOTE — Progress Notes (Signed)

## 2016-10-19 NOTE — Addendum Note (Signed)
Addended by: Worthy RancherWEBB, Jadia Capers B on: 10/19/2016 10:57 AM   Modules accepted: Orders

## 2016-10-20 ENCOUNTER — Ambulatory Visit: Payer: Self-pay | Admitting: Family

## 2016-11-10 DIAGNOSIS — R109 Unspecified abdominal pain: Secondary | ICD-10-CM | POA: Diagnosis not present

## 2016-11-10 DIAGNOSIS — R102 Pelvic and perineal pain: Secondary | ICD-10-CM | POA: Diagnosis not present

## 2016-11-10 DIAGNOSIS — N898 Other specified noninflammatory disorders of vagina: Secondary | ICD-10-CM | POA: Diagnosis not present

## 2016-11-10 DIAGNOSIS — N83201 Unspecified ovarian cyst, right side: Secondary | ICD-10-CM | POA: Diagnosis not present

## 2016-11-10 DIAGNOSIS — Z3200 Encounter for pregnancy test, result unknown: Secondary | ICD-10-CM | POA: Diagnosis not present

## 2016-11-10 DIAGNOSIS — R1031 Right lower quadrant pain: Secondary | ICD-10-CM | POA: Diagnosis not present

## 2016-11-12 MED FILL — SERTRALINE HCL 50 MG TABLET: 50 | 90 days supply | Qty: 90 | Fill #1

## 2016-12-21 DIAGNOSIS — R102 Pelvic and perineal pain: Secondary | ICD-10-CM | POA: Diagnosis not present

## 2016-12-21 DIAGNOSIS — N83201 Unspecified ovarian cyst, right side: Secondary | ICD-10-CM | POA: Diagnosis not present

## 2017-01-21 ENCOUNTER — Encounter: Payer: Self-pay | Admitting: Family

## 2017-02-07 ENCOUNTER — Encounter: Payer: Self-pay | Admitting: Family

## 2017-02-07 ENCOUNTER — Ambulatory Visit (INDEPENDENT_AMBULATORY_CARE_PROVIDER_SITE_OTHER): Payer: 59 | Admitting: Family

## 2017-02-07 ENCOUNTER — Other Ambulatory Visit (INDEPENDENT_AMBULATORY_CARE_PROVIDER_SITE_OTHER): Payer: 59

## 2017-02-07 DIAGNOSIS — R5383 Other fatigue: Secondary | ICD-10-CM | POA: Insufficient documentation

## 2017-02-07 DIAGNOSIS — F329 Major depressive disorder, single episode, unspecified: Secondary | ICD-10-CM

## 2017-02-07 DIAGNOSIS — F418 Other specified anxiety disorders: Secondary | ICD-10-CM | POA: Diagnosis not present

## 2017-02-07 DIAGNOSIS — F419 Anxiety disorder, unspecified: Secondary | ICD-10-CM

## 2017-02-07 LAB — C-REACTIVE PROTEIN: CRP: 0.1 mg/dL — AB (ref 0.5–20.0)

## 2017-02-07 LAB — TSH: TSH: 3.54 u[IU]/mL (ref 0.35–4.50)

## 2017-02-07 MED ORDER — SERTRALINE HCL 50 MG PO TABS: 50.0000 mg | ORAL_TABLET | Freq: Every day | ORAL | 1 refills | Status: DC

## 2017-02-07 MED ORDER — ALPRAZOLAM 0.5 MG PO TABS: 0.5000 mg | ORAL_TABLET | Freq: Two times a day (BID) | ORAL | 1 refills | Status: DC | PRN

## 2017-02-07 MED FILL — SERTRALINE HCL 50 MG TABLET: 50 | 90 days supply | Qty: 90 | Fill #0

## 2017-02-07 NOTE — Patient Instructions (Signed)
Thank you for choosing [facility].  SUMMARY AND INSTRUCTIONS:  Please continue to take your medications as prescribed.   We will check on your fatigue.  Medication:  Your prescription(s) have been submitted to your pharmacy or been printed and provided for you. Please take as directed and contact our office if you believe you are having problem(s) with the medication(s) or have any questions.  Labs:  Please stop by the lab on the lower level of the building for your blood work. Your results will be released to MyChart (or called to you) after review, usually within 72 hours after test completion. If any changes need to be made, you will be notified at that same time.  1.) The lab is open from 7:30am to 5:30 pm Monday-Friday 2.) No appointment is necessary 3.) Fasting (if needed) is 6-8 hours after food and drink; black coffee and water are okay   Follow up:  If your symptoms worsen or fail to improve, please contact our office for further instruction, or in case of emergency go directly to the emergency room at the closest medical facility.

## 2017-02-07 NOTE — Assessment & Plan Note (Addendum)
Fatigue with questionable origine between metabolic, psychological, cardiovascular or sleep origin. Obtain TSH, CRP, ANA and RF to rule out metabolic causes. Unlikely cardiovascular disease given decreased risk factors. Cannot rule out depression or sleep disturbance. There is concern for possible rheumatoid arthritis given family history. Continue with conservative treatment with OTC medications as needed and follow up pending blood work results.

## 2017-02-07 NOTE — Progress Notes (Signed)
Subjective:    Patient ID: Angela [name], female    DOB: [birth year], 36 y.o.   MRN: [id number]  Chief Complaint  Patient presents with  . Medication Refill    HPI:  Angela [name] is a 36 y.o. female who  has a past medical history of Abnormal Pap smear; Herpes zoster; Panic attacks; PCOS (polycystic ovarian syndrome); and PP SVD 12/02/11 M (12/03/2011). and presents today for a follow up office visit.  1.) Anxiety - Currently maintained on sertraline and alprazolam. Reports taking the medication as prescribed and denies adverse side effects. Symptoms are generally well controlled with the current medication regimen. Notes that she used the alprazolam very infrequently.   2.) Fatigue / Joint pains - This is a new problem. Associated symptoms of fatigue and joint pains located in her knees, hips, wrists and knuckles that has been going on for about 1 month. Generally takes about 3-4 hours of movement before the soreness goes away. Sleeping is adequate and sometimes feels well rested when she wakes. No increased urinary freqeuncy. Patient reports significant family history or RA in her grandmother.    No Known Allergies    Outpatient Medications Prior to Visit  Medication Sig Dispense Refill  . valACYclovir (VALTREX) 500 MG tablet Take 500 mg by mouth 2 (two) times daily.    Marland Kitchen ALPRAZolam (XANAX) 0.5 MG tablet Take 1 tablet (0.5 mg total) by mouth 2 (two) times daily as needed for anxiety or sleep. 30 tablet 0  . amoxicillin-clavulanate (AUGMENTIN) 875-125 MG tablet Take 1 tablet by mouth 2 (two) times daily. 14 tablet 0  . sertraline (ZOLOFT) 50 MG tablet Take 1 tablet (50 mg total) by mouth daily. 90 tablet 1   No facility-administered medications prior to visit.       Past Surgical History:  Procedure Laterality Date  . BREAST BIOPSY    . WISDOM TOOTH EXTRACTION        Past Medical History:  Diagnosis Date  . Abnormal Pap smear    repeat ok  . Herpes zoster   .  Panic attacks   . PCOS (polycystic ovarian syndrome)    conceived on metformin and clomid  . PP SVD 12/02/11 M 12/03/2011      Review of Systems  Constitutional: Negative for chills and fever.  Respiratory: Negative for chest tightness and shortness of breath.   Psychiatric/Behavioral: Negative for dysphoric mood and sleep disturbance. The patient is not nervous/anxious.       Objective:    BP 128/76   Pulse 98   Temp 98.3 F (36.8 C) (Oral)   Ht  (1.702 m)   Wt 145 lb (65.8 kg)   SpO2 98%   BMI 22.71 kg/m  Nursing note and vital signs reviewed.  Physical Exam  Constitutional: She is oriented to person, place, and time. She appears well-developed and well-nourished. No distress.  Cardiovascular: Normal rate, regular rhythm, normal heart sounds and intact distal pulses.   Pulmonary/Chest: Effort normal and breath sounds normal.  Musculoskeletal:  Multiple joints with no obvious deformity, discoloration or edema. Range of motion appears within normal limits and good strength.   Neurological: She is alert and oriented to person, place, and time.  Skin: Skin is warm and dry.  Psychiatric: She has a normal mood and affect. Her behavior is normal. Judgment and thought content normal.       Assessment & Plan:   Problem List Items Addressed This Visit  Other   Anxiety and depression    Anxiety and depression periodically controlled current medication regimen and no adverse side effects or suicidal ideations. Continue current dosage of sertraline and alprazolam. [place] controlled substance database reviewed with no irregularities. Continue to monitor.      Relevant Medications   sertraline (ZOLOFT) 50 MG tablet   ALPRAZolam (XANAX) 0.5 MG tablet   Fatigue    Fatigue with questionable origine between metabolic, psychological, cardiovascular or sleep origin. Obtain TSH, CRP, ANA and RF to rule out metabolic causes. Unlikely cardiovascular disease given decreased  risk factors. Cannot rule out depression or sleep disturbance. There is concern for possible rheumatoid arthritis given family history. Continue with conservative treatment with OTC medications as needed and follow up pending blood work results.       Relevant Orders   Rheumatoid Factor   TSH (Completed)   Antinuclear Antib (ANA)   C-reactive protein (Completed)       I have discontinued Ms. Lager's amoxicillin-clavulanate. I am also having her maintain her valACYclovir, sertraline, and ALPRAZolam.   Meds ordered this encounter  Medications  . sertraline (ZOLOFT) 50 MG tablet    Sig: Take 1 tablet (50 mg total) by mouth daily.    Dispense:  90 tablet    Refill:  1    Order Specific Question:   Supervising Provider    Answer:   Hillard Danker A [[id number]]  . ALPRAZolam (XANAX) 0.5 MG tablet    Sig: Take 1 tablet (0.5 mg total) by mouth 2 (two) times daily as needed for anxiety or sleep.    Dispense:  30 tablet    Refill:  1    Order Specific Question:   Supervising Provider    Answer:   Hillard Danker A [[id number]]     Follow-up: Return if symptoms worsen or fail to improve.  Jeanine Luz, FNP

## 2017-02-07 NOTE — Progress Notes (Signed)
Pre visit review using our clinic review tool, if applicable. No additional management support is needed unless otherwise documented below in the visit note. 

## 2017-02-07 NOTE — Assessment & Plan Note (Signed)
Anxiety and depression periodically controlled current medication regimen and no adverse side effects or suicidal ideations. Continue current dosage of sertraline and alprazolam. [place] controlled substance database reviewed with no irregularities. Continue to monitor.

## 2017-02-08 ENCOUNTER — Other Ambulatory Visit: Payer: Self-pay | Admitting: Family

## 2017-02-08 ENCOUNTER — Encounter: Payer: Self-pay | Admitting: Family

## 2017-02-08 DIAGNOSIS — R768 Other specified abnormal immunological findings in serum: Secondary | ICD-10-CM

## 2017-02-08 LAB — RHEUMATOID FACTOR

## 2017-02-08 LAB — ANA: Anti Nuclear Antibody(ANA): POSITIVE — AB

## 2017-02-08 LAB — ANTI-NUCLEAR AB-TITER (ANA TITER): ANA Titer 1: 1:160 {titer} — ABNORMAL HIGH

## 2017-02-08 MED FILL — VALACYCLOVIR HCL 500 MG TAB: 500 | 50 days supply | Qty: 100 | Fill #1

## 2017-02-08 MED FILL — ALPRAZolam 0.5 MG TABS: 0.5 | 15 days supply | Qty: 30 | Fill #0

## 2017-02-08 NOTE — Progress Notes (Unsigned)
Refer

## 2017-03-01 ENCOUNTER — Encounter: Payer: Self-pay | Admitting: Family

## 2017-03-07 ENCOUNTER — Ambulatory Visit: Payer: Self-pay | Admitting: Family

## 2017-03-08 ENCOUNTER — Telehealth: Payer: Self-pay | Admitting: *Deleted

## 2017-03-08 NOTE — Telephone Encounter (Signed)
Email received regarding patient having a flare. She has a schedule appointment with another rheumatologist in approximately 2 weeks. Patient has not had a referral sent to our office and is not currently a patient of ours. Left message to advise patient that our first available new patient appointment in May 05, 2017.

## 2017-03-21 DIAGNOSIS — M2550 Pain in unspecified joint: Secondary | ICD-10-CM | POA: Diagnosis not present

## 2017-03-21 DIAGNOSIS — Z6822 Body mass index (BMI) 22.0-22.9, adult: Secondary | ICD-10-CM | POA: Diagnosis not present

## 2017-03-21 DIAGNOSIS — R5382 Chronic fatigue, unspecified: Secondary | ICD-10-CM | POA: Diagnosis not present

## 2017-03-21 DIAGNOSIS — R768 Other specified abnormal immunological findings in serum: Secondary | ICD-10-CM | POA: Diagnosis not present

## 2017-03-31 ENCOUNTER — Encounter: Payer: Self-pay | Admitting: Neurology

## 2017-03-31 ENCOUNTER — Ambulatory Visit (INDEPENDENT_AMBULATORY_CARE_PROVIDER_SITE_OTHER)
Admission: RE | Admit: 2017-03-31 | Discharge: 2017-03-31 | Disposition: A | Discharge status: Home / Self Care | Payer: 59 | Source: Ambulatory Visit | Attending: Family | Admitting: Family

## 2017-03-31 ENCOUNTER — Other Ambulatory Visit (INDEPENDENT_AMBULATORY_CARE_PROVIDER_SITE_OTHER): Payer: 59

## 2017-03-31 ENCOUNTER — Ambulatory Visit (INDEPENDENT_AMBULATORY_CARE_PROVIDER_SITE_OTHER): Payer: 59 | Admitting: Family

## 2017-03-31 ENCOUNTER — Encounter: Payer: Self-pay | Admitting: Family

## 2017-03-31 VITALS — BP 116/78 | HR 81 | Temp 98.9°F | Resp 16 | Ht 67.0 in | Wt 147.8 lb

## 2017-03-31 DIAGNOSIS — M62838 Other muscle spasm: Secondary | ICD-10-CM

## 2017-03-31 DIAGNOSIS — M2550 Pain in unspecified joint: Secondary | ICD-10-CM

## 2017-03-31 DIAGNOSIS — M542 Cervicalgia: Secondary | ICD-10-CM | POA: Diagnosis not present

## 2017-03-31 DIAGNOSIS — M25519 Pain in unspecified shoulder: Secondary | ICD-10-CM | POA: Diagnosis not present

## 2017-03-31 LAB — COMPREHENSIVE METABOLIC PANEL
ALBUMIN: 4.8 g/dL (ref 3.5–5.2)
ALT: 21 U/L (ref 0–35)
AST: 19 U/L (ref 0–37)
Alkaline Phosphatase: 50 U/L (ref 39–117)
BUN: 13 mg/dL (ref 6–23)
CHLORIDE: 106 meq/L (ref 96–112)
CO2: 27 mEq/L (ref 19–32)
CREATININE: 0.8 mg/dL (ref 0.40–1.20)
Calcium: 9.4 mg/dL (ref 8.4–10.5)
GFR: 86.61 mL/min (ref >60.00)
Glucose, Bld: 103 mg/dL — ABNORMAL HIGH (ref 70–99)
Potassium: 4.4 mEq/L (ref 3.5–5.1)
Sodium: 139 mEq/L (ref 135–145)
Total Bilirubin: 0.6 mg/dL (ref 0.2–1.2)
Total Protein: 7.6 g/dL (ref 6.0–8.3)

## 2017-03-31 LAB — CBC
HEMATOCRIT: 39.9 % (ref 36.0–46.0)
Hemoglobin: 13.8 g/dL (ref 12.0–15.0)
MCHC: 34.5 g/dL (ref 30.0–36.0)
MCV: 88.4 fl (ref 78.0–100.0)
PLATELETS: 319 10*3/uL (ref 150.0–400.0)
RBC: 4.52 Mil/uL (ref 3.87–5.11)
RDW: 13.2 % (ref 11.5–15.5)
WBC: 6.9 10*3/uL (ref 4.0–10.5)

## 2017-03-31 MED ORDER — CYCLOBENZAPRINE HCL 10 MG PO TABS: 5.0000 mg | ORAL_TABLET | Freq: Three times a day (TID) | ORAL | 0 refills | Status: DC | PRN

## 2017-03-31 MED ORDER — PREDNISONE 10 MG (21) PO TBPK: ORAL_TABLET | ORAL | 0 refills | Status: DC

## 2017-03-31 MED ORDER — IBUPROFEN-FAMOTIDINE 800-26.6 MG PO TABS: 1.0000 | ORAL_TABLET | Freq: Three times a day (TID) | ORAL | 1 refills | Status: DC | PRN

## 2017-03-31 MED FILL — CYCLOBENZAPRINE 10 MG TAB: 10 | 20 days supply | Qty: 60 | Fill #0

## 2017-03-31 MED FILL — predniSONE 10 MG (21) TBPK: 10 | 6 days supply | Qty: 21 | Fill #0

## 2017-03-31 NOTE — Assessment & Plan Note (Signed)
Continues to experience polyarthralgias of undetermined origin in the setting of positive ANA with previous blood work being negative per rheumatology. Differentials remain fibromyalgia, chronic pain syndrome, or underlying neurological condition. Refer to neurology for further assessment and treatment. Start cyclobenzaprine as needed for discomfort. Change ibuprofen to Duexis. Follow up pending neurology referral.

## 2017-03-31 NOTE — Assessment & Plan Note (Signed)
New onset neck pain consistent with cervical paraspinal muscle spasm. Obtain x-rays. Treat conservatively with ice/moist heat and home exercise therapy. Start Duexis. Start prednisone. Follow-up if symptoms worsen or do not improve for additional imaging and/or physical therapy.

## 2017-03-31 NOTE — Patient Instructions (Addendum)
Thank you for choosing [facility].  SUMMARY AND INSTRUCTIONS:  Start the Duexis up to 3 times per day.   Acetaminophen as needed in addition.  Ice/moist moist x 20 minutes every 2 hours and as needed   Stretches and exercises throughout the day.  They will call to schedule your appointment with neurology.   Start the cyclobenzaprine as needed for muscle spasms.   Medication:  Your prescription(s) have been submitted to your pharmacy or been printed and provided for you. Please take as directed and contact our office if you believe you are having problem(s) with the medication(s) or have any questions.  Labs:  Please stop by the lab on the lower level of the building for your blood work. Your results will be released to MyChart (or called to you) after review, usually within 72 hours after test completion. If any changes need to be made, you will be notified at that same time.  1.) The lab is open from 7:30am to 5:30 pm Monday-Friday 2.) No appointment is necessary 3.) Fasting (if needed) is 6-8 hours after food and drink; black coffee and water are okay   Imaging / Radiology:  Please stop by radiology on the basement level of the building for your x-rays. Your results will be released to MyChart (or called to you) after review, usually within 72 hours after test completion. If any treatments or changes are necessary, you will be notified at that same time.  Referrals:  Referrals have been made during this visit. You should expect to hear back from our schedulers in about 7-10 days in regards to establishing an appointment with the specialists we discussed.   Follow up:  If your symptoms worsen or fail to improve, please contact our office for further instruction, or in case of emergency go directly to the emergency room at the closest medical facility.    Cervical Strain and Sprain Rehab Ask your health care provider which exercises are safe for you. Do exercises  exactly as told by your health care provider and adjust them as directed. It is normal to feel mild stretching, pulling, tightness, or discomfort as you do these exercises, but you should stop right away if you feel sudden pain or your pain gets worse.Do not begin these exercises until told by your health care provider. Stretching and range of motion exercises These exercises warm up your muscles and joints and improve the movement and flexibility of your neck. These exercises also help to relieve pain, numbness, and tingling. Exercise A: Cervical side bend  1. Using good posture, sit on a stable chair or stand up. 2. Without moving your shoulders, slowly tilt your left / right ear to your shoulder until you feel a stretch in your neck muscles. You should be looking straight ahead. 3. Hold for __________ seconds. 4. Repeat with the other side of your neck. Repeat __________ times. Complete this exercise __________ times a day. Exercise B: Cervical rotation  1. Using good posture, sit on a stable chair or stand up. 2. Slowly turn your head to the side as if you are looking over your left / right shoulder. ? Keep your eyes level with the ground. ? Stop when you feel a stretch along the side and the back of your neck. 3. Hold for __________ seconds. 4. Repeat this by turning to your other side. Repeat __________ times. Complete this exercise __________ times a day. Exercise C: Thoracic extension and pectoral stretch 1. Roll a towel or  a small blanket so it is about 4 inches (10 cm) in diameter. 2. Lie down on your back on a firm surface. 3. Put the towel lengthwise, under your spine in the middle of your back. It should not be not under your shoulder blades. The towel should line up with your spine from your middle back to your lower back. 4. Put your hands behind your head and let your elbows fall out to your sides. 5. Hold for __________ seconds. Repeat __________ times. Complete this  exercise __________ times a day. Strengthening exercises These exercises build strength and endurance in your neck. Endurance is the ability to use your muscles for a long time, even after your muscles get tired. Exercise D: Upper cervical flexion, isometric 1. Lie on your back with a thin pillow behind your head and a small rolled-up towel under your neck. 2. Gently tuck your chin toward your chest and nod your head down to look toward your feet. Do not lift your head off the pillow. 3. Hold for __________ seconds. 4. Release the tension slowly. Relax your neck muscles completely before you repeat this exercise. Repeat __________ times. Complete this exercise __________ times a day. Exercise E: Cervical extension, isometric  1. Stand about 6 inches (15 cm) away from a wall, with your back facing the wall. 2. Place a soft object, about 6-8 inches (15-20 cm) in diameter, between the back of your head and the wall. A soft object could be a small pillow, a ball, or a folded towel. 3. Gently tilt your head back and press into the soft object. Keep your jaw and forehead relaxed. 4. Hold for __________ seconds. 5. Release the tension slowly. Relax your neck muscles completely before you repeat this exercise. Repeat __________ times. Complete this exercise __________ times a day. Posture and body mechanics  Body mechanics refers to the movements and positions of your body while you do your daily activities. Posture is part of body mechanics. Good posture and healthy body mechanics can help to relieve stress in your body's tissues and joints. Good posture means that your spine is in its natural S-curve position (your spine is neutral), your shoulders are pulled back slightly, and your head is not tipped forward. The following are general guidelines for applying improved posture and body mechanics to your everyday activities. Standing  When standing, keep your spine neutral and keep your feet about  hip-width apart. Keep a slight bend in your knees. Your ears, shoulders, and hips should line up.  When you do a task in which you stand in one place for a long time, place one foot up on a stable object that is 2-4 inches (5-10 cm) high, such as a footstool. This helps keep your spine neutral. Sitting   When sitting, keep your spine neutral and your keep feet flat on the floor. Use a footrest, if necessary, and keep your thighs parallel to the floor. Avoid rounding your shoulders, and avoid tilting your head forward.  When working at a desk or a computer, keep your desk at a height where your hands are slightly lower than your elbows. Slide your chair under your desk so you are close enough to maintain good posture.  When working at a computer, place your monitor at a height where you are looking straight ahead and you do not have to tilt your head forward or downward to look at the screen. Resting When lying down and resting, avoid positions that are most painful  for you. Try to support your neck in a neutral position. You can use a contour pillow or a small rolled-up towel. Your pillow should support your neck but not push on it. This information is not intended to replace advice given to you by your health care provider. Make sure you discuss any questions you have with your health care provider. Document Released: 10/18/2005 Document Revised: 06/24/2016 Document Reviewed: 09/24/2015 Elsevier Interactive Patient Education  [employer].

## 2017-03-31 NOTE — Progress Notes (Signed)
Subjective:    Patient ID: Angela [name], female    DOB: October 20, 1981, 36 y.o.   MRN: [id number]  Chief Complaint  Patient presents with  . Follow-up    starting to have numbness with joint pain, very fatigue     HPI:  Angela DillonHeather L Graddy is a 36 y.o. female who  has a past medical history of Abnormal Pap smear; Herpes zoster; Panic attacks; PCOS (polycystic ovarian syndrome); and PP SVD 12/02/11 M (12/03/2011). and presents today for a follow up office visit.    Arthralgia - Recently evaluated by Rheumatology for continued polyarthralgia and fatigue with a positive ANA. Additional blood work was completed at the time with the recommendation for continued NSAIDs and follow up in 3 weeks. Other previous blood work to this point has been negative. Continues to experience polyarthralgias and fatigue and now having numbness located in her right upper extremity starting in her neck. No trauma or injury. Severity is enough to effect her activities of daily living secondary to decreased motion.   No Known Allergies    Outpatient Medications Prior to Visit  Medication Sig Dispense Refill  . ALPRAZolam (XANAX) 0.5 MG tablet Take 1 tablet (0.5 mg total) by mouth 2 (two) times daily as needed for anxiety or sleep. 30 tablet 1  . sertraline (ZOLOFT) 50 MG tablet Take 1 tablet (50 mg total) by mouth daily. 90 tablet 1  . valACYclovir (VALTREX) 500 MG tablet Take 500 mg by mouth 2 (two) times daily.     No facility-administered medications prior to visit.       Past Surgical History:  Procedure Laterality Date  . BREAST BIOPSY    . WISDOM TOOTH EXTRACTION        Past Medical History:  Diagnosis Date  . Abnormal Pap smear    repeat ok  . Herpes zoster   . Panic attacks   . PCOS (polycystic ovarian syndrome)    conceived on metformin and clomid  . PP SVD 12/02/11 M 12/03/2011      Review of Systems  Constitutional: Negative for chills and fever.  Respiratory: Negative for chest  tightness, shortness of breath and wheezing.   Musculoskeletal: Positive for arthralgias and neck pain.  Neurological: Positive for numbness. Negative for weakness.      Objective:    BP 116/78 (BP Location: Left Arm, Patient Position: Sitting, Cuff Size: Normal)   Pulse 81   Temp 98.9 F (37.2 C) (Oral)   Resp 16   Ht 5\' 7"  (1.702 m)   Wt 147 lb 12.8 oz (67 kg)   SpO2 99%   BMI 23.15 kg/m  Nursing note and vital signs reviewed.  Physical Exam  Constitutional: She is oriented to person, place, and time. She appears well-developed and well-nourished. No distress.  Neck:  No obvious deformity, discoloration, or edema. Palpable tenderness of left upper trapezius and lower cervical transverse processes. Range of motion is limited in lateral bending and rotation to the right. Negative cervical compression test. Distal pulses and sensation are intact and appropriate.  Cardiovascular: Normal rate, regular rhythm, normal heart sounds and intact distal pulses.   Pulmonary/Chest: Effort normal and breath sounds normal.  Neurological: She is alert and oriented to person, place, and time.  Skin: Skin is warm and dry.  Psychiatric: She has a normal mood and affect. Her behavior is normal. Judgment and thought content normal.       Assessment & Plan:   Problem List Items Addressed This  Visit      Other   Polyarthralgia    Continues to experience polyarthralgias of undetermined origin in the setting of positive ANA with previous blood work being negative per rheumatology. Differentials remain fibromyalgia, chronic pain syndrome, or underlying neurological condition. Refer to neurology for further assessment and treatment. Start cyclobenzaprine as needed for discomfort. Change ibuprofen to Duexis. Follow up pending neurology referral.       Relevant Orders   Ambulatory referral to Neurology   CBC (Completed)   Comprehensive metabolic panel (Completed)   Cervical paraspinal muscle spasm -  Primary    New onset neck pain consistent with cervical paraspinal muscle spasm. Obtain x-rays. Treat conservatively with ice/moist heat and home exercise therapy. Start Duexis. Start prednisone. Follow-up if symptoms worsen or do not improve for additional imaging and/or physical therapy.      Relevant Orders   DG Cervical Spine Complete       I am having Ms. Schoenfeldt start on cyclobenzaprine, Ibuprofen-Famotidine, and predniSONE. I am also having her maintain her valACYclovir, sertraline, ALPRAZolam, and Turmeric.   Meds ordered this encounter  Medications  . Turmeric 500 MG TABS    Sig: Take 500 mg by mouth.  . cyclobenzaprine (FLEXERIL) 10 MG tablet    Sig: Take 0.5-1 tablets (5-10 mg total) by mouth 3 (three) times daily as needed for muscle spasms.    Dispense:  60 tablet    Refill:  0    Order Specific Question:   Supervising Provider    Answer:   Hillard Danker A [[id number]]  . Ibuprofen-Famotidine 800-26.6 MG TABS    Sig: Take 1 tablet by mouth 3 (three) times daily as needed.    Dispense:  90 tablet    Refill:  1    Order Specific Question:   Supervising Provider    Answer:   Hillard Danker A [[id number]]  . predniSONE (STERAPRED UNI-PAK 21 TAB) 10 MG (21) TBPK tablet    Sig: Take 6 tablets x 1 day, 5 tablets x 1 day, 4 tablets x 1 day, 3 tablets x 1 day, 2 tablets x 1 day, 1 tablet x 1 day    Dispense:  21 tablet    Refill:  0    Order Specific Question:   Supervising Provider    Answer:   Hillard Danker A [[id number]]     Follow-up: Return in about 1 month (around 04/30/2017), or if symptoms worsen or fail to improve.  Jeanine Luz, FNP \

## 2017-05-09 MED FILL — SERTRALINE HCL 50 MG TABLET: 50 | 90 days supply | Qty: 90 | Fill #1

## 2017-06-29 ENCOUNTER — Ambulatory Visit (INDEPENDENT_AMBULATORY_CARE_PROVIDER_SITE_OTHER): Payer: 59 | Admitting: Neurology

## 2017-06-29 ENCOUNTER — Other Ambulatory Visit (INDEPENDENT_AMBULATORY_CARE_PROVIDER_SITE_OTHER): Payer: 59

## 2017-06-29 ENCOUNTER — Encounter: Payer: Self-pay | Admitting: Family

## 2017-06-29 ENCOUNTER — Encounter: Payer: Self-pay | Admitting: Neurology

## 2017-06-29 VITALS — BP 110/70 | HR 51 | Ht 67.0 in | Wt 147.6 lb

## 2017-06-29 DIAGNOSIS — M2550 Pain in unspecified joint: Secondary | ICD-10-CM

## 2017-06-29 DIAGNOSIS — R202 Paresthesia of skin: Secondary | ICD-10-CM | POA: Diagnosis not present

## 2017-06-29 DIAGNOSIS — R200 Anesthesia of skin: Secondary | ICD-10-CM

## 2017-06-29 LAB — VITAMIN B12: Vitamin B-12: 977 pg/mL — ABNORMAL HIGH (ref 211–911)

## 2017-06-29 NOTE — Patient Instructions (Signed)
NCS/EMG of right arm and leg Check vitamin B12 and B1 We will post your results to MyChart.  If you have any questions or would like to proceed with MRI brain, please call my office

## 2017-06-29 NOTE — Progress Notes (Signed)
[facility] Neu[facility] Clinic Note - Initial Visit   Date: 06/29/17  Angela [name] MRN: [id number] DOB: 09-Sep-1981   Dear Jeanine Luz, FNP:  Thank you for your kind referral of Angela [name] for consultation of paresthesias. Although her history is well known to you, please allow [place] to reiterate it for the purpose of our medical record. The patient was accompanied to the clinic by husband who also provides collateral information.     History of Present Illness: Angela [name] is a 36 y.o. right-handed Caucasian female with panic attacks, depression, and PCOS presenting for evaluation of paresthesias.    Starting around March 2018, she began having joint pain involving the upper and lower legs.  She has achy pain throughout her body (hands, fingers, shoulders, shoulder blades, SI joint, knees, and ankles).  It is worse after activity and she endorses morning stiffness. She takes ibuprofen 800mg  as needed which provides some relief.  She was seen by rheumatology with negative work-up.    Around the same time, she started having tingling of the fingers and toes.  Symptoms are intermittent and worse with activity.  She has not identified any alleviating factors.  She feels clumsy at times, but has not suffered any falls.  She also has right sided neck pain which helped with topical ointments.  She denies any cramps.  She has occasional left sided headaches and saw her eye doctor whose evaluation was normal.  She denies any vision loss.  Out-side paper records, electronic medical record, and images have been reviewed where available and summarized as:  Labs 02/07/2017:  TSH 3.54, RF neg, ANA 1:160   Past Medical History:  Diagnosis Date  . Abnormal Pap smear    repeat ok  . Herpes zoster   . Panic attacks   . PCOS (polycystic ovarian syndrome)    conceived on metformin and clomid  . PP SVD 12/02/11 M 12/03/2011    Past Surgical History:  Procedure Laterality  Date  . BREAST BIOPSY    . WISDOM TOOTH EXTRACTION       Medications:  Outpatient Encounter Prescriptions as of 06/29/2017  Medication Sig  . ALPRAZolam (XANAX) 0.5 MG tablet Take 1 tablet (0.5 mg total) by mouth 2 (two) times daily as needed for anxiety or sleep.  . cyclobenzaprine (FLEXERIL) 10 MG tablet Take 0.5-1 tablets (5-10 mg total) by mouth 3 (three) times daily as needed for muscle spasms.  . Ibuprofen-Famotidine 800-26.6 MG TABS Take 1 tablet by mouth 3 (three) times daily as needed.  . sertraline (ZOLOFT) 50 MG tablet Take 1 tablet (50 mg total) by mouth daily.  . Turmeric 500 MG TABS Take 500 mg by mouth.  . valACYclovir (VALTREX) 500 MG tablet Take 500 mg by mouth 2 (two) times daily.  . [DISCONTINUED] predniSONE (STERAPRED UNI-PAK 21 TAB) 10 MG (21) TBPK tablet Take 6 tablets x 1 day, 5 tablets x 1 day, 4 tablets x 1 day, 3 tablets x 1 day, 2 tablets x 1 day, 1 tablet x 1 day   No facility-administered encounter medications on file as of 06/29/2017.      Allergies: No Known Allergies  Family History: Family History  Problem Relation Age of Onset  . Diabetes Father        uncontrolled  . Hypertension Father   . Hyperlipidemia Father   . Alcohol abuse Other   . Arthritis Other   . Ovarian cancer Paternal Grandmother   . Anesthesia problems Neg  Hx     Social History: Social History  Substance Use Topics  . Smoking status: Former Smoker    Quit date: 11/01/2009  . Smokeless tobacco: Never Used  . Alcohol use 1.2 oz/week    2 Cans of beer per week   Social History   Social History Narrative   Works as a Systems developer; Fun: [name] out with family.    Denies any religious beliefs effecting health care.    Lives with husband and son in a 2 story home.     Education: college.     Review of Systems:  CONSTITUTIONAL: No fevers, chills, night sweats, or weight loss.   EYES: No visual changes or eye pain ENT: No hearing changes.  No history of nose bleeds.     RESPIRATORY: No cough, wheezing and shortness of breath.   CARDIOVASCULAR: Negative for chest pain, and palpitations.   GI: Negative for abdominal discomfort, blood in stools or black stools.  No recent change in bowel habits.   GU:  No history of incontinence.   MUSCLOSKELETAL: No history of joint pain or swelling.  No myalgias.   SKIN: Negative for lesions, rash, and itching.   HEMATOLOGY/ONCOLOGY: Negative for prolonged bleeding, bruising easily, and swollen nodes.  No history of cancer.   ENDOCRINE: Negative for cold or heat intolerance, polydipsia or goiter.   PSYCH:  No depression +anxiety symptoms.   NEURO: As Above.   Vital Signs:  BP 110/70   Pulse (!) 51   Ht 5\' 7"  (1.702 m)   Wt 147 lb 9 oz (66.9 kg)   SpO2 93%   BMI 23.11 kg/m    General Medical Exam:   General:  Well appearing, comfortable.   Eyes/ENT: see cranial nerve examination.   Neck: No masses appreciated.  Full range of motion without tenderness.  No carotid bruits. Respiratory:  Clear to auscultation, good air entry bilaterally.   Cardiac:  Regular rate and rhythm, no murmur.   Extremities:  No deformities, edema, or skin discoloration.  Skin:  No rashes or lesions.  Neurological Exam: MENTAL STATUS including orientation to time, place, person, recent and remote memory, attention span and concentration, language, and fund of knowledge is normal.  Speech is not dysarthric.  CRANIAL NERVES: II:  No visual field defects.  Unremarkable fundi.   III-IV-VI: Pupils equal round and reactive to light.  Normal conjugate, extra-ocular eye movements in all directions of gaze.  No nystagmus.  No ptosis.   V:  Normal facial sensation.    VII:  Normal facial symmetry and movements.   VIII:  Normal hearing and vestibular function.   IX-X:  Normal palatal movement.   XI:  Normal shoulder shrug and head rotation.   XII:  Normal tongue strength and range of motion, no deviation or fasciculation.  MOTOR:  No atrophy,  fasciculations or abnormal movements.  No pronator drift.  Tone is normal.   She had tenderness to palpation over several fibromyalgia tenderpoints.   Right Upper Extremity:    Left Upper Extremity:    Deltoid  5/5   Deltoid  5/5   Biceps  5/5   Biceps  5/5   Triceps  5/5   Triceps  5/5   Wrist extensors  5/5   Wrist extensors  5/5   Wrist flexors  5/5   Wrist flexors  5/5   Finger extensors  5/5   Finger extensors  5/5   Finger flexors  5/5   Finger flexors  5/5   Dorsal interossei  5/5   Dorsal interossei  5/5   Abductor pollicis  5/5   Abductor pollicis  5/5   Tone (Ashworth scale)  0  Tone (Ashworth scale)  0   Right Lower Extremity:    Left Lower Extremity:    Hip flexors  5/5   Hip flexors  5/5   Hip extensors  5/5   Hip extensors  5/5   Knee flexors  5/5   Knee flexors  5/5   Knee extensors  5/5   Knee extensors  5/5   Dorsiflexors  5/5   Dorsiflexors  5/5   Plantarflexors  5/5   Plantarflexors  5/5   Toe extensors  5/5   Toe extensors  5/5   Toe flexors  5/5   Toe flexors  5/5   Tone (Ashworth scale)  0  Tone (Ashworth scale)  0   MSRs:  Right                                                                 Left brachioradialis 2+  brachioradialis 2+  biceps 2+  biceps 2+  triceps 2+  triceps 2+  patellar 2+  patellar 2+  ankle jerk 2+  ankle jerk 2+  Hoffman no  Hoffman no  plantar response down  plantar response down   SENSORY:  Normal and symmetric perception of light touch, pinprick, vibration, and proprioception.  Romberg's sign absent.   COORDINATION/GAIT: Normal finger-to- nose-finger and heel-to-shin.  Intact rapid alternating movements bilaterally.  Able to rise from a chair without using arms.  Gait narrow based and stable. Tandem and stressed gait intact.    IMPRESSION: Mrs. Angela [name] is a 36 year-old female with polyarthralgias referred for evaluation of parethesias of the hands and feet.  Her neurological exam is entirely normal and non-focal.  I reassured  patient that with a normal neurological exam, the likelihood of anything worrisome is very low.  Her paresthesias do not confirm to any dermatomal or cutaneous nerve distribution.  I also explained that demyelinating disease, such as multiple sclerosis, does not manifesting with muscle/joint pain, therefore I do not see that MRI brain is warranted at this time.  Further, she has several fibromyalgia tenderpoints which raises the possibility of a pain syndrome.    PLAN/RECOMMENDATIONS:  1.  NCS/EMG of the right arm and leg 2.  Check vitamin B12 and vitamin B1 3.  Consider MRI brain if symptoms progress  The duration of this appointment visit was 35 minutes of face-to-face time with the patient.  Greater than 50% of this time was spent in counseling, explanation of diagnosis, planning of further management, and coordination of care.   Thank you for allowing me to participate in patient's care.  If I can answer any additional questions, I would be pleased to do so.    Sincerely,    Donika K. Allena KatzPatel, DO

## 2017-07-04 LAB — VITAMIN B1: VITAMIN B1 (THIAMINE): 11 nmol/L (ref 8–30)

## 2017-07-06 ENCOUNTER — Encounter: Payer: Self-pay | Admitting: Family

## 2017-07-06 ENCOUNTER — Ambulatory Visit (INDEPENDENT_AMBULATORY_CARE_PROVIDER_SITE_OTHER): Payer: 59 | Admitting: Family

## 2017-07-06 VITALS — BP 124/74 | HR 91 | Temp 98.4°F | Resp 16 | Ht 67.0 in | Wt 148.8 lb

## 2017-07-06 DIAGNOSIS — G894 Chronic pain syndrome: Secondary | ICD-10-CM | POA: Diagnosis not present

## 2017-07-06 MED ORDER — IBUPROFEN-FAMOTIDINE 800-26.6 MG PO TABS: 1.0000 | ORAL_TABLET | Freq: Three times a day (TID) | ORAL | 5 refills | Status: DC | PRN

## 2017-07-06 MED ORDER — AMITRIPTYLINE HCL 10 MG PO TABS: 10.0000 mg | ORAL_TABLET | Freq: Every day | ORAL | 0 refills | Status: DC

## 2017-07-06 MED FILL — AMITRIPTYLINE HCL 10 MG TAB: 10 | 30 days supply | Qty: 30 | Fill #0

## 2017-07-06 NOTE — Progress Notes (Signed)
Subjective:    Patient ID: Angela [name], female    DOB: 1981/09/02, 36 y.o.   MRN: [id number]  Chief Complaint  Patient presents with  . Follow-up    Follow up from neurology     HPI:  Angela DillonHeather L Klingerman is a 36 y.o. female who  has a past medical history of Abnormal Pap smear; Herpes zoster; Panic attacks; PCOS (polycystic ovarian syndrome); and PP SVD 12/02/11 M (12/03/2011). and presents today for a follow up office visit.   Continues to experience the associated symptoms of myalgias and arthralgias located in multiple joints that is improved with the modifying factors of flexeril and Duexis. She does continue to have achiness in her hands that is refractory to the current medication. Was seen by neurology with no significant neurological findings that would be concerning for a neurological origin. Per neurology note this is likely a pain syndrome or fibromyalgia.   No Known Allergies    Outpatient Medications Prior to Visit  Medication Sig Dispense Refill  . ALPRAZolam (XANAX) 0.5 MG tablet Take 1 tablet (0.5 mg total) by mouth 2 (two) times daily as needed for anxiety or sleep. 30 tablet 1  . cyclobenzaprine (FLEXERIL) 10 MG tablet Take 0.5-1 tablets (5-10 mg total) by mouth 3 (three) times daily as needed for muscle spasms. 60 tablet 0  . sertraline (ZOLOFT) 50 MG tablet Take 1 tablet (50 mg total) by mouth daily. 90 tablet 1  . Ibuprofen-Famotidine 800-26.6 MG TABS Take 1 tablet by mouth 3 (three) times daily as needed. 90 tablet 1  . Turmeric 500 MG TABS Take 500 mg by mouth.    . valACYclovir (VALTREX) 500 MG tablet Take 500 mg by mouth 2 (two) times daily.     No facility-administered medications prior to visit.     Past Medical History:  Diagnosis Date  . Abnormal Pap smear    repeat ok  . Herpes zoster   . Panic attacks   . PCOS (polycystic ovarian syndrome)    conceived on metformin and clomid  . PP SVD 12/02/11 M 12/03/2011    Review of Systems    Constitutional: Negative for chills and fever.  Respiratory: Negative for chest tightness and shortness of breath.   Cardiovascular: Negative for chest pain, palpitations and leg swelling.  Musculoskeletal: Positive for arthralgias and myalgias.      Objective:    BP 124/74 (BP Location: Left Arm, Patient Position: Sitting, Cuff Size: Normal)   Pulse 91   Temp 98.4 F (36.9 C) (Oral)   Resp 16   Ht 5\' 7"  (1.702 m)   Wt 148 lb 12.8 oz (67.5 kg)   SpO2 95%   BMI 23.31 kg/m  Nursing note and vital signs reviewed.  Physical Exam  Constitutional: She is oriented to person, place, and time. She appears well-developed and well-nourished. No distress.  Cardiovascular: Normal rate, regular rhythm, normal heart sounds and intact distal pulses.   Pulmonary/Chest: Effort normal and breath sounds normal.  Neurological: She is alert and oriented to person, place, and time.  Skin: Skin is warm and dry.  Psychiatric: She has a normal mood and affect. Her behavior is normal. Judgment and thought content normal.       Assessment & Plan:   Problem List Items Addressed This Visit      Other   Chronic pain syndrome - Primary    Symptoms and exam are consistent with chronic pain syndrome and possible fibromyalgia. Hand pain with concern  for Raynaud's. Continue current dosage of Flexeril and Duexis. Start amitriptyline. May need to discontinue sertraline pending amitriptyline dosage. Start Pennsaid trial for hand pain. Encouraged to continue nonpharmacological treatments including stretches and exercises daily. Continue to monitor.          I have discontinued Ms. Emmick's valACYclovir and Turmeric. I am also having her start on amitriptyline. Additionally, I am having her maintain her sertraline, ALPRAZolam, cyclobenzaprine, and Ibuprofen-Famotidine.   Meds ordered this encounter  Medications  . amitriptyline (ELAVIL) 10 MG tablet    Sig: Take 1 tablet (10 mg total) by mouth at bedtime.     Dispense:  30 tablet    Refill:  0    Order Specific Question:   Supervising Provider    Answer:   Hillard Danker A [[id number]]  . Ibuprofen-Famotidine 800-26.6 MG TABS    Sig: Take 1 tablet by mouth 3 (three) times daily as needed.    Dispense:  90 tablet    Refill:  5    Order Specific Question:   Supervising Provider    Answer:   Hillard Danker A [[id number]]     Follow-up: Return in about 2 months (around 09/05/2017), or if symptoms worsen or fail to improve.  Jeanine Luz, FNP

## 2017-07-06 NOTE — Assessment & Plan Note (Signed)
Symptoms and exam are consistent with chronic pain syndrome and possible fibromyalgia. Hand pain with concern for Raynaud's. Continue current dosage of Flexeril and Duexis. Start amitriptyline. May need to discontinue sertraline pending amitriptyline dosage. Start Pennsaid trial for hand pain. Encouraged to continue nonpharmacological treatments including stretches and exercises daily. Continue to monitor.

## 2017-07-06 NOTE — Patient Instructions (Signed)
Thank you for choosing [facility].  SUMMARY AND INSTRUCTIONS:  Start taking the amitriptyline at night.   We may need to decrease or discontinue Zoloft at some point.  Continue with the Flexeril and Duexis.   Try Pennsaid on the hands.    Medication:  Your prescription(s) have been submitted to your pharmacy or been printed and provided for you. Please take as directed and contact our office if you believe you are having problem(s) with the medication(s) or have any questions.  Follow up:  If your symptoms worsen or fail to improve, please contact our office for further instruction, or in case of emergency go directly to the emergency room at the closest medical facility.

## 2017-07-16 ENCOUNTER — Encounter: Payer: Self-pay | Admitting: Family

## 2017-07-29 ENCOUNTER — Encounter: Payer: Self-pay | Admitting: Family

## 2017-07-29 MED ORDER — PREGABALIN 25 MG PO CAPS: 25.0000 mg | ORAL_CAPSULE | Freq: Two times a day (BID) | ORAL | 0 refills | Status: DC

## 2017-07-29 MED ORDER — AMITRIPTYLINE HCL 25 MG PO TABS: 25.0000 mg | ORAL_TABLET | Freq: Every day | ORAL | 0 refills | Status: DC

## 2017-07-29 MED ORDER — CYCLOBENZAPRINE HCL 10 MG PO TABS: 5.0000 mg | ORAL_TABLET | Freq: Three times a day (TID) | ORAL | 0 refills | Status: DC | PRN

## 2017-08-01 MED FILL — AMITRIPTYLINE HCL 25 MG TAB: 25 | 30 days supply | Qty: 30 | Fill #0

## 2017-08-01 MED FILL — CYCLOBENZAPRINE 10 MG TABLE: 10 | 20 days supply | Qty: 60 | Fill #0

## 2017-08-03 ENCOUNTER — Other Ambulatory Visit: Payer: Self-pay

## 2017-08-03 MED ORDER — PREGABALIN 25 MG PO CAPS: 25.0000 mg | ORAL_CAPSULE | Freq: Two times a day (BID) | ORAL | 0 refills | Status: DC

## 2017-08-03 MED FILL — LYRICA 25 MG CAPSULE: 25 | 30 days supply | Qty: 60 | Fill #0

## 2017-08-05 MED FILL — ALPRAZolam 0.5 MG TABS: 0.5 | 15 days supply | Qty: 30 | Fill #1

## 2017-08-10 ENCOUNTER — Other Ambulatory Visit: Payer: Self-pay | Admitting: Family

## 2017-08-10 DIAGNOSIS — F329 Major depressive disorder, single episode, unspecified: Secondary | ICD-10-CM

## 2017-08-10 DIAGNOSIS — F419 Anxiety disorder, unspecified: Principal | ICD-10-CM

## 2017-08-10 DIAGNOSIS — F32A Depression, unspecified: Secondary | ICD-10-CM

## 2017-08-10 MED FILL — SERTRALINE HCL 50 MG TABLET: 50 | 90 days supply | Qty: 90 | Fill #0

## 2017-09-02 ENCOUNTER — Ambulatory Visit (INDEPENDENT_AMBULATORY_CARE_PROVIDER_SITE_OTHER): Payer: 59 | Admitting: Internal Medicine

## 2017-09-02 ENCOUNTER — Encounter: Payer: Self-pay | Admitting: Internal Medicine

## 2017-09-02 VITALS — BP 126/78 | HR 138 | Temp 98.4°F | Ht 67.0 in | Wt 148.0 lb

## 2017-09-02 DIAGNOSIS — Z0000 Encounter for general adult medical examination without abnormal findings: Secondary | ICD-10-CM

## 2017-09-02 DIAGNOSIS — F329 Major depressive disorder, single episode, unspecified: Secondary | ICD-10-CM

## 2017-09-02 DIAGNOSIS — M797 Fibromyalgia: Secondary | ICD-10-CM | POA: Diagnosis not present

## 2017-09-02 DIAGNOSIS — F419 Anxiety disorder, unspecified: Secondary | ICD-10-CM

## 2017-09-02 DIAGNOSIS — G894 Chronic pain syndrome: Secondary | ICD-10-CM

## 2017-09-02 HISTORY — DX: Fibromyalgia: M79.7

## 2017-09-02 MED ORDER — SERTRALINE HCL 100 MG PO TABS: 100.0000 mg | ORAL_TABLET | Freq: Every day | ORAL | 3 refills | Status: DC

## 2017-09-02 MED ORDER — PREGABALIN 50 MG PO CAPS: 50.0000 mg | ORAL_CAPSULE | Freq: Two times a day (BID) | ORAL | 1 refills | Status: DC

## 2017-09-02 MED FILL — LYRICA 50 MG CAPSULE: 50 | 90 days supply | Qty: 180 | Fill #0

## 2017-09-02 MED FILL — SERTRALINE HCL 100 MG TAB: 100 | 90 days supply | Qty: 90 | Fill #0

## 2017-09-02 NOTE — Patient Instructions (Signed)
Ok to increase the zoloft to 100 mg per day  OK to increase the lyrica to 50 mg twice per day  Please continue all other medications as before, and refills have been done if requested.  Please have the pharmacy call with any other refills you may need.  Please continue your efforts at being more active, low cholesterol diet, and weight control.  Please keep your appointments with your specialists as you may have planned  Please return in 1 year for your yearly visit, or sooner if needed, with Lab testing done 3-5 days before

## 2017-09-02 NOTE — Progress Notes (Signed)
Subjective:    Patient ID: Angela DillonHeather L [name], female    DOB: 1981/03/15, 36 y.o.   MRN: [id number]  HPI  Here to f/u in transfer to new PCP, Pt denies chest pain, increased sob or doe, wheezing, orthopnea, PND, increased LE swelling, palpitations, dizziness or syncope.   Pt denies polydipsia, polyuria,   Pt states overall good compliance with meds.   Pt denies fever, wt loss, night sweats, loss of appetite, or other constitutional symptoms  Denies worsening reflux, abd pain, dysphagia, n/v, bowel change or blood.  Wt Readings from Last 3 Encounters:  09/02/17 148 lb (67.1 kg)  07/06/17 148 lb 12.8 oz (67.5 kg)  06/29/17 147 lb 9 oz (66.9 kg)  Did see Rheumatology - no RA or Lupus, also seen per neurology with neg exam per pt, now trying the lyrica low dose for probable FMS.    Pt reports anxiety has been some uncontrolled, has multiple stressors. Denies worsening depressive symptoms, suicidal ideation, or panic Past Medical History:  Diagnosis Date  . Abnormal Pap smear    repeat ok  . Fibromyalgia 09/02/2017  . Herpes zoster   . Panic attacks   . PCOS (polycystic ovarian syndrome)    conceived on metformin and clomid  . PP SVD 12/02/11 M 12/03/2011   Past Surgical History:  Procedure Laterality Date  . BREAST BIOPSY    . WISDOM TOOTH EXTRACTION      reports that she quit smoking about 7 years ago. she has never used smokeless tobacco. She reports that she drinks about 1.2 oz of alcohol per week. She reports that she does not use drugs. family history includes Alcohol abuse in her other; Arthritis in her other; Diabetes in her father; Hyperlipidemia in her father; Hypertension in her father; Ovarian cancer in her paternal grandmother. No Known Allergies Current Outpatient Medications on File Prior to Visit  Medication Sig Dispense Refill  . ALPRAZolam (XANAX) 0.5 MG tablet Take 1 tablet (0.5 mg total) by mouth 2 (two) times daily as needed for anxiety or sleep. 30 tablet 1  .  cyclobenzaprine (FLEXERIL) 10 MG tablet Take 0.5-1 tablets (5-10 mg total) by mouth 3 (three) times daily as needed for muscle spasms. 60 tablet 0  . Ibuprofen-Famotidine 800-26.6 MG TABS Take 1 tablet by mouth 3 (three) times daily as needed. 90 tablet 5   No current facility-administered medications on file prior to visit.    Review of Systems  Constitutional: Negative for other unusual diaphoresis or sweats HENT: Negative for ear discharge or swelling Eyes: Negative for other worsening visual disturbances Respiratory: Negative for stridor or other swelling  Gastrointestinal: Negative for worsening distension or other blood Genitourinary: Negative for retention or other urinary change Musculoskeletal: Negative for other MSK pain or swelling Skin: Negative for color change or other new lesions Neurological: Negative for worsening tremors and other numbness  Psychiatric/Behavioral: Negative for worsening agitation or other fatigue All other system neg per pt    Objective:   Physical Exam BP 126/78   Pulse (!) 138   Temp 98.4 F (36.9 C) (Oral)   Ht 5\' 7"  (1.702 m)   Wt 148 lb (67.1 kg)   SpO2 98%   BMI 23.18 kg/m  VS noted,  Constitutional: Pt appears in NAD HENT: Head: NCAT.  Right Ear: External ear normal.  Left Ear: External ear normal.  Eyes: . Pupils are equal, round, and reactive to light. Conjunctivae and EOM are normal Nose: without d/c or deformity Neck:  Neck supple. Gross normal ROM Cardiovascular: Normal rate and regular rhythm.   Pulmonary/Chest: Effort normal and breath sounds without rales or wheezing.  Neurological: Pt is alert. At baseline orientation, motor grossly intact Skin: Skin is warm. No rashes, other new lesions, no LE edema Psychiatric: Pt behavior is normal without agitation but 1-2+ nervous, not depressed affect No other exam findings Lab Results  Component Value Date   WBC 6.9 03/31/2017   HGB 13.8 03/31/2017   HCT 39.9 03/31/2017   PLT  319.0 03/31/2017   GLUCOSE 103 (H) 03/31/2017   CHOL 217 (H) 08/18/2016   TRIG 91.0 08/18/2016   HDL 63.30 08/18/2016   LDLDIRECT 108.1 01/11/2011   LDLCALC 135 (H) 08/18/2016   ALT 21 03/31/2017   AST 19 03/31/2017   NA 139 03/31/2017   K 4.4 03/31/2017   CL 106 03/31/2017   CREATININE 0.80 03/31/2017   BUN 13 03/31/2017   CO2 27 03/31/2017   TSH 3.54 02/07/2017      Assessment & Plan:

## 2017-09-04 NOTE — Assessment & Plan Note (Signed)
Ok for increased zoloft 100 qd, declines counseling or psychiatry referral for now

## 2017-09-04 NOTE — Assessment & Plan Note (Signed)
D/w pt, ok for advil prn,  to f/u any worsening symptoms or concerns

## 2017-09-04 NOTE — Assessment & Plan Note (Signed)
Mild symptomatic persists, ok for increased lyrica 50 bid,  to f/u any worsening symptoms or concerns

## 2017-10-05 ENCOUNTER — Encounter: Payer: Self-pay | Admitting: Internal Medicine

## 2017-10-12 ENCOUNTER — Telehealth: Payer: Self-pay | Admitting: Internal Medicine

## 2017-10-12 NOTE — Telephone Encounter (Signed)
Ok with me 

## 2017-10-12 NOTE — Telephone Encounter (Signed)
[facility] She has saw Dr.John once. He likes him but really wants to see a woman.   Dr.John is this ok with you?  Apolonio Schneidersshleigh is this ok with you?

## 2017-10-12 NOTE — Telephone Encounter (Signed)
I am okay with this

## 2017-10-13 NOTE — Telephone Encounter (Signed)
LVM to inform patient. After speaking with her yesterday I did go ahead and make her a appointment for 12/20/17 @ 10am. Informed her if this does not work to please call back and reschedule.

## 2017-10-30 ENCOUNTER — Encounter: Payer: Self-pay | Admitting: Emergency Medicine

## 2017-10-30 ENCOUNTER — Other Ambulatory Visit: Payer: Self-pay

## 2017-10-30 ENCOUNTER — Encounter (HOSPITAL_COMMUNITY): Payer: Self-pay | Admitting: Emergency Medicine

## 2017-10-30 ENCOUNTER — Emergency Department (HOSPITAL_COMMUNITY): Payer: 59

## 2017-10-30 ENCOUNTER — Emergency Department (INDEPENDENT_AMBULATORY_CARE_PROVIDER_SITE_OTHER)
Admission: EM | Admit: 2017-10-30 | Discharge: 2017-10-30 | Disposition: A | Discharge status: Home / Self Care | Payer: 59 | Source: Home / Self Care | Attending: Family Medicine | Admitting: Family Medicine

## 2017-10-30 ENCOUNTER — Emergency Department (HOSPITAL_COMMUNITY)
Admission: EM | Admit: 2017-10-30 | Discharge: 2017-10-30 | Disposition: A | Discharge status: Home / Self Care | Payer: 59 | Attending: Emergency Medicine | Admitting: Emergency Medicine

## 2017-10-30 DIAGNOSIS — R51 Headache: Secondary | ICD-10-CM

## 2017-10-30 DIAGNOSIS — H5712 Ocular pain, left eye: Secondary | ICD-10-CM | POA: Insufficient documentation

## 2017-10-30 DIAGNOSIS — Z87891 Personal history of nicotine dependence: Secondary | ICD-10-CM | POA: Insufficient documentation

## 2017-10-30 DIAGNOSIS — R519 Headache, unspecified: Secondary | ICD-10-CM

## 2017-10-30 MED ORDER — METOCLOPRAMIDE HCL 5 MG/ML IJ SOLN
5.0000 mg | Freq: Once | INTRAMUSCULAR | Status: AC
  Administered 2017-10-30: 5 mg via INTRAMUSCULAR

## 2017-10-30 MED ORDER — TETRACAINE HCL 0.5 % OP SOLN
2.0000 [drp] | Freq: Once | OPHTHALMIC | Status: AC
  Administered 2017-10-30: 2 [drp] via OPHTHALMIC
  Filled 2017-10-30: qty 4

## 2017-10-30 MED ORDER — KETOROLAC TROMETHAMINE 60 MG/2ML IM SOLN
60.0000 mg | Freq: Once | INTRAMUSCULAR | Status: AC
  Administered 2017-10-30: 60 mg via INTRAMUSCULAR

## 2017-10-30 MED ORDER — DEXAMETHASONE SODIUM PHOSPHATE 10 MG/ML IJ SOLN
10.0000 mg | Freq: Once | INTRAMUSCULAR | Status: AC
  Administered 2017-10-30: 10 mg via INTRAMUSCULAR

## 2017-10-30 MED ORDER — HYDROCODONE-ACETAMINOPHEN 5-325 MG PO TABS
2.0000 | ORAL_TABLET | Freq: Once | ORAL | Status: AC
  Administered 2017-10-30: 2 via ORAL
  Filled 2017-10-30: qty 2

## 2017-10-30 NOTE — ED Triage Notes (Signed)
Reports 2 days of headache and pressure around left eye radiating to base of skull; ibuprofen 800mg  has not worked; heat is helpful; some light sensitivity.

## 2017-10-30 NOTE — ED Provider Notes (Signed)
[facility]-EMERGENCY DEPT Provider Note   CSN: [id number] Arrival date & time: 10/30/17  1518     History   Chief Complaint Chief Complaint  Patient presents with  . Headache    HPI Angela [name] is a 36 y.o. female.  The history is provided by the patient. No language interpreter was used.  Headache   This is a new problem. The current episode started 12 to 24 hours ago. The problem occurs constantly. The problem has been gradually worsening. The headache is associated with nothing. The pain is located in the left unilateral region. The quality of the pain is described as sharp. The patient is experiencing no pain. She has tried nothing for the symptoms. The treatment provided no relief.  Pt complains of having a painful left eye and headache behind left eye.   Pt was seen at urgent care and sent here to have intraoccular pressure evaluated.    Past Medical History:  Diagnosis Date  . Abnormal Pap smear    repeat ok  . Fibromyalgia 09/02/2017  . Herpes zoster   . Panic attacks   . PCOS (polycystic ovarian syndrome)    conceived on metformin and clomid  . PP SVD 12/02/11 M 12/03/2011    Patient Active Problem List   Diagnosis Date Noted  . Fibromyalgia 09/02/2017  . Chronic pain syndrome 07/06/2017  . Polyarthralgia 03/31/2017  . Cervical paraspinal muscle spasm 03/31/2017  . Fatigue 02/07/2017  . Routine general medical examination at a health care facility 02/11/2015  . LLQ pain 02/11/2015  . PP SVD 12/02/11 35wks M 12/03/2011  . POLYCYSTIC OVARIAN DISEASE 01/11/2011  . Anxiety and depression 01/06/2010  . ALLERGIC RHINITIS 01/06/2010  . WISDOM TEETH EXTRACTION, HX OF 01/06/2010    Past Surgical History:  Procedure Laterality Date  . BREAST BIOPSY    . WISDOM TOOTH EXTRACTION      OB History    Gravida Para Term Preterm AB Living   1 1 0 1 0 1   SAB TAB Ectopic Multiple Live Births   0 0 0 0 1       Home Medications    Prior  to Admission medications   Medication Sig Start Date End Date Taking? Authorizing Provider  ALPRAZolam Prudy Feeler(XANAX) 0.5 MG tablet Take 1 tablet (0.5 mg total) by mouth 2 (two) times daily as needed for anxiety or sleep. 02/07/17  Yes Veryl Speakalone, Gregory D, FNP  cyclobenzaprine (FLEXERIL) 10 MG tablet Take 0.5-1 tablets (5-10 mg total) by mouth 3 (three) times daily as needed for muscle spasms. 07/29/17  Yes Veryl Speakalone, Gregory D, FNP  Ibuprofen-Famotidine 800-26.6 MG TABS Take 1 tablet by mouth 3 (three) times daily as needed. 07/06/17  Yes Veryl Speakalone, Gregory D, FNP  Multiple Vitamin (MULTIVITAMIN WITH MINERALS) TABS tablet Take 1 tablet by mouth daily.   Yes [provider]  pregabalin (LYRICA) 50 MG capsule Take 1 capsule (50 mg total) by mouth 2 (two) times daily. 09/02/17  Yes Corwin LevinsJohn, James W, MD  PRESCRIPTION MEDICATION Inject 1 Dose into the muscle once.   Yes [provider]  sertraline (ZOLOFT) 100 MG tablet Take 1 tablet (100 mg total) by mouth daily. 09/02/17  Yes Corwin LevinsJohn, James W, MD  valACYclovir (VALTREX) 500 MG tablet Take 500 mg by mouth as needed for lip care. 10/05/17   [provider]    Family History Family History  Problem Relation Age of Onset  . Diabetes Father  uncontrolled  . Hypertension Father   . Hyperlipidemia Father   . Alcohol abuse Other   . Arthritis Other   . Ovarian cancer Paternal Grandmother   . Anesthesia problems Neg Hx     Social History Social History   Tobacco Use  . Smoking status: Former Smoker    Last attempt to quit: 11/01/2009    Years since quitting: 8.0  . Smokeless tobacco: Never Used  Substance Use Topics  . Alcohol use: Yes    Alcohol/week: 1.2 oz    Types: 2 Cans of beer per week  . Drug use: No     Allergies   Patient has no known allergies.   Review of Systems Review of Systems  Neurological: Positive for headaches.  All other systems reviewed and are negative.    Physical Exam Updated Vital Signs BP 123/65  (BP Location: Left Arm)   Pulse 99   Temp 98.4 F (36.9 C) (Oral)   Resp 14   SpO2 100%   Physical Exam  Constitutional: She appears well-developed and well-nourished. No distress.  HENT:  Head: Normocephalic and atraumatic.  Mouth/Throat: Oropharynx is clear and moist.  Eyes: Conjunctivae and EOM are normal. Pupils are equal, round, and reactive to light. No scleral icterus. Right eye exhibits normal extraocular motion and no nystagmus. Left eye exhibits normal extraocular motion and no nystagmus. Pupils are equal.  Neck: Neck supple.  Cardiovascular: Normal rate and regular rhythm.  No murmur heard. Pulmonary/Chest: Effort normal and breath sounds normal. No respiratory distress.  Abdominal: Soft. There is no tenderness.  Musculoskeletal: She exhibits no edema.  Neurological: She is alert.  Skin: Skin is warm and dry.  Psychiatric: She has a normal mood and affect.  Nursing note and vitals reviewed.    ED Treatments / Results  Labs (all labs ordered are listed, but only abnormal results are displayed) Labs Reviewed - No data to display  EKG  EKG Interpretation None       Radiology Ct Head Wo Contrast  Result Date: 10/30/2017 CLINICAL DATA:  Headache acute EXAM: CT HEAD WITHOUT CONTRAST TECHNIQUE: Contiguous axial images were obtained from the base of the skull through the vertex without intravenous contrast. COMPARISON:  None. FINDINGS: Brain: No evidence of acute infarction, hemorrhage, hydrocephalus, extra-axial collection or mass lesion/mass effect. Vascular: Negative for hyperdense vessel Skull: Negative Sinuses/Orbits: Negative Other: None IMPRESSION: Negative CT head Electronically Signed   By: Marlan Palauharles  Clark M.D.   On: 10/30/2017 21:11   Tonopen presures right 15. Left 16,  Repeated to verify.   Procedures Procedures (including critical care time)  Medications Ordered in ED Medications  tetracaine (PONTOCAINE) 0.5 % ophthalmic solution 2 drop (2 drops Both  Eyes Given 10/30/17 1959)  HYDROcodone-acetaminophen (NORCO/VICODIN) 5-325 MG per tablet 2 tablet (2 tablets Oral Given 10/30/17 2247)     Initial Impression / Assessment and Plan / ED Course  I have reviewed the triage vital signs and the nursing notes.  Pertinent labs & imaging results that were available during my care of the patient were reviewed by me and considered in my medical decision making (see chart for details).     Pt given 2 hydrocodone for symptoms.  She is advised rest, folllow up with Opthalmology if symptoms persist    Final Clinical Impressions(s) / ED Diagnoses   Final diagnoses:  Bad headache  Left eye pain    ED Discharge Orders    None    An After Visit Summary was printed  and given to the patient.    Elson Areas, PA-C 10/30/17 2306    Rolan Bucco, MD 10/30/17 [phone]

## 2017-10-30 NOTE — Discharge Instructions (Signed)
See the Opthomologist for evaluation.

## 2017-10-30 NOTE — ED Provider Notes (Addendum)
[facility]    CSN: [id number] Arrival date & time: 10/30/17  1231     History   Chief Complaint Chief Complaint  Patient presents with  . Headache  . Eye Problem    left    HPI Angela [name] is a 36 y.o. female.   HPI  Angela [name] is a 36 y.o. female presenting to UC with c/o2 days of Left sided headache with pressure around Left eye radiating into the base of her skull.  Pain is 4/10 but constant.  Some light sensitivity.  She tried 800mg  ibuprofen and flexeril w/o relief.  Heat did help some the other day.  No known injury. No hx of migraines. No family hx of migraines. No cough or congestion. No fever, chills, n/v/d. No increased stress or other recent life changes.    Past Medical History:  Diagnosis Date  . Abnormal Pap smear    repeat ok  . Fibromyalgia 09/02/2017  . Herpes zoster   . Panic attacks   . PCOS (polycystic ovarian syndrome)    conceived on metformin and clomid  . PP SVD 12/02/11 M 12/03/2011    Patient Active Problem List   Diagnosis Date Noted  . Fibromyalgia 09/02/2017  . Chronic pain syndrome 07/06/2017  . Polyarthralgia 03/31/2017  . Cervical paraspinal muscle spasm 03/31/2017  . Fatigue 02/07/2017  . Routine general medical examination at a health care facility 02/11/2015  . LLQ pain 02/11/2015  . PP SVD 12/02/11 35wks M 12/03/2011  . POLYCYSTIC OVARIAN DISEASE 01/11/2011  . Anxiety and depression 01/06/2010  . ALLERGIC RHINITIS 01/06/2010  . WISDOM TEETH EXTRACTION, HX OF 01/06/2010    Past Surgical History:  Procedure Laterality Date  . BREAST BIOPSY    . WISDOM TOOTH EXTRACTION      OB History    Gravida Para Term Preterm AB Living   1 1 0 1 0 1   SAB TAB Ectopic Multiple Live Births   0 0 0 0 1       Home Medications    Prior to Admission medications   Medication Sig Start Date End Date Taking? Authorizing Provider  ALPRAZolam Prudy Feeler) 0.5 MG tablet Take 1 tablet (0.5 mg total) by mouth 2 (two) times  daily as needed for anxiety or sleep. 02/07/17   Veryl Speak, FNP  cyclobenzaprine (FLEXERIL) 10 MG tablet Take 0.5-1 tablets (5-10 mg total) by mouth 3 (three) times daily as needed for muscle spasms. 07/29/17   Veryl Speak, FNP  Ibuprofen-Famotidine 800-26.6 MG TABS Take 1 tablet by mouth 3 (three) times daily as needed. 07/06/17   Veryl Speak, FNP  pregabalin (LYRICA) 50 MG capsule Take 1 capsule (50 mg total) by mouth 2 (two) times daily. 09/02/17   Corwin Levins, MD  sertraline (ZOLOFT) 100 MG tablet Take 1 tablet (100 mg total) by mouth daily. 09/02/17   Corwin Levins, MD    Family History Family History  Problem Relation Age of Onset  . Diabetes Father        uncontrolled  . Hypertension Father   . Hyperlipidemia Father   . Alcohol abuse Other   . Arthritis Other   . Ovarian cancer Paternal Grandmother   . Anesthesia problems Neg Hx     Social History Social History   Tobacco Use  . Smoking status: Former Smoker    Last attempt to quit: 11/01/2009    Years since quitting: 8.0  . Smokeless tobacco: Never Used  Substance Use Topics  . Alcohol use: Yes    Alcohol/week: 1.2 oz    Types: 2 Cans of beer per week  . Drug use: No     Allergies   Patient has no known allergies.   Review of Systems Review of Systems  Constitutional: Negative for chills and fever.  HENT: Negative for congestion, ear pain, sore throat, trouble swallowing and voice change.   Eyes: Positive for photophobia and pain ( left eye pressure). Negative for visual disturbance.  Respiratory: Negative for cough and shortness of breath.   Cardiovascular: Negative for chest pain and palpitations.  Gastrointestinal: Negative for abdominal pain, diarrhea, nausea and vomiting.  Musculoskeletal: Negative for arthralgias, back pain and myalgias.  Skin: Negative for rash.  Neurological: Positive for headaches. Negative for dizziness and light-headedness.     Physical Exam Triage Vital Signs ED  Triage Vitals  Enc Vitals Group     BP 10/30/17 1249 119/73     Pulse Rate 10/30/17 1249 (!) 116     Resp 10/30/17 1249 16     Temp 10/30/17 1249 98.8 F (37.1 C)     Temp Source 10/30/17 1249 Oral     SpO2 10/30/17 1249 100 %     Weight 10/30/17 1253 140 lb (63.5 kg)     Height 10/30/17 1253 5\' 7"  (1.702 m)     Head Circumference --      Peak Flow --      Pain Score --      Pain Loc --      Pain Edu? --      Excl. in GC? --    No data found.  Updated Vital Signs BP 119/73 (BP Location: Right Arm)   Pulse 96   Temp 98.8 F (37.1 C) (Oral)   Resp 16   Ht 5\' 7"  (1.702 m)   Wt 140 lb (63.5 kg)   SpO2 98%   BMI 21.93 kg/m   Visual Acuity Right Eye Distance: 20/20 Left Eye Distance: 20/20 Bilateral Distance: without correction  Right Eye Near:   Left Eye Near:    Bilateral Near:     Physical Exam  Constitutional: She is oriented to person, place, and time. She appears well-developed and well-nourished.  Non-toxic appearance. She does not appear ill. No distress.  HENT:  Head: Normocephalic and atraumatic.  Right Ear: Tympanic membrane normal.  Left Ear: Tympanic membrane normal.  Nose: Nose normal.  Mouth/Throat: Uvula is midline, oropharynx is clear and moist and mucous membranes are normal.  Eyes: EOM are normal. Pupils are equal, round, and reactive to light. Right eye exhibits normal extraocular motion and no nystagmus. Left eye exhibits normal extraocular motion and no nystagmus.  Neck: Normal range of motion. Neck supple.  No nuchal rigidity or meningeal signs.   Cardiovascular: Regular rhythm. Tachycardia present.  Pulmonary/Chest: Effort normal.  Musculoskeletal: Normal range of motion.  Neurological: She is alert and oriented to person, place, and time. She has normal strength. She is not disoriented. No cranial nerve deficit. She displays a negative Romberg sign. Gait normal. GCS eye subscore is 4. GCS verbal subscore is 5. GCS motor subscore is 6.  Skin:  Skin is warm and dry.  Psychiatric: She has a normal mood and affect. Her behavior is normal.  Nursing note and vitals reviewed.    UC Treatments / Results  Labs (all labs ordered are listed, but only abnormal results are displayed) Labs Reviewed - No data to display  EKG  EKG Interpretation None       Radiology No results found.  Procedures Procedures (including critical care time)  Medications Ordered in UC Medications  dexamethasone (DECADRON) injection 10 mg (10 mg Intramuscular Given 10/30/17 1331)  metoCLOPramide (REGLAN) injection 5 mg (5 mg Intramuscular Given 10/30/17 1331)  ketorolac (TORADOL) injection 60 mg (60 mg Intramuscular Given 10/30/17 1331)     Initial Impression / Assessment and Plan / UC Course  I have reviewed the triage vital signs and the nursing notes.  Pertinent labs & imaging results that were available during my care of the patient were reviewed by me and considered in my medical decision making (see chart for details).     Pt c/o Left sided HA with Left eye pressure Normal vision and normal neuro exam Migraine cocktail of Toradol 60mg  IM, Reglan 5mg  IM, and Decradron 10mg  IM given in UC Pt allowed to rest in darkened exam room and given PO fluids for 30-40 minutes Pulse improved to 96 bpm but pain has not changed. Still 4/10 Doubt SAH or CVA at this time, however, due to continued eye pressure, and no instruments to measure in UC, recommend pt go to ED for further evaluation and treatment of pain/discomfort. DDx glaucoma, temporal arteritis, cluster HA   Pt agreeable and plans to go to [facility]. Pt stable for discharge via POV with husband driving.   Final Clinical Impressions(s) / UC Diagnoses   Final diagnoses:  Left-sided headache  Left eye pain    ED Discharge Orders    None       Controlled Substance Prescriptions [place] Controlled Substance Registry consulted? Not Applicable   Rolla Platehelps, Lew Prout O, PA-C 10/30/17 1414      Lurene ShadowPhelps, Melanee Cordial O, [place]PA-C 10/30/17 1416

## 2017-10-30 NOTE — ED Notes (Signed)
Pt stated to writer that she had a visual acuity screening done today (10/30/17) at a [facility] facility and the results were as followed:  Bilateral 20/20 Left 20/20 Right 20/20  PA and MD aware.

## 2017-10-30 NOTE — ED Triage Notes (Addendum)
Patient reports Friday morning waking with a headache behind the left eye. Patient went to UC earlier today and given IM migraine cocktail. Pt reports no relief and was encouraged to follow up here. Denies any vision changes. Mild light sensitivity. Denies N/V/D.

## 2017-10-30 NOTE — ED Notes (Signed)
Tetracaine at bedside

## 2017-11-04 ENCOUNTER — Ambulatory Visit (HOSPITAL_COMMUNITY)
Admission: RE | Admit: 2017-11-04 | Discharge: 2017-11-04 | Disposition: A | Discharge status: Home / Self Care | Payer: No Typology Code available for payment source | Source: Ambulatory Visit | Attending: Ophthalmology | Admitting: Ophthalmology

## 2017-11-04 ENCOUNTER — Other Ambulatory Visit (HOSPITAL_COMMUNITY): Payer: Self-pay | Admitting: Ophthalmology

## 2017-11-04 DIAGNOSIS — H47092 Other disorders of optic nerve, not elsewhere classified, left eye: Secondary | ICD-10-CM | POA: Insufficient documentation

## 2017-11-04 DIAGNOSIS — H0520 Unspecified exophthalmos: Secondary | ICD-10-CM

## 2017-11-04 DIAGNOSIS — H5704 Mydriasis: Secondary | ICD-10-CM | POA: Insufficient documentation

## 2017-11-04 MED ORDER — GADOBENATE DIMEGLUMINE 529 MG/ML IV SOLN
15.0000 mL | Freq: Once | INTRAVENOUS | Status: AC | PRN
  Administered 2017-11-04: 14 mL via INTRAVENOUS

## 2017-12-01 MED FILL — LYRICA 50 MG CAPSULE: 50 | 90 days supply | Qty: 180 | Fill #1

## 2017-12-20 ENCOUNTER — Encounter: Payer: Self-pay | Admitting: Nurse Practitioner

## 2017-12-20 ENCOUNTER — Ambulatory Visit (INDEPENDENT_AMBULATORY_CARE_PROVIDER_SITE_OTHER): Payer: No Typology Code available for payment source | Admitting: Nurse Practitioner

## 2017-12-20 VITALS — BP 132/80 | HR 83 | Temp 98.8°F | Resp 16 | Ht 67.0 in | Wt 142.8 lb

## 2017-12-20 DIAGNOSIS — F32A Depression, unspecified: Secondary | ICD-10-CM

## 2017-12-20 DIAGNOSIS — M25551 Pain in right hip: Secondary | ICD-10-CM

## 2017-12-20 DIAGNOSIS — G894 Chronic pain syndrome: Secondary | ICD-10-CM | POA: Diagnosis not present

## 2017-12-20 DIAGNOSIS — F419 Anxiety disorder, unspecified: Secondary | ICD-10-CM

## 2017-12-20 DIAGNOSIS — M797 Fibromyalgia: Secondary | ICD-10-CM

## 2017-12-20 DIAGNOSIS — F329 Major depressive disorder, single episode, unspecified: Secondary | ICD-10-CM | POA: Diagnosis not present

## 2017-12-20 MED ORDER — PREGABALIN 75 MG PO CAPS: 75.0000 mg | ORAL_CAPSULE | Freq: Two times a day (BID) | ORAL | 2 refills | Status: DC

## 2017-12-20 MED FILL — LYRICA 75 MG CAPSULE: 75 | 30 days supply | Qty: 60 | Fill #0

## 2017-12-20 NOTE — Patient Instructions (Addendum)
For your pain, lets increase your lyrica to 75mg  twice daily. Please consider utilizing EAP for counseling as well as the [facility] discount for massages.   For your hip pain, please schedule a follow up with one of our sports medicine providers, the front desk will help you with this.  I'd like to see you back in about 1 month, to see how you are doing. Hang in there!!!    Myofascial Pain Syndrome and Fibromyalgia Myofascial pain syndrome and fibromyalgia are both pain disorders. This pain may be felt mainly in your muscles.  Myofascial pain syndrome: ? Always has trigger points or tender points in the muscle that will cause pain when pressed. The pain may come and go. ? Usually affects your neck, upper back, and shoulder areas. The pain often radiates into your arms and hands.  Fibromyalgia: ? Has muscle pains and tenderness that come and go. ? Is often associated with fatigue and sleep disturbances. ? Has trigger points. ? Tends to be long-lasting (chronic), but is not life-threatening.  Fibromyalgia and myofascial pain are not the same. However, they often occur together. If you have both conditions, each can make the other worse. Both are common and can cause enough pain and fatigue to make day-to-day activities difficult. What are the causes? The exact causes of fibromyalgia and myofascial pain are not known. People with certain gene types may be more likely to develop fibromyalgia. Some factors can be triggers for both conditions, such as:  Spine disorders.  Arthritis.  Severe injury (trauma) and other physical stressors.  Being under a lot of stress.  A medical illness.  What are the signs or symptoms? Fibromyalgia The main symptom of fibromyalgia is widespread pain and tenderness in your muscles. This can vary over time. Pain is sometimes described as stabbing, shooting, or burning. You may have tingling or numbness, too. You may also have sleep problems and fatigue. You may  wake up feeling tired and groggy (fibro fog). Other symptoms may include:  Bowel and bladder problems.  Headaches.  Visual problems.  Problems with odors and noises.  Depression or mood changes.  Painful menstrual periods (dysmenorrhea).  Dry skin or eyes.  Myofascial pain syndrome Symptoms of myofascial pain syndrome include:  Tight, ropy bands of muscle.  Uncomfortable sensations in muscular areas, such as: ? Aching. ? Cramping. ? Burning. ? Numbness. ? Tingling. ? Muscle weakness.  Trouble moving certain muscles freely (range of motion).  How is this diagnosed? There are no specific tests to diagnose fibromyalgia or myofascial pain syndrome. Both can be hard to diagnose because their symptoms are common in many other conditions. Your health care provider may suspect one or both of these conditions based on your symptoms and medical history. Your health care provider will also do a physical exam. The key to diagnosing fibromyalgia is having pain, fatigue, and other symptoms for more than three months that cannot be explained by another condition. The key to diagnosing myofascial pain syndrome is finding trigger points in muscles that are tender and cause pain elsewhere in your body (referred pain). How is this treated? Treating fibromyalgia and myofascial pain often requires a team of health care providers. This usually starts with your primary provider and a physical therapist. You may also find it helpful to work with alternative health care providers, such as massage therapists or acupuncturists. Treatment for fibromyalgia may include medicines. This may include nonsteroidal anti-inflammatory drugs (NSAIDs), along with other medicines. Treatment for myofascial pain may  also include:  NSAIDs.  Cooling and stretching of muscles.  Trigger point injections.  Sound wave (ultrasound) treatments to stimulate muscles.  Follow these instructions at home:  Take medicines  only as directed by your health care provider.  Exercise as directed by your health care provider or physical therapist.  Try to avoid stressful situations.  Practice relaxation techniques to control your stress. You may want to try: ? Biofeedback. ? Visual imagery. ? Hypnosis. ? Muscle relaxation. ? Yoga. ? Meditation.  Talk to your health care provider about alternative treatments, such as acupuncture or massage treatment.  Maintain a healthy lifestyle. This includes eating a healthy diet and getting enough sleep.  Consider joining a support group.  Do not do activities that stress or strain your muscles. That includes repetitive motions and heavy lifting. Where to find more information:  National Fibromyalgia Association: www.fmaware.org  Arthritis Foundation: www.arthritis.org  American Chronic Pain Association: GumSearch.nl Contact a health care provider if:  You have new symptoms.  Your symptoms get worse.  You have side effects from your medicines.  You have trouble sleeping.  Your condition is causing depression or anxiety. This information is not intended to replace advice given to you by your health care provider. Make sure you discuss any questions you have with your health care provider. Document Released: 10/18/2005 Document Revised: 03/25/2016 Document Reviewed: 07/24/2014 Elsevier Interactive Patient Education  [employer].

## 2017-12-20 NOTE — Assessment & Plan Note (Signed)
Maintained on zoloft 100 daily. She does report more anxiety and depression recently  r/t FM diagnosis We will work on controlling her FM which should hopefully improve her anxiety and depression- increasing dosage ofr lyrica today Encouraged to F/U with her free counseling program at work Return for F/u in 1 month

## 2017-12-20 NOTE — Assessment & Plan Note (Signed)
We discussed daily maintenance of FM including maximizing her current medication to help with her daily symptoms and she is agreeable to increase lyrica. We will try to increase to 75 BID and she will return in 1 month for follow up. We also discussed alternate methods to treat her symptoms at home including relaxtion techniques, stress reduction and NSAIDS prn- See AVS for education provided to patient She is interested in alternative therapies and reduced cost massages and counseling are available through her employer- she will check into these options. She is thinking about applying for FMLA as a back up, although she has continued to work full time over the past year while being diagnosed with chronic pain syndrome. I think it would be reasonable to give her 1-2 days a month FMLA if needed, she will let me know. She was encouraged to stay active, exercise daily and think positive! - pregabalin (LYRICA) 75 MG capsule; Take 1 capsule (75 mg total) by mouth 2 (two) times daily.  Dispense: 60 capsule; Refill: 2 

## 2017-12-20 NOTE — Progress Notes (Signed)
Name: Angela [name]   MRN: [id number]    DOB: [birth year]   Date:12/20/2017       Progress Note  Subjective  Chief Complaint  Chief Complaint  Patient presents with  . Establish Care    fatigue, joint pain, pain right hip and buttocks area    HPI    Angela [name] is establishing care with me today. She presents for follow up of fatigue and joint pain. This problem began about 1 year ago.  She was diagnosed with chronic pain syndrome by prior provider after significant workup for her chronic pain and fatigue including referrals to rheumatology and neurology. Rheumatology evaluation included negative findings for RA or lupus and neurology evaluation included  normal neurological exam and PE consistent with fibromyalgia. She was started on lyrica and is currently taking 50 BID, but has not noticed much change with the medication. She denies any adverse effects to lyrica including dizziness, vision changes. 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 She reports she has constant aching in her joints and feels tired all the time, especially after a long shift at work. She especially has pain in her right hip, which has been present for some time. She does not recall any injuries to the hip. She denies weakness in her leg, falls, swelling or discoloration.    She also suffers from anxiety and depression, which she feels has been somewhat worse because of her chronic diagnosis. She has continued to take zoloft 100 daily for her anxiety and depression. She denies any thoughts of hurting herself or others. She has been researching fibromyalgia and is interested in massage therapy and other alternate options, but hesitates to increase her medication dosages. She says that she is trying to stay active despite feeling bad. She has been on cymbalta and gabapentin in the past which she did not like taking.    She denies fevers, confusion, chest pain, shortness of breath,  abnormal bruising or bleeding, rash.  Depression screen [facility] 2/9 12/20/2017 09/02/2017 08/18/2016  Decreased Interest 2 0 0  Down, Depressed, Hopeless 1 0 0  PHQ - 2 Score 3 0 0  Altered sleeping 2 0 -  Tired, decreased energy 2 0 -  Change in appetite 2 0 -  Feeling bad or failure about yourself  3 0 -  Trouble concentrating 1 0 -  Moving slowly or fidgety/restless 1 0 -  Suicidal thoughts 0 0 -  PHQ-9 Score 14 0 -   GAD 7 : Generalized Anxiety Score 12/20/2017  Nervous, Anxious, on Edge 2  Control/stop worrying 2  Worry too much - different things 2  Trouble relaxing 2  Restless 1  Easily annoyed or irritable 3  Afraid - awful might happen 1  Total GAD 7 Score 13      Patient Active Problem List   Diagnosis Date Noted  . Fibromyalgia 09/02/2017  . Chronic pain syndrome 07/06/2017  . Polyarthralgia 03/31/2017  . Cervical paraspinal muscle spasm 03/31/2017  . Fatigue 02/07/2017  . Routine general medical examination at a health care facility 02/11/2015  . LLQ pain 02/11/2015  . PP SVD 12/02/11 35wks M 12/03/2011  . POLYCYSTIC OVARIAN DISEASE 01/11/2011  . Anxiety and depression 01/06/2010  . ALLERGIC RHINITIS 01/06/2010  . WISDOM TEETH EXTRACTION, HX OF 01/06/2010    Past Surgical History:  Procedure Laterality Date  . BREAST BIOPSY    . WISDOM TOOTH EXTRACTION  Family History  Problem Relation Age of Onset  . Diabetes Father        uncontrolled  . Hypertension Father   . Hyperlipidemia Father   . Alcohol abuse Other   . Arthritis Other   . Ovarian cancer Paternal Grandmother   . Anesthesia problems Neg Hx     Social History   Socioeconomic History  . Marital status: Married    Spouse name: Not on file  . Number of children: 1  . Years of education: 48  . Highest education level: Not on file  Social Needs  . Financial resource strain: Not on file  . Food insecurity - worry: Not on file  . Food insecurity - inability: Not on file  .  Transportation needs - medical: Not on file  . Transportation needs - non-medical: Not on file  Occupational History  . Occupation: Systems developer  Tobacco Use  . Smoking status: Former Smoker    Last attempt to quit: 11/01/2009    Years since quitting: 8.1  . Smokeless tobacco: Never Used  Substance and Sexual Activity  . Alcohol use: Yes    Alcohol/week: 1.2 oz    Types: 2 Cans of beer per week  . Drug use: No  . Sexual activity: Yes  Other Topics Concern  . Not on file  Social History Narrative   Works as a Systems developer; Fun: Angela [name] out with family.    Denies any religious beliefs effecting health care.    Lives with husband and son in a 2 story home.     Education: college.      Current Outpatient Medications:  .  ALPRAZolam (XANAX) 0.5 MG tablet, Take 1 tablet (0.5 mg total) by mouth 2 (two) times daily as needed for anxiety or sleep., Disp: 30 tablet, Rfl: 1 .  cyclobenzaprine (FLEXERIL) 10 MG tablet, Take 0.5-1 tablets (5-10 mg total) by mouth 3 (three) times daily as needed for muscle spasms., Disp: 60 tablet, Rfl: 0 .  Ibuprofen-Famotidine 800-26.6 MG TABS, Take 1 tablet by mouth 3 (three) times daily as needed., Disp: 90 tablet, Rfl: 5 .  Multiple Vitamin (MULTIVITAMIN WITH MINERALS) TABS tablet, Take 1 tablet by mouth daily., Disp: , Rfl:  .  pregabalin (LYRICA) 50 MG capsule, Take 1 capsule (50 mg total) by mouth 2 (two) times daily., Disp: 180 capsule, Rfl: 1 .  PRESCRIPTION MEDICATION, Inject 1 Dose into the muscle once., Disp: , Rfl:  .  Probiotic Product (PROBIOTIC ADVANCED PO), Take by mouth., Disp: , Rfl:  .  sertraline (ZOLOFT) 100 MG tablet, Take 1 tablet (100 mg total) by mouth daily., Disp: 90 tablet, Rfl: 3 .  valACYclovir (VALTREX) 500 MG tablet, Take 500 mg by mouth as needed for lip care., Disp: , Rfl: 4  No Known Allergies   ROS See HPI  Objective  Vitals:   12/20/17 1000  BP: 132/80  Pulse: 83  Resp: 16  Temp: 98.8 F (37.1 C)   TempSrc: Oral  SpO2: 94%  Weight: 142 lb 12.8 oz (64.8 kg)  Height: 5\' 7"  (1.702 m)   Body mass index is 22.37 kg/m.  Physical Exam Vital signs reviewed. Constitutional: Patient appears well-developed and well-nourished. No distress.  HENT: Head: Normocephalic and atraumatic. Nose: Nose normal. Mouth/Throat: Oropharynx is clear and moist. No oropharyngeal exudate.  Eyes: Conjunctivae and EOM are normal. No scleral icterus.  Neck: Normal range of motion. Neck supple.  Cardiovascular: Normal rate, regular rhythm and normal heart sounds.  No  murmur heard. No BLE edema. Distal pulses intact. Pulmonary/Chest: Effort normal and breath sounds normal. No respiratory distress. Abdominal: Soft. Bowel sounds are normal, no distension.  Musculoskeletal: Normal range of motion, no joint effusions. No gross deformities Neurological: She is alert and oriented to person, place, and time.  Coordination, balance, strength, speech and gait are normal.  Skin: Skin is warm and dry. No rash noted. No erythema.  Psychiatric: Patient has a normal mood and affect. behavior is normal. Judgment and thought content normal.  Fall Risk: Fall Risk  06/29/2017 08/18/2016  Falls in the past year? No No   Assessment & Plan RTC in 1 month for follow up of FM, anxiety and depression- increasing lyrica dosage today  -Reviewed Health Maintenance: up to date   Right hip pain ?could be related to FM She would like further evaluation of this pain Recommend referral to our sports medicine providers- she will schedule an appointment today

## 2017-12-20 NOTE — Assessment & Plan Note (Signed)
We discussed daily maintenance of FM including maximizing her current medication to help with her daily symptoms and she is agreeable to increase lyrica. We will try to increase to 75 BID and she will return in 1 month for follow up. We also discussed alternate methods to treat her symptoms at home including relaxtion techniques, stress reduction and NSAIDS prn- See AVS for education provided to patient She is interested in alternative therapies and reduced cost massages and counseling are available through her employer- she will check into these options. She is thinking about applying for FMLA as a back up, although she has continued to work full time over the past year while being diagnosed with chronic pain syndrome. I think it would be reasonable to give her 1-2 days a month FMLA if needed, she will let me know. She was encouraged to stay active, exercise daily and think positive! - pregabalin (LYRICA) 75 MG capsule; Take 1 capsule (75 mg total) by mouth 2 (two) times daily.  Dispense: 60 capsule; Refill: 2

## 2017-12-22 ENCOUNTER — Ambulatory Visit (INDEPENDENT_AMBULATORY_CARE_PROVIDER_SITE_OTHER): Payer: No Typology Code available for payment source | Admitting: Family Medicine

## 2017-12-22 ENCOUNTER — Encounter: Payer: Self-pay | Admitting: Family Medicine

## 2017-12-22 VITALS — BP 110/66 | HR 103 | Temp 98.6°F | Ht 67.0 in | Wt 142.0 lb

## 2017-12-22 DIAGNOSIS — M25551 Pain in right hip: Secondary | ICD-10-CM | POA: Diagnosis not present

## 2017-12-22 DIAGNOSIS — M357 Hypermobility syndrome: Secondary | ICD-10-CM

## 2017-12-22 MED ORDER — DICLOFENAC SODIUM 2 % TD SOLN: 1.0000 "application " | Freq: Two times a day (BID) | TRANSDERMAL | 3 refills | Status: DC

## 2017-12-22 NOTE — Progress Notes (Addendum)
Angela DillonHeather L Lavalley - 37 y.o. female MRN [id number]  Date of birth: 04-02-1981  SUBJECTIVE:  Including CC & ROS.  Chief Complaint  Patient presents with  . Right Hip pain    Angela [name] is a 37 y.o. female that is presenting with right hip pain. Ongoing for one year. Pain located in her greater trochanteric. Pain is constant worsens when she extends her right leg. She has been taking Duexis with no improvement. She was diagnosed with Fibromyalgia in November. Denies injury or prior surgeries. She works at [employer] is on her feet all day. Denies any improvement with current therapies. Is currently taking Lyrica. Has not tried any physical therapy.   Review of Systems  Constitutional: Negative for fever.  HENT: Negative for sinus pain.   Respiratory: Negative for cough.   Cardiovascular: Negative for chest pain.  Gastrointestinal: Negative for abdominal pain.  Musculoskeletal: Negative for gait problem.  Skin: Negative for color change.  Allergic/Immunologic: Negative for immunocompromised state.  Neurological: Negative for weakness.  Hematological: Negative for adenopathy.  Psychiatric/Behavioral: Negative for agitation.    HISTORY: Past Medical, Surgical, Social, and Family History Reviewed & Updated per EMR.   Pertinent Historical Findings include:  Past Medical History:  Diagnosis Date  . Abnormal Pap smear    repeat ok  . Fibromyalgia 09/02/2017  . Herpes zoster   . Panic attacks   . PCOS (polycystic ovarian syndrome)    conceived on metformin and clomid  . PP SVD 12/02/11 M 12/03/2011    Past Surgical History:  Procedure Laterality Date  . BREAST BIOPSY    . WISDOM TOOTH EXTRACTION      No Known Allergies  Family History  Problem Relation Age of Onset  . Diabetes Father        uncontrolled  . Hypertension Father   . Hyperlipidemia Father   . Alcohol abuse Other   . Arthritis Other   . Ovarian cancer Paternal Grandmother   . Anesthesia problems Neg Hx        Social History   Socioeconomic History  . Marital status: Married    Spouse name: Not on file  . Number of children: 1  . Years of education: 8816  . Highest education level: Not on file  Occupational History  . Occupation: Systems developeradiation Therapist  Social Needs  . Financial resource strain: Not on file  . Food insecurity:    Worry: Not on file    Inability: Not on file  . Transportation needs:    Medical: Not on file    Non-medical: Not on file  Tobacco Use  . Smoking status: Former Smoker    Last attempt to quit: 11/01/2009    Years since quitting: 8.2  . Smokeless tobacco: Never Used  Substance and Sexual Activity  . Alcohol use: Yes    Alcohol/week: 1.2 oz    Types: 2 Cans of beer per week  . Drug use: No  . Sexual activity: Yes  Lifestyle  . Physical activity:    Days per week: Not on file    Minutes per session: Not on file  . Stress: Not on file  Relationships  . Social connections:    Talks on phone: Not on file    Gets together: Not on file    Attends religious service: Not on file    Active member of club or organization: Not on file    Attends meetings of clubs or organizations: Not on file  Relationship status: Not on file  . Intimate partner violence:    Fear of current or ex partner: Not on file    Emotionally abused: Not on file    Physically abused: Not on file    Forced sexual activity: Not on file  Other Topics Concern  . 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    Denies any religious beliefs effecting health care.    Lives with husband and son in a 2 story home.     Education: college.      PHYSICAL EXAM:  VS: BP 110/66 (BP Location: Left Arm, Patient Position: Sitting, Cuff Size: Normal)   Pulse (!) 103   Temp 98.6 F (37 C) (Oral)   Ht 5\' 7"  (1.702 m)   Wt 142 lb (64.4 kg)   SpO2 97%   BMI 22.24 kg/m  Physical Exam Gen: NAD, alert, cooperative with exam,  well-appearing ENT: normal lips, normal nasal mucosa,  Eye: normal EOM, normal conjunctiva and lids CV:  no edema, +2 pedal pulses   Resp: no accessory muscle use, non-labored,  Skin: no rashes, no areas of induration  Neuro: normal tone, normal sensation to touch Psych:  normal insight, alert and oriented MSK:  Right hip: tenderness to palpation over the piriformis and greater trochanter. Normal external and internal rotation of the hip. Normal strength with hip flexion. Normal knee flexion and extension. Normal fadir and faber  Neurovascularly intact Bilateral hallux valgus  Able to take each thumb to the forearm. Able to extend the 5th digit past 90. Able to hyperextend the elbow and knees. Unable to put her hands but on the floor.     ASSESSMENT & PLAN:   I spent 25 minutes with this patient, greater than 50% was face-to-face time counseling regarding the below diagnosis.   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 Likely has a bit of instability as well as this weakness that exacerbates this problem. Does not appear to be intra-articular nature. - Counseled on home exercise therapy - Counseled on obtaining some insoles to help with her gait as she does have lateral hallux valgus. - If no improving would consider greater trochanter injection  Hypermobility syndrome Likely her pain is more likely associated with the small subluxations of her joints associated with the looseness of her ligaments. - Counseled on strengthening around the joints for stability. - Could try different medications to help decrease the amount of pain such as Cymbalta, nortriptyline

## 2017-12-22 NOTE — Patient Instructions (Signed)
Please try getting a pair of insoles that are comfortable and not to expensive. I have sent in a medication to help her up onto the area. Please focus on the exercises You can alternate heat and ice over this area. Please follow-up with me in 4-6 weeks if her symptoms are not improving.

## 2017-12-23 DIAGNOSIS — M357 Hypermobility syndrome: Secondary | ICD-10-CM | POA: Insufficient documentation

## 2017-12-23 DIAGNOSIS — M25551 Pain in right hip: Secondary | ICD-10-CM | POA: Insufficient documentation

## 2017-12-23 NOTE — Assessment & Plan Note (Signed)
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 Likely has a bit of instability as well as this weakness that exacerbates this problem. Does not appear to be intra-articular nature. - Counseled on home exercise therapy - Counseled on obtaining some insoles to help with her gait as she does have lateral hallux valgus. - If no improving would consider greater trochanter injection

## 2017-12-23 NOTE — Assessment & Plan Note (Addendum)
Likely her pain is more likely associated with the small subluxations of her joints associated with the looseness of her ligaments. - Counseled on strengthening around the joints for stability. - Could try different medications to help decrease the amount of pain such as Cymbalta, nortriptyline

## 2018-01-06 MED FILL — SERTRALINE HCL 100 MG TAB: 100 | 90 days supply | Qty: 90 | Fill #1

## 2018-01-18 ENCOUNTER — Encounter: Payer: Self-pay | Admitting: Nurse Practitioner

## 2018-01-18 ENCOUNTER — Ambulatory Visit (INDEPENDENT_AMBULATORY_CARE_PROVIDER_SITE_OTHER): Payer: No Typology Code available for payment source | Admitting: Nurse Practitioner

## 2018-01-18 VITALS — BP 116/78 | HR 98 | Temp 98.6°F | Resp 16 | Ht 67.0 in | Wt 146.0 lb

## 2018-01-18 DIAGNOSIS — M797 Fibromyalgia: Secondary | ICD-10-CM | POA: Diagnosis not present

## 2018-01-18 DIAGNOSIS — F32A Depression, unspecified: Secondary | ICD-10-CM

## 2018-01-18 DIAGNOSIS — F419 Anxiety disorder, unspecified: Secondary | ICD-10-CM

## 2018-01-18 DIAGNOSIS — F329 Major depressive disorder, single episode, unspecified: Secondary | ICD-10-CM | POA: Diagnosis not present

## 2018-01-18 MED ORDER — SERTRALINE HCL 100 MG PO TABS: 150.0000 mg | ORAL_TABLET | Freq: Every day | ORAL | 1 refills | Status: DC

## 2018-01-18 NOTE — Patient Instructions (Addendum)
I have increased your zoloft to 150 mg, or 1.5 tabs, daily. Please return in about 1 month, so I can see how you are doing If you are feeling well, we can do your annual physical.   Mindfulness-Based Stress Reduction Mindfulness-based stress reduction (MBSR) is a program that helps people learn to practice mindfulness. Mindfulness is the practice of intentionally paying attention to the present moment. It can be learned and practiced through techniques such as education, breathing exercises, meditation, and yoga. MBSR includes several mindfulness techniques in one program. MBSR works best when you understand the treatment, are willing to try new things, and can commit to spending time practicing what you learn. MBSR training may include learning about:  How your emotions, thoughts, and reactions affect your body.  New ways to respond to things that cause negative thoughts to start (triggers).  How to notice your thoughts and let go of them.  Practicing awareness of everyday things that you normally do without thinking.  The techniques and goals of different types of meditation.  What are the benefits of MBSR? MBSR can have many benefits, which include helping you to:  Develop self-awareness. This refers to knowing and understanding yourself.  Learn skills and attitudes that help you to participate in your own health care.  Learn new ways to care for yourself.  Be more accepting about how things are, and let things go.  Be less judgmental and approach things with an open mind.  Be patient with yourself and trust yourself more.  MBSR has also been shown to:  Reduce negative emotions, such as depression and anxiety.  Improve memory and focus.  Change how you sense and approach pain.  Boost your body's ability to fight infections.  Help you connect better with other people.  Improve your sense of well-being.  Follow these instructions at home:  Find a local in-person or  online MBSR program.  Set aside some time regularly for mindfulness practice.  Find a mindfulness practice that works best for you. This may include one or more of the following: ? Meditation. Meditation involves focusing your mind on a certain thought or activity. ? Breathing awareness exercises. These help you to stay present by focusing on your breath. ? Body scan. For this practice, you lie down and pay attention to each part of your body from head to toe. You can identify tension and soreness and intentionally relax parts of your body. ? Yoga. Yoga involves stretching and breathing, and it can improve your ability to move and be flexible. It can also provide an experience of testing your body's limits, which can help you release stress. ? Mindful eating. This way of eating involves focusing on the taste, texture, color, and smell of each bite of food. Because this slows down eating and helps you feel full sooner, it can be an important part of a weight-loss plan.  Find a podcast or recording that provides guidance for breathing awareness, body scan, or meditation exercises. You can listen to these any time when you have a free moment to rest without distractions.  Follow your treatment plan as told by your health care provider. This may include taking regular medicines and making changes to your diet or lifestyle as recommended. How to practice mindfulness To do a basic awareness exercise:  Find a comfortable place to sit.  Pay attention to the present moment. Observe your thoughts, feelings, and surroundings just as they are.  Avoid placing judgment on yourself, your feelings,  or your surroundings. Make note of any judgment that comes up, and let it go.  Your mind may wander, and that is okay. Make note of when your thoughts drift, and return your attention to the present moment.  To do basic mindfulness meditation:  Find a comfortable place to sit. This may include a stable chair or  a firm floor cushion. ? Sit upright with your back straight. Let your arms fall next to your side with your hands resting on your legs. ? If sitting in a chair, rest your feet flat on the floor. ? If sitting on a cushion, cross your legs in front of you.  Keep your head in a neutral position with your chin dropped slightly. Relax your jaw and rest the tip of your tongue on the roof of your mouth. Drop your gaze to the floor. You can close your eyes if you like.  Breathe normally and pay attention to your breath. Feel the air moving in and out of your nose. Feel your belly expanding and relaxing with each breath.  Your mind may wander, and that is okay. Make note of when your thoughts drift, and return your attention to your breath.  Avoid placing judgment on yourself, your feelings, or your surroundings. Make note of any judgment or feelings that come up, let them go, and bring your attention back to your breath.  When you are ready, lift your gaze or open your eyes. Pay attention to how your body feels after the meditation.  Where to find more information: You can find more information about MBSR from:  Your health care provider.  Community-based meditation centers or programs.  Programs offered near you.  Summary  Mindfulness-based stress reduction (MBSR) is a program that teaches you how to intentionally pay attention to the present moment. It is used with other treatments to help you cope better with daily stress, emotions, and pain.  MBSR focuses on developing self-awareness, which allows you to respond to life stress without judgment or negative emotions.  MBSR programs may involve learning different mindfulness practices, such as breathing exercises, meditation, yoga, body scan, or mindful eating. Find a mindfulness practice that works best for you, and set aside time for it on a regular basis. This information is not intended to replace advice given to you by your health care  provider. Make sure you discuss any questions you have with your health care provider. Document Released: 02/24/2017 Document Revised: 02/24/2017 Document Reviewed: 02/24/2017 Elsevier Interactive Patient Education  [employer].

## 2018-01-18 NOTE — Assessment & Plan Note (Signed)
Stable, continue lyrica at current dosage We discussed F/U for new or worsening symptoms or if she feels she is experiencing an adverse reaction to her medications

## 2018-01-18 NOTE — Progress Notes (Signed)
Name: Angela [name]   MRN: [id number]    DOB: Mar 06, 1981   Date:01/18/2018       Progress Note  Subjective  Chief Complaint  Chief Complaint  Patient presents with  . Follow-up    increased lyrica has helped with joint pain some    HPI  Ms Ruff is following up on increase in lyrica dosage.      At her last OV her lyrica was increased to 75 BID for uncontrolled symptoms of fibromyalgia- chronic pain and fatigue. This is a relatively new diagnosis for her, at last OV we also spent time discussing additional options to manage her symptoms including Relaxation, Stress reduction, NSAIDs prn and following up with her EAP through work for counseling.      Today she tells me that she did increase her lyrica dosage as instructed. She has noticed improvement in her symptoms including decreased pain and no longer notices that her skin feels so sensitive when touched. She is finding it easier to get up in the morning because she feels less stiff when she wakes up. She is sleeping 8 hours a night but still feels tired, however she has noticed a little bit more energy than she had at her last OV. She has noticed with the increased lyrica dosage she has felt like she is "off" at times but can not fully describe this feeling. She has also noticed feeling somewhat more anxious in the afternoons recently, feeling that she needs to call and check on family members to see if they are okay or worrying more than normal. She has been taking some leftover xanax once in the afternoon on some days when she feels more anxious and that does help calm her down. She has not been able to follow up with EAP for counseling yet but is still thinking about going. She denies any syncope, weakness, dizziness, vision changes, confusion, chest pain, shortness of breath, edema, skin discoloration, skin rash, weight gain.    Since our last visit she also started following with sports medicine provider for hip pain, which seemed to be  unrelated to her fibromyalgia, and she says her hip pain has improved and she plans to keep following up with sports medicine.  Patient Active Problem List   Diagnosis Date Noted  . Right hip pain 12/23/2017  . 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 Fibromyalgia 09/02/2017  . Chronic pain syndrome 07/06/2017  . Polyarthralgia 03/31/2017  . Fatigue 02/07/2017  . Routine general medical examination at a health care facility 02/11/2015  . LLQ pain 02/11/2015  . PP SVD 12/02/11 35wks M 12/03/2011  . POLYCYSTIC OVARIAN DISEASE 01/11/2011  . Anxiety and depression 01/06/2010  . ALLERGIC RHINITIS 01/06/2010    Past Surgical History:  Procedure Laterality Date  . BREAST BIOPSY    . WISDOM TOOTH EXTRACTION      Family History  Problem Relation Age of Onset  . Diabetes Father        uncontrolled  . Hypertension Father   . Hyperlipidemia Father   . Alcohol abuse Other   . Arthritis Other   . Ovarian cancer Paternal Grandmother   . Anesthesia problems Neg Hx     Social History   Socioeconomic History  . Marital status: Married    Spouse name: Not on file  . Number of children: 1  . Years of education: 41  . Highest education level: Not on file  Social Needs  . Financial resource strain: Not on file  .  Food insecurity - worry: Not on file  . Food insecurity - inability: Not on file  . Transportation needs - medical: Not on file  . Transportation needs - non-medical: Not on file  Occupational History  . Occupation: Systems developer  Tobacco Use  . Smoking status: Former Smoker    Last attempt to quit: 11/01/2009    Years since quitting: 8.2  . Smokeless tobacco: Never Used  Substance and Sexual Activity  . Alcohol use: Yes    Alcohol/week: 1.2 oz    Types: 2 Cans of beer per week  . Drug use: No  . Sexual activity: Yes  Other Topics Concern  . 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    Denies any religious  beliefs effecting health care.    Lives with husband and son in a 2 story home.     Education: college.      Current Outpatient Medications:  .  cyclobenzaprine (FLEXERIL) 10 MG tablet, Take 0.5-1 tablets (5-10 mg total) by mouth 3 (three) times daily as needed for muscle spasms., Disp: 60 tablet, Rfl: 0 .  Diclofenac Sodium (PENNSAID) 2 % SOLN, Place 1 application onto the skin 2 (two) times daily., Disp: 1 Bottle, Rfl: 3 .  Ibuprofen-Famotidine 800-26.6 MG TABS, Take 1 tablet by mouth 3 (three) times daily as needed., Disp: 90 tablet, Rfl: 5 .  Multiple Vitamin (MULTIVITAMIN WITH MINERALS) TABS tablet, Take 1 tablet by mouth daily., Disp: , Rfl:  .  pregabalin (LYRICA) 75 MG capsule, Take 1 capsule (75 mg total) by mouth 2 (two) times daily., Disp: 60 capsule, Rfl: 2 .  PRESCRIPTION MEDICATION, Inject 1 Dose into the muscle once., Disp: , Rfl:  .  Probiotic Product (PROBIOTIC ADVANCED PO), Take by mouth., Disp: , Rfl:  .  sertraline (ZOLOFT) 100 MG tablet, Take 1 tablet (100 mg total) by mouth daily., Disp: 90 tablet, Rfl: 3 .  valACYclovir (VALTREX) 500 MG tablet, Take 500 mg by mouth as needed for lip care., Disp: , Rfl: 4  No Known Allergies   ROS See  HPI  Objective  Vitals:   01/18/18 1048  BP: 116/78  Pulse: 98  Resp: 16  Temp: 98.6 F (37 C)  TempSrc: Oral  SpO2: 96%  Weight: 146 lb (66.2 kg)  Height: 5\' 7"  (1.702 m)    Body mass index is 22.87 kg/m.  Physical Exam Vital signs reviewed. Constitutional: Patient appears well-developed and well-nourished. No distress.  HENT: Head: Normocephalic and atraumatic. Nose: Nose normal. Mouth/Throat: Oropharynx is clear and moist. Eyes: Conjunctivae and EOM are normal. Pupils are equal, round, and reactive to light. No scleral icterus. Neck: Normal range of motion. Neck supple.  Cardiovascular: Normal rate, regular rhythm and normal heart sounds.  No murmur heard. No BLE edema. Distal pulses intact. Pulmonary/Chest:  Effort normal and breath sounds normal. No respiratory distress. Musculoskeletal: Normal range of motion, No gross deformities Neurological: She is alert and oriented to person, place, and time.  Coordination, balance, strength, speech and gait are normal.  Skin: Skin is warm and dry. No rash noted. No erythema.  Psychiatric: Patient has a normal mood and affect. behavior is normal. Judgment and thought content normal.    PHQ2/9: Depression screen [facility] 2/9 12/20/2017 09/02/2017 08/18/2016 08/20/2015  Decreased Interest 2 0 0 0  Down, Depressed, Hopeless 1 0 0 0  PHQ - 2 Score 3 0 0 0  Altered sleeping  2 0 - -  Tired, decreased energy 2 0 - -  Change in appetite 2 0 - -  Feeling bad or failure about yourself  3 0 - -  Trouble concentrating 1 0 - -  Moving slowly or fidgety/restless 1 0 - -  Suicidal thoughts 0 0 - -  PHQ-9 Score 14 0 - -    Fall Risk: Fall Risk  06/29/2017 08/18/2016  Falls in the past year? No No    Assessment & Plan RTC in about 1 month for F/u of anxiety and depression- zoloft increase; CPE if stable

## 2018-01-18 NOTE — Assessment & Plan Note (Signed)
Discussed long term risks of benzodiazepine use including addiction and memory loss. I recommended that safer option would be to increase zoloft dosage for worsening symptoms of anxiety and she is agreeable. We also discussed scheduling counseling to help with her anxiety and the use of nonpharm techniques to manage anxiety and depression -See AVS for addition information provided to patient RTC in 1 month for F/U - sertraline (ZOLOFT) 100 MG tablet; Take 1.5 tablets (150 mg total) by mouth daily.  Dispense: 45 tablet; Refill: 1

## 2018-01-20 MED FILL — LYRICA 75 MG CAPSULE: 75 | 30 days supply | Qty: 60 | Fill #1

## 2018-02-03 ENCOUNTER — Encounter: Payer: Self-pay | Admitting: Nurse Practitioner

## 2018-02-05 ENCOUNTER — Ambulatory Visit (INDEPENDENT_AMBULATORY_CARE_PROVIDER_SITE_OTHER): Payer: Self-pay | Admitting: Family

## 2018-02-05 ENCOUNTER — Encounter: Payer: Self-pay | Admitting: Family

## 2018-02-05 VITALS — BP 110/70 | HR 97 | Temp 98.8°F | Wt 140.0 lb

## 2018-02-05 DIAGNOSIS — J3489 Other specified disorders of nose and nasal sinuses: Secondary | ICD-10-CM

## 2018-02-05 DIAGNOSIS — J040 Acute laryngitis: Secondary | ICD-10-CM

## 2018-02-05 DIAGNOSIS — J069 Acute upper respiratory infection, unspecified: Secondary | ICD-10-CM

## 2018-02-05 MED ORDER — PROMETHAZINE-PHENYLEPHRINE 6.25-5 MG/5ML PO SYRP: 5.0000 mL | ORAL_SOLUTION | Freq: Four times a day (QID) | ORAL | 0 refills | Status: DC | PRN

## 2018-02-05 NOTE — Patient Instructions (Signed)
Upper Respiratory Infection, Adult Most upper respiratory infections (URIs) are a viral infection of the air passages leading to the lungs. A URI affects the nose, throat, and upper air passages. The most common type of URI is nasopharyngitis and is typically referred to as "the common cold." URIs run their course and usually go away on their own. Most of the time, a URI does not require medical attention, but sometimes a bacterial infection in the upper airways can follow a viral infection. This is called a secondary infection. Sinus and middle ear infections are common types of secondary upper respiratory infections. Bacterial pneumonia can also complicate a URI. A URI can worsen asthma and chronic obstructive pulmonary disease (COPD). Sometimes, these complications can require emergency medical care and may be life threatening. What are the causes? Almost all URIs are caused by viruses. A virus is a type of germ and can spread from one person to another. What increases the risk? You may be at risk for a URI if:  You smoke.  You have chronic heart or lung disease.  You have a weakened defense (immune) system.  You are very young or very old.  You have nasal allergies or asthma.  You work in crowded or poorly ventilated areas.  You work in health care facilities or schools.  What are the signs or symptoms? Symptoms typically develop 2-3 days after you come in contact with a cold virus. Most viral URIs last 7-10 days. However, viral URIs from the influenza virus (flu virus) can last 14-18 days and are typically more severe. Symptoms may include:  Runny or stuffy (congested) nose.  Sneezing.  Cough.  Sore throat.  Headache.  Fatigue.  Fever.  Loss of appetite.  Pain in your forehead, behind your eyes, and over your cheekbones (sinus pain).  Muscle aches.  How is this diagnosed? Your health care provider may diagnose a URI by:  Physical exam.  Tests to check that your  symptoms are not due to another condition such as: ? Strep throat. ? Sinusitis. ? Pneumonia. ? Asthma.  How is this treated? A URI goes away on its own with time. It cannot be cured with medicines, but medicines may be prescribed or recommended to relieve symptoms. Medicines may help:  Reduce your fever.  Reduce your cough.  Relieve nasal congestion.  Follow these instructions at home:  Take medicines only as directed by your health care provider.  Gargle warm saltwater or take cough drops to comfort your throat as directed by your health care provider.  Use a warm mist humidifier or inhale steam from a shower to increase air moisture. This may make it easier to breathe.  Drink enough fluid to keep your urine clear or pale yellow.  Eat soups and other clear broths and maintain good nutrition.  Rest as needed.  Return to work when your temperature has returned to normal or as your health care provider advises. You may need to stay home longer to avoid infecting others. You can also use a face mask and careful hand washing to prevent spread of the virus.  Increase the usage of your inhaler if you have asthma.  Do not use any tobacco products, including cigarettes, chewing tobacco, or electronic cigarettes. If you need help quitting, ask your health care provider. How is this prevented? The best way to protect yourself from getting a cold is to practice good hygiene.  Avoid oral or hand contact with people with cold symptoms.  Wash your   hands often if contact occurs.  There is no clear evidence that vitamin C, vitamin E, echinacea, or exercise reduces the chance of developing a cold. However, it is always recommended to get plenty of rest, exercise, and practice good nutrition. Contact a health care provider if:  You are getting worse rather than better.  Your symptoms are not controlled by medicine.  You have chills.  You have worsening shortness of breath.  You have  brown or red mucus.  You have yellow or brown nasal discharge.  You have pain in your face, especially when you bend forward.  You have a fever.  You have swollen neck glands.  You have pain while swallowing.  You have white areas in the back of your throat. Get help right away if:  You have severe or persistent: ? Headache. ? Ear pain. ? Sinus pain. ? Chest pain.  You have chronic lung disease and any of the following: ? Wheezing. ? Prolonged cough. ? Coughing up blood. ? A change in your usual mucus.  You have a stiff neck.  You have changes in your: ? Vision. ? Hearing. ? Thinking. ? Mood. This information is not intended to replace advice given to you by your health care provider. Make sure you discuss any questions you have with your health care provider. Document Released: 04/13/2001 Document Revised: 06/20/2016 Document Reviewed: 01/23/2014 Elsevier Interactive Patient Education  2018 [employer]  

## 2018-02-05 NOTE — Progress Notes (Signed)
Subjective:     Patient ID: Angela [name], female   DOB: [birth year], 37 y.o.   MRN: [id number]  HPI 37 year old female is in today with c/o cough, congestion, sinus pressure, clear phlegm that began yesterday. She is accompanied by her husband who has similar symptoms. No fever or chills. Patient did an e-visit yesterday but had not picked up the Prednisone that was prescribed.   Review of Systems  Constitutional: Negative for chills and fever.  HENT: Positive for congestion, postnasal drip, rhinorrhea and sinus pressure.   Respiratory: Negative.   Cardiovascular: Negative.   Musculoskeletal: Negative.   Allergic/Immunologic: Negative.   Neurological: Negative.   Psychiatric/Behavioral: Negative.    Past Medical History:  Diagnosis Date  . Abnormal Pap smear    repeat ok  . Fibromyalgia 09/02/2017  . Herpes zoster   . Panic attacks   . PCOS (polycystic ovarian syndrome)    conceived on metformin and clomid  . PP SVD 12/02/11 M 12/03/2011    Social History   Socioeconomic History  . Marital status: Married    Spouse name: Not on file  . Number of children: 1  . Years of education: 8  . Highest education level: Not on file  Occupational History  . Occupation: Systems developer  Social Needs  . Financial resource strain: Not on file  . Food insecurity:    Worry: Not on file    Inability: Not on file  . Transportation needs:    Medical: Not on file    Non-medical: Not on file  Tobacco Use  . Smoking status: Former Smoker    Last attempt to quit: 11/01/2009    Years since quitting: 8.2  . Smokeless tobacco: Never Used  Substance and Sexual Activity  . Alcohol use: Yes    Alcohol/week: 1.2 oz    Types: 2 Cans of beer per week  . Drug use: No  . Sexual activity: Yes  Lifestyle  . Physical activity:    Days per week: Not on file    Minutes per session: Not on file  . Stress: Not on file  Relationships  . Social connections:    Talks on phone: Not on file   Gets together: Not on file    Attends religious service: Not on file    Active member of club or organization: Not on file    Attends meetings of clubs or organizations: Not on file    Relationship status: Not on file  . Intimate partner violence:    Fear of current or ex partner: Not on file    Emotionally abused: Not on file    Physically abused: Not on file    Forced sexual activity: Not on file  Other Topics Concern  . 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    Denies any religious beliefs effecting health care.    Lives with husband and son in a 2 story home.     Education: college.     Past Surgical History:  Procedure Laterality Date  . BREAST BIOPSY    . WISDOM TOOTH EXTRACTION      Family History  Problem Relation Age of Onset  . Diabetes Father        uncontrolled  . Hypertension Father   . Hyperlipidemia Father   . Alcohol abuse Other   . Arthritis Other   . Ovarian cancer Paternal Grandmother   .  Anesthesia problems Neg Hx     No Known Allergies  Current Outpatient Medications on File Prior to Visit  Medication Sig Dispense Refill  . cyclobenzaprine (FLEXERIL) 10 MG tablet Take 0.5-1 tablets (5-10 mg total) by mouth 3 (three) times daily as needed for muscle spasms. 60 tablet 0  . Diclofenac Sodium (PENNSAID) 2 % SOLN Place 1 application onto the skin 2 (two) times daily. 1 Bottle 3  . Ibuprofen-Famotidine 800-26.6 MG TABS Take 1 tablet by mouth 3 (three) times daily as needed. 90 tablet 5  . Multiple Vitamin (MULTIVITAMIN WITH MINERALS) TABS tablet Take 1 tablet by mouth daily.    . pregabalin (LYRICA) 75 MG capsule Take 1 capsule (75 mg total) by mouth 2 (two) times daily. 60 capsule 2  . Probiotic Product (PROBIOTIC ADVANCED PO) Take by mouth.    . sertraline (ZOLOFT) 100 MG tablet Take 1.5 tablets (150 mg total) by mouth daily. 45 tablet 1  . valACYclovir (VALTREX) 500 MG tablet Take 500 mg by  mouth as needed for lip care.  4  . predniSONE (DELTASONE) 20 MG tablet     . PRESCRIPTION MEDICATION Inject 1 Dose into the muscle once.     No current facility-administered medications on file prior to visit.     BP 110/70   Pulse 97   Temp 98.8 F (37.1 C)   Wt 140 lb (63.5 kg)   SpO2 100%   BMI 21.93 kg/m chart     Objective:   Physical Exam  Constitutional: She is oriented to person, place, and time. She appears well-developed and well-nourished.  HENT:  Right Ear: External ear normal.  Left Ear: External ear normal.  Nose: Nose normal.  Mouth/Throat: Posterior oropharyngeal erythema present. No oropharyngeal exudate or posterior oropharyngeal edema.  Neck: Normal range of motion. Neck supple.  Cardiovascular: Normal rate, regular rhythm and normal heart sounds.  Pulmonary/Chest: Effort normal and breath sounds normal. She has no wheezes.  Musculoskeletal: Normal range of motion.  Neurological: She is alert and oriented to person, place, and time.  Skin: Skin is warm and dry.  Psychiatric: She has a normal mood and affect.       Assessment:     Angela [name] was seen today for focus-cough/hoarsenss.  Diagnoses and all orders for this visit:  Viral upper respiratory tract infection  Laryngitis  Sinus pressure  Other orders -     promethazine-phenylephrine (PROMETHAZINE-PHENYLEPHRINE) 6.25-5 MG/5ML SYRP; Take 5 mLs by mouth every 6 (six) hours as needed for congestion.      Plan:     Pick up prednisone. Call the office with any questions or concern.

## 2018-02-16 ENCOUNTER — Telehealth: Payer: Self-pay

## 2018-02-20 MED FILL — LYRICA 75 MG CAPSULE: 75 | 30 days supply | Qty: 60 | Fill #2

## 2018-02-21 ENCOUNTER — Encounter: Payer: Self-pay | Admitting: Family Medicine

## 2018-02-21 ENCOUNTER — Ambulatory Visit (INDEPENDENT_AMBULATORY_CARE_PROVIDER_SITE_OTHER): Payer: No Typology Code available for payment source | Admitting: Family Medicine

## 2018-02-21 VITALS — BP 108/62 | HR 100 | Temp 98.6°F | Ht 67.0 in | Wt 141.0 lb

## 2018-02-21 DIAGNOSIS — M7061 Trochanteric bursitis, right hip: Secondary | ICD-10-CM | POA: Diagnosis not present

## 2018-02-21 DIAGNOSIS — M7918 Myalgia, other site: Secondary | ICD-10-CM

## 2018-02-21 DIAGNOSIS — G5700 Lesion of sciatic nerve, unspecified lower limb: Secondary | ICD-10-CM | POA: Insufficient documentation

## 2018-02-21 MED ORDER — DICLOFENAC SODIUM 2 % TD SOLN: 1.0000 "application " | Freq: Two times a day (BID) | TRANSDERMAL | 3 refills | Status: DC

## 2018-02-21 NOTE — Progress Notes (Signed)
Angela [name] - 37 y.o. female MRN [id number]  Date of birth: [birth year]  SUBJECTIVE:  Including CC & ROS.  Chief Complaint  Patient presents with  . Follow-up    Angela [name] is a 37 y.o. female that is here today for right hip pain follow up. She was seen 12/22/17 for same problem. Pain has been increasing over the past month. Pain located in her greater trochanteric and extends posteriorly. Pain is described as a constant ache. She has been taking Duexis with no improvement.  She has been completing daily exercises provided at the last visit with no improvement.    Review of Systems  Constitutional: Negative for fever.  HENT: Negative for congestion.   Cardiovascular: Negative for chest pain.  Gastrointestinal: Negative for abdominal pain.  Musculoskeletal: Negative for gait problem.  Skin: Negative for color change.  Allergic/Immunologic: Negative for immunocompromised state.  Neurological: Negative for weakness.  Hematological: Negative for adenopathy.  Psychiatric/Behavioral: Negative for agitation.    HISTORY: Past Medical, Surgical, Social, and Family History Reviewed & Updated per EMR.   Pertinent Historical Findings include:  Past Medical History:  Diagnosis Date  . Abnormal Pap smear    repeat ok  . Fibromyalgia 09/02/2017  . Herpes zoster   . Panic attacks   . PCOS (polycystic ovarian syndrome)    conceived on metformin and clomid  . PP SVD 12/02/11 M 12/03/2011    Past Surgical History:  Procedure Laterality Date  . BREAST BIOPSY    . WISDOM TOOTH EXTRACTION      No Known Allergies  Family History  Problem Relation Age of Onset  . Diabetes Father        uncontrolled  . Hypertension Father   . Hyperlipidemia Father   . Alcohol abuse Other   . Arthritis Other   . Ovarian cancer Paternal Grandmother   . Anesthesia problems Neg Hx      Social History   Socioeconomic History  . Marital status: Married    Spouse name: Not on file  . Number of  children: 1  . Years of education: 66  . Highest education level: Not on file  Occupational History  . Occupation: Systems developer  Social Needs  . Financial resource strain: Not on file  . Food insecurity:    Worry: Not on file    Inability: Not on file  . Transportation needs:    Medical: Not on file    Non-medical: Not on file  Tobacco Use  . Smoking status: Former Smoker    Last attempt to quit: 11/01/2009    Years since quitting: 8.3  . Smokeless tobacco: Never Used  Substance and Sexual Activity  . Alcohol use: Yes    Alcohol/week: 1.2 oz    Types: 2 Cans of beer per week  . Drug use: No  . Sexual activity: Yes  Lifestyle  . Physical activity:    Days per week: Not on file    Minutes per session: Not on file  . Stress: Not on file  Relationships  . Social connections:    Talks on phone: Not on file    Gets together: Not on file    Attends religious service: Not on file    Active member of club or organization: Not on file    Attends meetings of clubs or organizations: Not on file    Relationship status: Not on file  . Intimate partner violence:    Fear of current or ex partner:  Not on file    Emotionally abused: Not on file    Physically abused: Not on file    Forced sexual activity: Not on file  Other Topics Concern  . 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    Denies any religious beliefs effecting health care.    Lives with husband and son in a 2 story home.     Education: college.      PHYSICAL EXAM:  VS: BP 108/62 (BP Location: Left Arm, Patient Position: Sitting, Cuff Size: Normal)   Pulse 100   Temp 98.6 F (37 C) (Oral)   Ht 5\' 7"  (1.702 m)   Wt 141 lb (64 kg)   SpO2 93%   BMI 22.08 kg/m  Physical Exam Gen: NAD, alert, cooperative with exam, well-appearing ENT: normal lips, normal nasal mucosa,  Eye: normal EOM, normal conjunctiva and lids CV:  no edema, +2 pedal pulses     Resp: no accessory muscle use, non-labored,  GI: no masses or tenderness, no hernia  Skin: no rashes, no areas of induration  Neuro: normal tone, normal sensation to touch Psych:  normal insight, alert and oriented MSK:  Right hip:  Excessive ER  Normal strength to resistance with hip flexion  Normal FADIR and FABER  Normal knee flexion and extension  Normal gait  Normal SLR b/l  Neurovasculary intact     Aspiration/Injection Procedure Note [name] 08/01/1981  Procedure: Injection Indications: right hip pain   Procedure Details Consent: Risks of procedure as well as the alternatives and risks of each were explained to the (patient/caregiver).  Consent for procedure obtained. Time Out: Verified patient identification, verified procedure, site/side was marked, verified correct patient position, special equipment/implants available, medications/allergies/relevent history reviewed, required imaging and test results available.  Performed.  The area was cleaned with iodine and alcohol swabs.    The right GT bursa was injected using 1 cc's of 40 mg Depomedrol and 4 cc's of 1% lidocaine with a 22 3" needle.  Ultrasound was used. Images were obtained in Long views showing the injection.    A sterile dressing was applied.  Patient did tolerate procedure well.         ASSESSMENT & PLAN:   Greater trochanteric bursitis of right hip Having pain in the GT and piriformis. 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

## 2018-02-21 NOTE — Assessment & Plan Note (Signed)
Having pain in the GT and piriformis. 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

## 2018-02-21 NOTE — Patient Instructions (Addendum)
Please continue the exercises  Please follow up with me in 4-6 weeks if your pain hasn't improved.

## 2018-02-28 ENCOUNTER — Ambulatory Visit (INDEPENDENT_AMBULATORY_CARE_PROVIDER_SITE_OTHER): Payer: No Typology Code available for payment source | Admitting: Nurse Practitioner

## 2018-02-28 ENCOUNTER — Other Ambulatory Visit (INDEPENDENT_AMBULATORY_CARE_PROVIDER_SITE_OTHER): Payer: No Typology Code available for payment source

## 2018-02-28 ENCOUNTER — Encounter: Payer: Self-pay | Admitting: Nurse Practitioner

## 2018-02-28 VITALS — BP 120/72 | HR 86 | Temp 98.6°F | Resp 16 | Ht 67.0 in | Wt 138.0 lb

## 2018-02-28 DIAGNOSIS — R5383 Other fatigue: Secondary | ICD-10-CM | POA: Diagnosis not present

## 2018-02-28 DIAGNOSIS — Z13220 Encounter for screening for lipoid disorders: Secondary | ICD-10-CM

## 2018-02-28 DIAGNOSIS — F329 Major depressive disorder, single episode, unspecified: Secondary | ICD-10-CM

## 2018-02-28 DIAGNOSIS — F419 Anxiety disorder, unspecified: Secondary | ICD-10-CM

## 2018-02-28 DIAGNOSIS — Z0001 Encounter for general adult medical examination with abnormal findings: Secondary | ICD-10-CM

## 2018-02-28 DIAGNOSIS — M357 Hypermobility syndrome: Secondary | ICD-10-CM

## 2018-02-28 DIAGNOSIS — F32A Depression, unspecified: Secondary | ICD-10-CM

## 2018-02-28 DIAGNOSIS — M797 Fibromyalgia: Secondary | ICD-10-CM | POA: Diagnosis not present

## 2018-02-28 DIAGNOSIS — Z0000 Encounter for general adult medical examination without abnormal findings: Secondary | ICD-10-CM

## 2018-02-28 LAB — COMPREHENSIVE METABOLIC PANEL
ALT: 12 U/L (ref 0–35)
AST: 14 U/L (ref 0–37)
Albumin: 4.6 g/dL (ref 3.5–5.2)
Alkaline Phosphatase: 54 U/L (ref 39–117)
BILIRUBIN TOTAL: 0.7 mg/dL (ref 0.2–1.2)
BUN: 11 mg/dL (ref 6–23)
CO2: 26 mEq/L (ref 19–32)
CREATININE: 0.68 mg/dL (ref 0.40–1.20)
Calcium: 9.2 mg/dL (ref 8.4–10.5)
Chloride: 102 mEq/L (ref 96–112)
GFR: 103.93 mL/min (ref >60.00)
GLUCOSE: 85 mg/dL (ref 70–99)
Potassium: 3.7 mEq/L (ref 3.5–5.1)
SODIUM: 137 meq/L (ref 135–145)
Total Protein: 7.5 g/dL (ref 6.0–8.3)

## 2018-02-28 LAB — LIPID PANEL
CHOL/HDL RATIO: 3
Cholesterol: 204 mg/dL — ABNORMAL HIGH (ref 0–200)
HDL: 65.2 mg/dL (ref >39.00)
LDL Cholesterol: 124 mg/dL — ABNORMAL HIGH (ref 0–99)
NONHDL: 139.04
Triglycerides: 76 mg/dL (ref 0.0–149.0)
VLDL: 15.2 mg/dL (ref 0.0–40.0)

## 2018-02-28 LAB — TSH: TSH: 2.66 u[IU]/mL (ref 0.35–4.50)

## 2018-02-28 LAB — CBC WITH DIFFERENTIAL/PLATELET
BASOS ABS: 0.1 10*3/uL (ref 0.0–0.1)
Basophils Relative: 0.8 % (ref 0.0–3.0)
EOS ABS: 0.1 10*3/uL (ref 0.0–0.7)
Eosinophils Relative: 0.8 % (ref 0.0–5.0)
HCT: 38.3 % (ref 36.0–46.0)
Hemoglobin: 12.9 g/dL (ref 12.0–15.0)
LYMPHS ABS: 2.1 10*3/uL (ref 0.7–4.0)
Lymphocytes Relative: 30.8 % (ref 12.0–46.0)
MCHC: 33.7 g/dL (ref 30.0–36.0)
MCV: 88.2 fl (ref 78.0–100.0)
MONOS PCT: 5.8 % (ref 3.0–12.0)
Monocytes Absolute: 0.4 10*3/uL (ref 0.1–1.0)
NEUTROS PCT: 61.8 % (ref 43.0–77.0)
Neutro Abs: 4.3 10*3/uL (ref 1.4–7.7)
Platelets: 331 10*3/uL (ref 150.0–400.0)
RBC: 4.35 Mil/uL (ref 3.87–5.11)
RDW: 14 % (ref 11.5–15.5)
WBC: 7 10*3/uL (ref 4.0–10.5)

## 2018-02-28 LAB — VITAMIN D 25 HYDROXY (VIT D DEFICIENCY, FRACTURES): VITD: 38.02 ng/mL (ref 30.00–100.00)

## 2018-02-28 NOTE — Assessment & Plan Note (Signed)
-   Ambulatory referral to Physical Therapy

## 2018-02-28 NOTE — Patient Instructions (Signed)
Please head downstairs for lab work/x-rays. If any of your test results are critically abnormal, you will be contacted right away. Your results may be released to your MyChart for viewing before I am able to provide you with my response. I will contact you within a week about your test results and any recommendations for abnormalities.  I have placed a referral to PT for hypermobility. Our office will call you to schedule this appointment. You should hear from our office in 7-10 days.  Please return in about 6 months for follow up.  Health Maintenance, Female Adopting a healthy lifestyle and getting preventive care can go a long way to promote health and wellness. Talk with your health care provider about what schedule of regular examinations is right for you. This is a good chance for you to check in with your provider about disease prevention and staying healthy. In between checkups, there are plenty of things you can do on your own. Experts have done a lot of research about which lifestyle changes and preventive measures are most likely to keep you healthy. Ask your health care provider for more information. Weight and diet Eat a healthy diet  Be sure to include plenty of vegetables, fruits, low-fat dairy products, and lean protein.  Do not eat a lot of foods high in solid fats, added sugars, or salt.  Get regular exercise. This is one of the most important things you can do for your health. ? Most adults should exercise for at least 150 minutes each week. The exercise should increase your heart rate and make you sweat (moderate-intensity exercise). ? Most adults should also do strengthening exercises at least twice a week. This is in addition to the moderate-intensity exercise.  Maintain a healthy weight  Body mass index (BMI) is a measurement that can be used to identify possible weight problems. It estimates body fat based on height and weight. Your health care provider can help  determine your BMI and help you achieve or maintain a healthy weight.  For females 21 years of age and older: ? A BMI below 18.5 is considered underweight. ? A BMI of 18.5 to 24.9 is normal. ? A BMI of 25 to 29.9 is considered overweight. ? A BMI of 30 and above is considered obese.  Watch levels of cholesterol and blood lipids  You should start having your blood tested for lipids and cholesterol at 36 years of age, then have this test every 5 years.  You may need to have your cholesterol levels checked more often if: ? Your lipid or cholesterol levels are high. ? You are older than 36 years of age. ? You are at high risk for heart disease.  Cancer screening Lung Cancer  Lung cancer screening is recommended for adults 62-35 years old who are at high risk for lung cancer because of a history of smoking.  A yearly low-dose CT scan of the lungs is recommended for people who: ? Currently smoke. ? Have quit within the past 15 years. ? Have at least a 30-pack-year history of smoking. A pack year is smoking an average of one pack of cigarettes a day for 1 year.  Yearly screening should continue until it has been 15 years since you quit.  Yearly screening should stop if you develop a health problem that would prevent you from having lung cancer treatment.  Breast Cancer  Practice breast self-awareness. This means understanding how your breasts normally appear and feel.  It also means  doing regular breast self-exams. Let your health care provider know about any changes, no matter how small.  If you are in your 20s or 30s, you should have a clinical breast exam (CBE) by a health care provider every 1-3 years as part of a regular health exam.  If you are 89 or older, have a CBE every year. Also consider having a breast X-ray (mammogram) every year.  If you have a family history of breast cancer, talk to your health care provider about genetic screening.  If you are at high risk for  breast cancer, talk to your health care provider about having an MRI and a mammogram every year.  Breast cancer gene (BRCA) assessment is recommended for women who have family members with BRCA-related cancers. BRCA-related cancers include: ? Breast. ? Ovarian. ? Tubal. ? Peritoneal cancers.  Results of the assessment will determine the need for genetic counseling and BRCA1 and BRCA2 testing.  Cervical Cancer Your health care provider may recommend that you be screened regularly for cancer of the pelvic organs (ovaries, uterus, and vagina). This screening involves a pelvic examination, including checking for microscopic changes to the surface of your cervix (Pap test). You may be encouraged to have this screening done every 3 years, beginning at age 56.  For women ages 70-65, health care providers may recommend pelvic exams and Pap testing every 3 years, or they may recommend the Pap and pelvic exam, combined with testing for human papilloma virus (HPV), every 5 years. Some types of HPV increase your risk of cervical cancer. Testing for HPV may also be done on women of any age with unclear Pap test results.  Other health care providers may not recommend any screening for nonpregnant women who are considered low risk for pelvic cancer and who do not have symptoms. Ask your health care provider if a screening pelvic exam is right for you.  If you have had past treatment for cervical cancer or a condition that could lead to cancer, you need Pap tests and screening for cancer for at least 20 years after your treatment. If Pap tests have been discontinued, your risk factors (such as having a new sexual partner) need to be reassessed to determine if screening should resume. Some women have medical problems that increase the chance of getting cervical cancer. In these cases, your health care provider may recommend more frequent screening and Pap tests.  Colorectal Cancer  This type of cancer can be  detected and often prevented.  Routine colorectal cancer screening usually begins at 36 years of age and continues through 36 years of age.  Your health care provider may recommend screening at an earlier age if you have risk factors for colon cancer.  Your health care provider may also recommend using home test kits to check for hidden blood in the stool.  A small camera at the end of a tube can be used to examine your colon directly (sigmoidoscopy or colonoscopy). This is done to check for the earliest forms of colorectal cancer.  Routine screening usually begins at age 54.  Direct examination of the colon should be repeated every 5-10 years through 36 years of age. However, you may need to be screened more often if early forms of precancerous polyps or small growths are found.  Skin Cancer  Check your skin from head to toe regularly.  Tell your health care provider about any new moles or changes in moles, especially if there is a change in a  mole's shape or color.  Also tell your health care provider if you have a mole that is larger than the size of a pencil eraser.  Always use sunscreen. Apply sunscreen liberally and repeatedly throughout the day.  Protect yourself by wearing long sleeves, pants, a wide-brimmed hat, and sunglasses whenever you are outside.  Heart disease, diabetes, and high blood pressure  High blood pressure causes heart disease and increases the risk of stroke. High blood pressure is more likely to develop in: ? People who have blood pressure in the high end of the normal range (130-139/85-89 mm Hg). ? People who are overweight or obese. ? People who are African American.  If you are 21-32 years of age, have your blood pressure checked every 3-5 years. If you are 30 years of age or older, have your blood pressure checked every year. You should have your blood pressure measured twice-once when you are at a hospital or clinic, and once when you are not at a  hospital or clinic. Record the average of the two measurements. To check your blood pressure when you are not at a hospital or clinic, you can use: ? An automated blood pressure machine at a pharmacy. ? A home blood pressure monitor.  If you are between 61 years and 42 years old, ask your health care provider if you should take aspirin to prevent strokes.  Have regular diabetes screenings. This involves taking a blood sample to check your fasting blood sugar level. ? If you are at a normal weight and have a low risk for diabetes, have this test once every three years after 36 years of age. ? If you are overweight and have a high risk for diabetes, consider being tested at a younger age or more often. Preventing infection Hepatitis B  If you have a higher risk for hepatitis B, you should be screened for this virus. You are considered at high risk for hepatitis B if: ? You were born in a country where hepatitis B is common. Ask your health care provider which countries are considered high risk. ? Your parents were born in a high-risk country, and you have not been immunized against hepatitis B (hepatitis B vaccine). ? You have HIV or AIDS. ? You use needles to inject street drugs. ? You live with someone who has hepatitis B. ? You have had sex with someone who has hepatitis B. ? You get hemodialysis treatment. ? You take certain medicines for conditions, including cancer, organ transplantation, and autoimmune conditions.  Hepatitis C  Blood testing is recommended for: ? Everyone born from [birth year] through 1964. ? Anyone with known risk factors for hepatitis C.  Sexually transmitted infections (STIs)  You should be screened for sexually transmitted infections (STIs) including gonorrhea and chlamydia if: ? You are sexually active and are younger than 36 years of age. ? You are older than 36 years of age and your health care provider tells you that you are at risk for this type of  infection. ? Your sexual activity has changed since you were last screened and you are at an increased risk for chlamydia or gonorrhea. Ask your health care provider if you are at risk.  If you do not have HIV, but are at risk, it may be recommended that you take a prescription medicine daily to prevent HIV infection. This is called pre-exposure prophylaxis (PrEP). You are considered at risk if: ? You are sexually active and do not regularly use condoms or  know the HIV status of your partner(s). ? You take drugs by injection. ? You are sexually active with a partner who has HIV.  Talk with your health care provider about whether you are at high risk of being infected with HIV. If you choose to begin PrEP, you should first be tested for HIV. You should then be tested every 3 months for as long as you are taking PrEP. Pregnancy  If you are premenopausal and you may become pregnant, ask your health care provider about preconception counseling.  If you may become pregnant, take 400 to 800 micrograms (mcg) of folic acid every day.  If you want to prevent pregnancy, talk to your health care provider about birth control (contraception). Osteoporosis and menopause  Osteoporosis is a disease in which the bones lose minerals and strength with aging. This can result in serious bone fractures. Your risk for osteoporosis can be identified using a bone density scan.  If you are 55 years of age or older, or if you are at risk for osteoporosis and fractures, ask your health care provider if you should be screened.  Ask your health care provider whether you should take a calcium or vitamin D supplement to lower your risk for osteoporosis.  Menopause may have certain physical symptoms and risks.  Hormone replacement therapy may reduce some of these symptoms and risks. Talk to your health care provider about whether hormone replacement therapy is right for you. Follow these instructions at home:  Schedule  regular health, dental, and eye exams.  Stay current with your immunizations.  Do not use any tobacco products including cigarettes, chewing tobacco, or electronic cigarettes.  If you are pregnant, do not drink alcohol.  If you are breastfeeding, limit how much and how often you drink alcohol.  Limit alcohol intake to no more than 1 drink per day for nonpregnant women. One drink equals 12 ounces of beer, 5 ounces of wine, or 1 ounces of hard liquor.  Do not use street drugs.  Do not share needles.  Ask your health care provider for help if you need support or information about quitting drugs.  Tell your health care provider if you often feel depressed.  Tell your health care provider if you have ever been abused or do not feel safe at home. This information is not intended to replace advice given to you by your health care provider. Make sure you discuss any questions you have with your health care provider. Document Released: 05/03/2011 Document Revised: 03/25/2016 Document Reviewed: 07/22/2015 Elsevier Interactive Patient Education  [employer].

## 2018-02-28 NOTE — Assessment & Plan Note (Signed)
-  USPSTF grade A and B recommendations reviewed with patient; age-appropriate recommendations, preventive care, screening tests, etc discussed and encouraged; healthy living including sunscreen use encouraged; see AVS for patient education given to patient. Declines advanced directives packet -Discussed importance of 150 minutes of physical activity weekly,  every other day, eat 6 servings of fruit/vegetables daily and drink plenty of water and avoid sweet beverages.  -Follow up and care instructions discussed and provided in AVS.  -Reviewed Health Maintenance: up to date   Screening for cholesterol level- Lipid panel; Future

## 2018-02-28 NOTE — Assessment & Plan Note (Signed)
Stable Continue zoloft at current dosage F/U for new, worsening symptoms - TSH; Future - VITAMIN D 25 Hydroxy (Vit-D Deficiency, Fractures); Future

## 2018-02-28 NOTE — Progress Notes (Signed)
Name: Angela [name]   MRN: [id number]    DOB: Mar 13, 1981   Date:02/28/2018       Progress Note  Subjective  Chief Complaint  Chief Complaint  Patient presents with  . CPE    fasting, lyrica refill    HPI  Patient presents for annual CPE.  Diet: tries to follow a healthy diet, water and coffee, vegetables daily Exercise: exercises around house, exercise band, has been more active and following exercises given to her by sports med provider  USPSTF grade A and B recommendations  Depression: increased zoloft to 150 mg at last OV due to increased anxiety and stress related to chronic illness She has noticed great improvement in her anxiety at new zoloft dosage-no longer having constant feeling that something bad is going to happen or feelings of panic that she was having before Denies depression, thoughts of harming self or others Depression screen [facility] 2/9 12/20/2017 09/02/2017 08/18/2016 08/20/2015  Decreased Interest 2 0 0 0  Down, Depressed, Hopeless 1 0 0 0  PHQ - 2 Score 3 0 0 0  Altered sleeping 2 0 - -  Tired, decreased energy 2 0 - -  Change in appetite 2 0 - -  Feeling bad or failure about yourself  3 0 - -  Trouble concentrating 1 0 - -  Moving slowly or fidgety/restless 1 0 - -  Suicidal thoughts 0 0 - -  PHQ-9 Score 14 0 - -   Hypertension: BP Readings from Last 3 Encounters:  02/28/18 120/72  02/21/18 108/62  02/05/18 110/70   Obesity: Wt Readings from Last 3 Encounters:  02/28/18 138 lb (62.6 kg)  02/21/18 141 lb (64 kg)  02/05/18 140 lb (63.5 kg)   BMI Readings from Last 3 Encounters:  02/28/18 21.61 kg/m  02/21/18 22.08 kg/m  02/05/18 21.93 kg/m    Alcohol: no Tobacco use: no, never HIV screening: done STD testing and prevention (chl/gon/syphilis): no concerns, declines Intimate partner violence:feels safe Incontinence Symptoms: denies  Vaccinations: up to date  Advanced Care Planning: A voluntary discussion about advance care planning  including the explanation and discussion of advance directives.  Discussed health care proxy and Living will, and the patient DOES NOT have a living will at present time. If patient does have living will, I have requested they bring this to the clinic to be scanned in to their chart.   Cervical cancer screening: follows with GYN for routine womens health care, PAP up to date  Lipids:  Lab Results  Component Value Date   CHOL 217 (H) 08/18/2016   CHOL 197 02/11/2015   CHOL 181 06/12/2013   Lab Results  Component Value Date   HDL 63.30 08/18/2016   HDL 65.50 02/11/2015   HDL 61.70 06/12/2013   Lab Results  Component Value Date   LDLCALC 135 (H) 08/18/2016   LDLCALC 117 (H) 02/11/2015   LDLCALC 108 (H) 06/12/2013   Lab Results  Component Value Date   TRIG 91.0 08/18/2016   TRIG 72.0 02/11/2015   TRIG 58.0 06/12/2013   Lab Results  Component Value Date   CHOLHDL 3 08/18/2016   CHOLHDL 3 02/11/2015   CHOLHDL 3 06/12/2013   Lab Results  Component Value Date   LDLDIRECT 108.1 01/11/2011    Glucose:  Glucose, Bld  Date Value Ref Range Status  03/31/2017 103 (H) 70 - 99 mg/dL Final  91/47/8295 92 70 - 99 mg/dL Final  62/13/0865 83 70 - 99 mg/dL Final  Skin cancer: no concerning lesions or moles, wears sunscreen  Aspirin: not indicated ECG: not indcated   Patient Active Problem List   Diagnosis Date Noted  . Greater trochanteric bursitis of right hip 02/21/2018  . 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 Fibromyalgia 09/02/2017  . Chronic pain syndrome 07/06/2017  . Polyarthralgia 03/31/2017  . Fatigue 02/07/2017  . Routine general medical examination at a health care facility 02/11/2015  . PP SVD 12/02/11 35wks M 12/03/2011  . POLYCYSTIC OVARIAN DISEASE 01/11/2011  . Anxiety and depression 01/06/2010  . ALLERGIC RHINITIS 01/06/2010    Past Surgical History:  Procedure Laterality Date  . BREAST BIOPSY    . WISDOM TOOTH EXTRACTION      Family History   Problem Relation Age of Onset  . Diabetes Father        uncontrolled  . Hypertension Father   . Hyperlipidemia Father   . Alcohol abuse Other   . Arthritis Other   . Ovarian cancer Paternal Grandmother   . Anesthesia problems Neg Hx     Social History   Socioeconomic History  . Marital status: Married    Spouse name: Not on file  . Number of children: 1  . Years of education: 47  . Highest education level: Not on file  Occupational History  . Occupation: Systems developer  Social Needs  . Financial resource strain: Not on file  . Food insecurity:    Worry: Not on file    Inability: Not on file  . Transportation needs:    Medical: Not on file    Non-medical: Not on file  Tobacco Use  . Smoking status: Former Smoker    Last attempt to quit: 11/01/2009    Years since quitting: 8.3  . Smokeless tobacco: Never Used  Substance and Sexual Activity  . Alcohol use: Yes    Alcohol/week: 1.2 oz    Types: 2 Cans of beer per week  . Drug use: No  . Sexual activity: Yes  Lifestyle  . Physical activity:    Days per week: Not on file    Minutes per session: Not on file  . Stress: Not on file  Relationships  . Social connections:    Talks on phone: Not on file    Gets together: Not on file    Attends religious service: Not on file    Active member of club or organization: Not on file    Attends meetings of clubs or organizations: Not on file    Relationship status: Not on file  . Intimate partner violence:    Fear of current or ex partner: Not on file    Emotionally abused: Not on file    Physically abused: Not on file    Forced sexual activity: Not on file  Other Topics Concern  . 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    Denies any religious beliefs effecting health care.    Lives with husband and son in a 2 story home.     Education: college.      Current Outpatient Medications:  .  ALPRAZolam (XANAX)  0.25 MG tablet, 0.25 mg., Disp: , Rfl:  .  cyclobenzaprine (FLEXERIL) 10 MG tablet, Take 0.5-1 tablets (5-10 mg total) by mouth 3 (three) times daily as needed for muscle spasms., Disp: 60 tablet, Rfl: 0 .  Diclofenac Sodium (PENNSAID) 2 % SOLN, Place 1 application onto the skin 2 (two) times  daily., Disp: 1 Bottle, Rfl: 3 .  Ibuprofen-Famotidine 800-26.6 MG TABS, Take 1 tablet by mouth 3 (three) times daily as needed., Disp: 90 tablet, Rfl: 5 .  Multiple Vitamin (MULTIVITAMIN WITH MINERALS) TABS tablet, Take 1 tablet by mouth daily., Disp: , Rfl:  .  pregabalin (LYRICA) 75 MG capsule, Take 1 capsule (75 mg total) by mouth 2 (two) times daily., Disp: 60 capsule, Rfl: 2 .  Probiotic Product (PROBIOTIC ADVANCED PO), Take by mouth., Disp: , Rfl:  .  sertraline (ZOLOFT) 100 MG tablet, Take 1.5 tablets (150 mg total) by mouth daily., Disp: 45 tablet, Rfl: 1 .  valACYclovir (VALTREX) 500 MG tablet, Take 500 mg by mouth as needed for lip care., Disp: , Rfl: 4  No Known Allergies   ROS  Constitutional: Negative for fever or weight change.  Respiratory: Negative for cough and shortness of breath.  COUGH Cardiovascular: Negative for chest pain or palpitations.  Gastrointestinal: Negative for abdominal pain, no bowel changes.  Musculoskeletal: Negative for gait problem or joint swelling. Positive for joint pain. Skin: Negative for rash.  Neurological: Negative for dizziness or headache.  No other specific complaints in a complete review of systems (except as listed in HPI above).  Joint pain - maintained on lyrica 75 BID for diagnosis of fibromyalgia and chronic pain. She reports improvement in her pain and overall feels better today , does not report any complaints of adverse medication effects. She does c/o fatigue, which has been ongoing since she was diagnosed with Fibromyalgia- she often feels tired and has to take afternoon naps on her days off. Fatigue has slightly improved as she has increased  her daily activity  Objective  Vitals:   02/28/18 1159  BP: 120/72  Pulse: 86  Resp: 16  Temp: 98.6 F (37 C)  TempSrc: Oral  SpO2: 97%  Weight: 138 lb (62.6 kg)  Height:  (1.702 m)    Body mass index is 21.61 kg/m.  Physical Exam Vital signs reviewed. Constitutional: Patient appears well-developed and well-nourished. No distress.  HENT: Head: Normocephalic and atraumatic. Ears: B TMs ok, no erythema or effusion; Nose: Nose normal. Mouth/Throat: Oropharynx is clear and moist. No oropharyngeal exudate.  Eyes: Conjunctivae and EOM are normal. Pupils are equal, round, and reactive to light. No scleral icterus.  Neck: Normal range of motion. Neck supple. No cervical adenopathy present. No thyromegaly present.  Cardiovascular: Normal rate, regular rhythm and normal heart sounds.  No murmur heard. No BLE edema. Distal pulses intact Pulmonary/Chest: Effort normal and breath sounds normal. No respiratory distress. Abdominal: Soft. Bowel sounds are normal, no distension. There is no tenderness. no masses. Musculoskeletal: Normal range of motion, no joint effusions. No gross deformities Neurological: She is alert and oriented to person, place, and time. No cranial nerve deficit. Coordination, balance, strength, speech and gait are normal.  Skin: Skin is warm and dry. No rash noted. No erythema.  Psychiatric: Patient has a normal mood and affect. behavior is normal. Judgment and thought content normal.  Assessment & Plan RTC in 6 months for F/U: hypermobility and fibromyalgia-referral to PT today, fatigue   Other fatigue Update labs Could be related to chronic conditions, daily medications, seems to be improving with increased exercise Will place referral to PT today which will hopefully help her chronic pain related to hypermobility and fatigue F/U in 6 months or sooner for new, worsening symptoms - Ambulatory referral to Physical Therapy-jennifer paa recommended due to  hypermobility syndrome - CBC with Differential/Platelet;  Future - Comprehensive metabolic panel; Future - TSH; Future - VITAMIN D 25 Hydroxy (Vit-D Deficiency, Fractures); Future

## 2018-02-28 NOTE — Assessment & Plan Note (Signed)
Stable, continue lyrica at current dosage - Ambulatory referral to Physical Therapy

## 2018-03-10 ENCOUNTER — Encounter: Payer: Self-pay | Admitting: Nurse Practitioner

## 2018-03-10 DIAGNOSIS — M357 Hypermobility syndrome: Secondary | ICD-10-CM

## 2018-03-12 ENCOUNTER — Encounter: Payer: Self-pay | Admitting: Nurse Practitioner

## 2018-03-13 ENCOUNTER — Telehealth: Payer: Self-pay | Admitting: Nurse Practitioner

## 2018-03-13 NOTE — Telephone Encounter (Signed)
error 

## 2018-03-14 ENCOUNTER — Other Ambulatory Visit: Payer: Self-pay

## 2018-03-14 DIAGNOSIS — F329 Major depressive disorder, single episode, unspecified: Secondary | ICD-10-CM

## 2018-03-14 DIAGNOSIS — F419 Anxiety disorder, unspecified: Principal | ICD-10-CM

## 2018-03-14 MED ORDER — SERTRALINE HCL 100 MG PO TABS: 150.0000 mg | ORAL_TABLET | Freq: Every day | ORAL | 2 refills | Status: DC

## 2018-03-14 MED FILL — SERTRALINE HCL 100 MG TAB: 100 | 90 days supply | Qty: 135 | Fill #0

## 2018-03-21 ENCOUNTER — Other Ambulatory Visit: Payer: Self-pay | Admitting: Nurse Practitioner

## 2018-03-21 ENCOUNTER — Encounter: Payer: Self-pay | Admitting: Nurse Practitioner

## 2018-03-21 DIAGNOSIS — G894 Chronic pain syndrome: Secondary | ICD-10-CM

## 2018-03-21 NOTE — Telephone Encounter (Signed)
Please advise 

## 2018-03-21 NOTE — Telephone Encounter (Signed)
Last refill was 02/20/18 per database.

## 2018-03-22 MED FILL — LYRICA 75 MG CAPSULE: 75 | 30 days supply | Qty: 60 | Fill #0

## 2018-04-03 ENCOUNTER — Ambulatory Visit: Payer: No Typology Code available for payment source | Admitting: Physical Therapy

## 2018-04-17 ENCOUNTER — Other Ambulatory Visit: Payer: Self-pay

## 2018-04-17 ENCOUNTER — Encounter: Payer: Self-pay | Admitting: Physical Therapy

## 2018-04-17 ENCOUNTER — Ambulatory Visit: Payer: No Typology Code available for payment source | Attending: Nurse Practitioner | Admitting: Physical Therapy

## 2018-04-17 DIAGNOSIS — M5417 Radiculopathy, lumbosacral region: Secondary | ICD-10-CM | POA: Diagnosis present

## 2018-04-17 DIAGNOSIS — M357 Hypermobility syndrome: Secondary | ICD-10-CM | POA: Diagnosis present

## 2018-04-17 DIAGNOSIS — M25551 Pain in right hip: Secondary | ICD-10-CM | POA: Diagnosis present

## 2018-04-17 NOTE — Therapy (Signed)
[facility] [address] [place], [place], [address] Phone: [phone]   Fax:  [phone]  Physical Therapy Evaluation  Patient Details  Name: Angela [name] MRN: [id number] Date of Birth: 11/26/1980 Referring Provider:  Gelene MinkAshleigh Shamblay, NP    Encounter Date: 04/17/2018  PT End of Session - 04/17/18 1256    Visit Number  1    Number of Visits  12    Date for PT Re-Evaluation  05/29/18    PT Start Time  1017    PT Stop Time  1108    PT Time Calculation (min)  51 min    Activity Tolerance  Patient tolerated treatment well    Behavior During Therapy  [facility] for tasks assessed/performed       Past Medical History:  Diagnosis Date  . Abnormal Pap smear    repeat ok  . Fibromyalgia 09/02/2017  . Herpes zoster   . Panic attacks   . PCOS (polycystic ovarian syndrome)    conceived on metformin and clomid  . PP SVD 12/02/11 M 12/03/2011    Past Surgical History:  Procedure Laterality Date  . BREAST BIOPSY    . WISDOM TOOTH EXTRACTION      There were no vitals filed for this visit.   Subjective Assessment - 04/17/18 1024    Subjective  Patient has been worked up for chronic joint pain for many years.  She recalls hip pain <2 yrs.  She describes hip pain daily, has trouble walking up hill, steps or walking long distances.  She recently received a shot which did not help.  She does wear a SIJ compression brace.  She does get some intermittent pain radiation in to Rt leg. She notices weakness in Rt LE.  She has SIJ pain, but no back pain.  She also has pain in joint of the hand, knees and Rt side of neck.     Pertinent History  Diagnosed with fibromyalgia prior to her hypermobility diagnosis , polyarthralgia     Limitations  Sitting;Lifting;Standing;Walking;House hold activities    How long can you sit comfortably?  sits with leg crossed to relieve    How long can you stand comfortably?  fatigues after 1-2 hours     Diagnostic  tests  none     Patient Stated Goals  Patient would like to have pain relief, take less meds. Strengthening.     Currently in Pain?  Yes    Pain Score  4     Pain Location  Hip    Pain Orientation  Right    Pain Descriptors / Indicators  Aching;Constant    Pain Type  Chronic pain    Pain Radiating Towards  thigh R     Pain Onset  More than a month ago    Pain Frequency  Constant    Aggravating Factors   sitting, standing, walking , staying in one position too long     Pain Relieving Factors  sitting in figure 4 , ibuprofen, heat     Effect of Pain on Daily Activities  limits her comfort at work, resting , ADLs     Multiple Pain Sites  No         [facility] PT Assessment - 04/17/18 0001      Assessment   Medical Diagnosis  Hypermobility     Referring Provider   Gelene MinkAshleigh Shamblay, NP     Onset Date/Surgical Date  -- 2 yrs or more  Next MD Visit  unknown    Prior Therapy  No       Precautions   Precautions  None      Restrictions   Weight Bearing Restrictions  No      Balance Screen   Has the patient fallen in the past 6 months  No    Has the patient had a decrease in activity level because of a fear of falling?   No    Is the patient reluctant to leave their home because of a fear of falling?   No      Home Environment   Living Environment  Private residence    Living Arrangements  Spouse/significant other;Children    Type of Home  House    Home Access  Stairs to enter    Entrance Stairs-Number of Steps  2    Entrance Stairs-Rails  Right    Home Layout  Two level      Prior Function   Level of Independence  Independent    Vocation  Full time employment    Vocation Requirements  standing, lifting patients and equipment     Leisure  family time, vacation      Cognition   Overall Cognitive Status  Within Functional Limits for tasks assessed      Observation/Other Assessments   Focus on Therapeutic Outcomes (FOTO)   41%      Sensation   Light Touch  Appears Intact       Coordination   Gross Motor Movements are Fluid and Coordinated  Not tested      Functional Tests   Functional tests  Squat;Single leg stance      Squat   Comments  hyperextends through spine       Single Leg Stance   Comments  leans posteriorly on Rt LE, Lt LE WFL       Posture/Postural Control   Posture/Postural Control  Postural limitations    Posture Comments  knees hyperextend, Rt hip slightly higher       AROM   Lumbar Flexion  WFL    Lumbar Extension  WFL    Lumbar - Right Side Bend  WFL    Lumbar - Left Side Bend  WFL    Lumbar - Right Rotation  WFL    Lumbar - Left Rotation  [facility]      Strength   Right Hip Flexion  4+/5    Right Hip Extension  4+/5    Right Hip External Rotation   4+/5    Right Hip Internal Rotation  4+/5    Right Hip ABduction  4+/5    Left Hip Flexion  4+/5    Left Hip Extension  4+/5    Left Hip ABduction  4+/5    Right Knee Flexion  4+/5    Right Knee Extension  5/5    Left Knee Flexion  5/5    Left Knee Extension  5/5      Flexibility   Hamstrings  90/90 Rt 20, Lt 23 deg.     Piriformis  tight Rt side       Palpation   Spinal mobility  NT     SI assessment   sore lateral border SIJ     Palpation comment  painful throughout Rt glute med, especially piriformis and just distal                 Objective measurements completed on examination: See above findings.  PT Education - 04/17/18 1256    Education Details  PT/POC, HEP, SIJ stabilization , use of belt, core     Person(s) Educated  Patient    Methods  Explanation;Handout;Verbal cues    Comprehension  Verbalized understanding;Returned demonstration          PT Long Term Goals - 04/17/18 1320      PT LONG TERM GOAL #1   Title  Pt will be able to improve her FOTO score to 30% or less to demo improvement     Time  6    Period  Weeks    Status  New    Target Date  05/29/18      PT LONG TERM GOAL #2   Title  Pt will be able to stand  for 2 hours at work with pain <2/10 for radiation procedure.    Time  6    Period  Weeks    Status  New    Target Date  05/29/18      PT LONG TERM GOAL #3   Title  Pt will report pain becoming more minimal, intermittent for ADLs, mobility     Time  6    Period  Weeks    Status  New    Target Date  05/29/18      PT LONG TERM GOAL #4   Title  Pt will be able to squat with proper form, no pain for lifting reinforcement.     Time  6    Period  Weeks    Status  New    Target Date  05/29/18      PT LONG TERM GOAL #5   Title  Pt will be I with HEP for core, posture     Time  6    Period  Weeks    Target Date  05/29/18             Plan - 04/17/18 1257    Clinical Impression Statement  Patient presents for low complexity eval of Rt posterior hip pain which has become chronic and consistent with daily activities.  She presents with min weakness in Rt hip, most evident with Rt SLS.  She has hypermobility as diagnosed but hamstrings are tight bilaterally. She gets some relief with SIJ Helix brace.   Stretching piriformis does provide some relief but a SIJ instability /asymmetry likely is the source of her pain. We were unable to assess her neck today due to time.    Clinical Presentation  Stable    Clinical Decision Making  Low    Rehab Potential  Excellent    PT Frequency  2x / week    PT Duration  6 weeks    PT Treatment/Interventions  ADLs/Self Care Home Management;Electrical Stimulation;Functional mobility training;Neuromuscular re-education;Taping;Therapeutic activities;Iontophoresis 4mg /ml Dexamethasone;Cryotherapy;Ultrasound;Moist Heat;Therapeutic exercise;Patient/family education;Manual techniques;Passive range of motion;Stair training;Dry needling    PT Next Visit Plan  consider Tr DN to Rt piriformis, deep rotators . Progress hip and core.  check SIJ alignment     PT Home Exercise Plan  clam, reverse clam and hooklying blue band clam     Consulted and Agree with Plan of Care   Patient       Patient will benefit from skilled therapeutic intervention in order to improve the following deficits and impairments:  Pain, Postural dysfunction, Increased fascial restricitons, Hypermobility, Impaired flexibility, Decreased strength, Decreased range of motion, Decreased mobility  Visit Diagnosis: Hypermobility syndrome  Pain in right hip  Radiculopathy,  lumbosacral region     Problem List Patient Active Problem List   Diagnosis Date Noted  . Greater trochanteric bursitis of right hip 02/21/2018  . 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 Fibromyalgia 09/02/2017  . Chronic pain syndrome 07/06/2017  . Polyarthralgia 03/31/2017  . Fatigue 02/07/2017  . Routine general medical examination at a health care facility 02/11/2015  . PP SVD 12/02/11 35wks M 12/03/2011  . POLYCYSTIC OVARIAN DISEASE 01/11/2011  . Anxiety and depression 01/06/2010  . ALLERGIC RHINITIS 01/06/2010    Angela [name] 04/17/2018, 1:26 PM  [facility] [address] [place], [place], [address] Phone: [phone]   Fax:  [phone]  Name: Angela [name] MRN: [id number] Date of Birth: 09-03-1981   Angela [name], PT 04/17/18 1:27 PM Phone: [phone] Fax: [phone]

## 2018-04-18 ENCOUNTER — Encounter: Payer: Self-pay | Admitting: Nurse Practitioner

## 2018-04-18 ENCOUNTER — Encounter: Payer: Self-pay | Admitting: Physical Therapy

## 2018-04-19 MED FILL — LYRICA 75 MG CAPSULE: 75 | 30 days supply | Qty: 60 | Fill #1

## 2018-04-19 NOTE — Telephone Encounter (Signed)
I received the forms via fax today (06/19) Please advise if you approve or need her to make an appointment. - LOV was 4/30.  A [facility] form is : Certification of Health Care Provider - FMLA

## 2018-04-20 ENCOUNTER — Ambulatory Visit: Payer: No Typology Code available for payment source | Admitting: Physical Therapy

## 2018-04-20 ENCOUNTER — Other Ambulatory Visit: Payer: Self-pay

## 2018-04-20 ENCOUNTER — Encounter: Payer: Self-pay | Admitting: Physical Therapy

## 2018-04-20 DIAGNOSIS — M25551 Pain in right hip: Secondary | ICD-10-CM

## 2018-04-20 DIAGNOSIS — Z0279 Encounter for issue of other medical certificate: Secondary | ICD-10-CM

## 2018-04-20 DIAGNOSIS — M357 Hypermobility syndrome: Secondary | ICD-10-CM

## 2018-04-20 DIAGNOSIS — M5417 Radiculopathy, lumbosacral region: Secondary | ICD-10-CM

## 2018-04-20 NOTE — Telephone Encounter (Signed)
Frequencies being requested is 2 x a week for 2 to 4 hours.   Forms have been completed & placed in providers box to review and sign.   Forms have signed, faxed to [employer], copy sent to scan &charged for.   Original mailed to patient as requested.

## 2018-04-20 NOTE — Therapy (Signed)
[facility] [address] [place], [place], [address] Phone: [phone]   Fax:  [phone]  Physical Therapy Treatment  Patient Details  Name: Angela [name] MRN: [id number] Date of Birth: 06-Jun-1981 Referring Provider:  Gelene Mink, NP    Encounter Date: 04/20/2018  PT End of Session - 04/20/18 1231    Visit Number  2    Number of Visits  12    Date for PT Re-Evaluation  05/29/18    PT Start Time  1145    PT Stop Time  1240    PT Time Calculation (min)  55 min    Activity Tolerance  Patient tolerated treatment well    Behavior During Therapy  [facility] for tasks assessed/performed       Past Medical History:  Diagnosis Date  . Abnormal Pap smear    repeat ok  . Fibromyalgia 09/02/2017  . Herpes zoster   . Panic attacks   . PCOS (polycystic ovarian syndrome)    conceived on metformin and clomid  . PP SVD 12/02/11 M 12/03/2011    Past Surgical History:  Procedure Laterality Date  . BREAST BIOPSY    . WISDOM TOOTH EXTRACTION      There were no vitals filed for this visit.  Subjective Assessment - 04/20/18 1146    Subjective  Pt enters clinic and points to right hip with pain and comments she is a little more sore after doing more clamshell exercises. My pain in my hip is always there    Pertinent History  Diagnosed with fibromyalgia prior to her hypermobility diagnosis , polyarthralgia     Limitations  Sitting;Lifting;Standing;Walking;House hold activities    Diagnostic tests  none     Patient Stated Goals  Patient would like to have pain relief, take less meds. Strengthening.     Currently in Pain?  Yes    Pain Score  5     Pain Location  Hip    Pain Orientation  Right    Pain Descriptors / Indicators  Constant;Aching    Pain Type  Chronic pain    Pain Onset  More than a month ago    Pain Frequency  Constant                       [facility] Adult PT Treatment/Exercise - 04/20/18 1148      Posture/Postural Control   Posture/Postural Control  Postural limitations    Posture Comments  Pt with elevated right QL with added shearing force at SIJ      Self-Care   Self-Care  Other Self-Care Comments    Other Self-Care Comments   education on TPDN after care and precautians       Exercises   Exercises  Knee/Hip      Lumbar Exercises: Stretches   Quadruped Mid Back Stretch  4 reps;20 seconds to 30 seconds    Quadruped Mid Back Stretch Limitations  2 forward and 2 to left to stretch right side       Knee/Hip Exercises: Supine   Bridges  2 sets;Both;10 reps no pain      Knee/Hip Exercises: Sidelying   Hip ABduction  Left;10 reps  reverse and the long lever arm with more pain than clam    Clams  15 x 2 left sidelying    Other Sidelying Knee/Hip Exercises  reverse clamshell on left sidelying 15 x 2      Modalities   Modalities  Moist Heat      Moist Heat Therapy   Number Minutes Moist Heat  15 Minutes    Moist Heat Location  Hip right      Manual Therapy   Manual Therapy  Soft tissue mobilization;Myofascial release    Manual therapy comments  skilled palpation with TPDN    Soft tissue mobilization  right gluteals and piriformis, sacral scrubbing with IASTM tool    Myofascial Release  right Quadratus Lumborum       Trigger Point Dry Needling - 04/20/18 1202    Consent Given?  Yes    Education Handout Provided  Yes    Muscles Treated Upper Body  Quadratus Lumborum right side only    Muscles Treated Lower Body  Gluteus maximus;Gluteus minimus;Piriformis    Gluteus Maximus Response  Twitch response elicited;Palpable increased muscle length    Gluteus Minimus Response  Twitch response elicited;Palpable increased muscle length    Piriformis Response  Twitch response elicited;Palpable increased muscle length           PT Education - 04/20/18 1159    Education Details  gave Quadratus information and sidelying stretch post needling.  edcuaton on TPDN precautian and  aftercare    Person(s) Educated  Patient    Methods  Explanation;Demonstration;Tactile cues;Verbal cues;Handout    Comprehension  Verbalized understanding;Returned demonstration          PT Long Term Goals - 04/17/18 1320      PT LONG TERM GOAL #1   Title  Pt will be able to improve her FOTO score to 30% or less to demo improvement     Time  6    Period  Weeks    Status  New    Target Date  05/29/18      PT LONG TERM GOAL #2   Title  Pt will be able to stand for 2 hours at work with pain <2/10 for radiation procedure.    Time  6    Period  Weeks    Status  New    Target Date  05/29/18      PT LONG TERM GOAL #3   Title  Pt will report pain becoming more minimal, intermittent for ADLs, mobility     Time  6    Period  Weeks    Status  New    Target Date  05/29/18      PT LONG TERM GOAL #4   Title  Pt will be able to squat with proper form, no pain for lifting reinforcement.     Time  6    Period  Weeks    Status  New    Target Date  05/29/18      PT LONG TERM GOAL #5   Title  Pt will be I with HEP for core, posture     Time  6    Period  Weeks    Target Date  05/29/18            Plan - 04/20/18 1227    Clinical Impression Statement  Pt returns to clinic with 5/10 pain in right hip that is constant.  Pt with right elevated quadratus lumborum in standing and consents to TPDN for trigger points in gluteals and right piriformis. [name] consents to [facility] and is closely monitored throughout session  Pt is able to perform exercise but pain increased with right hip abduction with long lever arm.  Pt is able to perform all exercises except  reverse clamshell which was painful.  After TPDN , pt was able to stand without pain.  [name] understands importance of strngthening with Hypermobility and looks forward to strengthening with Angela [name] PT.    Rehab Potential  Excellent    PT Frequency  2x / week    PT Duration  6 weeks    PT Treatment/Interventions  ADLs/Self Care  Home Management;Electrical Stimulation;Functional mobility training;Neuromuscular re-education;Taping;Therapeutic activities;Iontophoresis 4mg /ml Dexamethasone;Cryotherapy;Ultrasound;Moist Heat;Therapeutic exercise;Patient/family education;Manual techniques;Passive range of motion;Stair training;Dry needling    PT Next Visit Plan  assess TPDN, Progress hip and core, check SIJ alignement    PT Home Exercise Plan  clam, reverse clam and hooklying blue band clam bridge, hip abd    Consulted and Agree with Plan of Care  Patient       Patient will benefit from skilled therapeutic intervention in order to improve the following deficits and impairments:  Pain, Postural dysfunction, Increased fascial restricitons, Hypermobility, Impaired flexibility, Decreased strength, Decreased range of motion, Decreased mobility  Visit Diagnosis: Hypermobility syndrome  Pain in right hip  Radiculopathy, lumbosacral region     Problem List Patient Active Problem List   Diagnosis Date Noted  . Greater trochanteric bursitis of right hip 02/21/2018  . 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 Fibromyalgia 09/02/2017  . Chronic pain syndrome 07/06/2017  . Polyarthralgia 03/31/2017  . Fatigue 02/07/2017  . Routine general medical examination at a health care facility 02/11/2015  . PP SVD 12/02/11 35wks M 12/03/2011  . POLYCYSTIC OVARIAN DISEASE 01/11/2011  . Anxiety and depression 01/06/2010  . ALLERGIC RHINITIS 01/06/2010    Garen Lah, PT Certified Exercise Expert for the Aging Adult  04/20/18 12:35 PM Phone: [phone] Fax: [phone]  [facility] [address] [place], [place], [address] Phone: [phone]   Fax:  [phone]  Name: Angela [name] MRN: [id number] Date of Birth: 11/02/1980

## 2018-04-20 NOTE — Patient Instructions (Addendum)
Trigger Point Dry Needling  . What is Trigger Point Dry Needling (DN)? o DN is a physical therapy technique used to treat muscle pain and dysfunction. Specifically, DN helps deactivate muscle trigger points (muscle knots).  o A thin filiform needle is used to penetrate the skin and stimulate the underlying trigger point. The goal is for a local twitch response (LTR) to occur and for the trigger point to relax. No medication of any kind is injected during the procedure.   . What Does Trigger Point Dry Needling Feel Like?  o The procedure feels different for each individual patient. Some patients report that they do not actually feel the needle enter the skin and overall the process is not painful. Very mild bleeding may occur. However, many patients feel a deep cramping in the muscle in which the needle was inserted. This is the local twitch response.   Marland Kitchen. How Will I feel after the treatment? o Soreness is normal, and the onset of soreness may not occur for a few hours. Typically this soreness does not last longer than two days.  o Bruising is uncommon, however; ice can be used to decrease any possible bruising.  o In rare cases feeling tired or nauseous after the treatment is normal. In addition, your symptoms may get worse before they get better, this period will typically not last longer than 24 hours.   . What Can I do After My Treatment? o Increase your hydration by drinking more water for the next 24 hours. o You may place ice or heat on the areas treated that have become sore, however, do not use heat on inflamed or bruised areas. Heat often brings more relief post needling. o You can continue your regular activities, but vigorous activity is not recommended initially after the treatment for 24 hours. o DN is best combined with other physical therapy such as strengthening, stretching, and other therapies.   Pt given Quadratus lumborum handout for stretch on left sidelying while using heat  post TPDN.  Lie down for 5 minutes  Marjo BickerChilds pose forward for 30 seconds 2-3 time and 2 times to the left to stretch right Quadratus lumborum

## 2018-04-24 ENCOUNTER — Encounter: Payer: Self-pay | Admitting: Physical Therapy

## 2018-04-24 ENCOUNTER — Ambulatory Visit: Payer: No Typology Code available for payment source | Admitting: Physical Therapy

## 2018-04-24 DIAGNOSIS — M357 Hypermobility syndrome: Secondary | ICD-10-CM

## 2018-04-24 DIAGNOSIS — M5417 Radiculopathy, lumbosacral region: Secondary | ICD-10-CM

## 2018-04-24 DIAGNOSIS — M25551 Pain in right hip: Secondary | ICD-10-CM

## 2018-04-24 NOTE — Therapy (Signed)
[facility] [address] [place], [place], [address] Phone: [phone]   Fax:  [phone]  Physical Therapy Treatment  Patient Details  Name: Angela DillonHeather L [name] MRN: [id number] Date of Birth: [birth year] Referring Provider:  Gelene MinkAshleigh Shamblay, NP    Encounter Date: 04/24/2018  PT End of Session - 04/24/18 1338    Visit Number  3    Number of Visits  12    Date for PT Re-Evaluation  05/29/18    PT Start Time  1330    PT Stop Time  1428    PT Time Calculation (min)  58 min    Activity Tolerance  Patient tolerated treatment well    Behavior During Therapy  [facility] for [facility] assessed/performed       Past Medical History:  Diagnosis Date  . Abnormal Pap smear    repeat ok  . Fibromyalgia 09/02/2017  . Herpes zoster   . Panic attacks   . PCOS (polycystic ovarian syndrome)    conceived on metformin and clomid  . PP SVD 12/02/11 M 12/03/2011    Past Surgical History:  Procedure Laterality Date  . BREAST BIOPSY    . WISDOM TOOTH EXTRACTION      There were no vitals filed for this visit.  Subjective Assessment - 04/24/18 1333    Subjective  Pt was good (no pain) until Sunday and she was hurting again.  Pain is localized to Rt buttock 4/10.      Currently in Pain?  Yes    Pain Score  4     Pain Location  Hip    Pain Orientation  Right    Pain Descriptors / Indicators  Aching    Pain Type  Chronic pain    Pain Onset  More than a month ago    Pain Frequency  Intermittent             [facility] Adult PT Treatment/Exercise - 04/24/18 0001      Lumbar Exercises: Stretches   Piriformis Stretch  2 reps    Figure 4 Stretch  3 reps    Figure 4 Stretch Limitations  preferred for Rt hip pain       Lumbar Exercises: Supine   Clam  20 reps    Clam Limitations  alternating unilateral  on ball     Bent Knee Raise  10 reps    Dead Bug Limitations  variation with SLR on ball       Knee/Hip Exercises: Supine   Bridges   Strengthening;Both;1 set;10 reps    Bridges with Clamshell  Strengthening;Both;1 set;10 reps      Knee/Hip Exercises: Sidelying   Hip ABduction  Strengthening;Both;1 set;15 reps    Clams  15 reps x  blue       Moist Heat Therapy   Number Minutes Moist Heat  15 Minutes    Moist Heat Location  Hip      Manual Therapy   Soft tissue mobilization  Rt gluteals and piriiformis, Rt QL     Myofascial Release  Rt trunk in sidelying              PT Education - 04/24/18 1338    Education Details  piriformis stretching     Person(s) Educated  Patient    Methods  Explanation    Comprehension  Verbalized understanding          PT Long Term Goals - 04/17/18 1320      PT  LONG TERM GOAL #1   Title  Pt will be able to improve her FOTO score to 30% or less to demo improvement     Time  6    Period  Weeks    Status  New    Target Date  05/29/18      PT LONG TERM GOAL #2   Title  Pt will be able to stand for 2 hours at work with pain <2/10 for radiation procedure.    Time  6    Period  Weeks    Status  New    Target Date  05/29/18      PT LONG TERM GOAL #3   Title  Pt will report pain becoming more minimal, intermittent for ADLs, mobility     Time  6    Period  Weeks    Status  New    Target Date  05/29/18      PT LONG TERM GOAL #4   Title  Pt will be able to squat with proper form, no pain for lifting reinforcement.     Time  6    Period  Weeks    Status  New    Target Date  05/29/18      PT LONG TERM GOAL #5   Title  Pt will be I with HEP for core, posture     Time  6    Period  Weeks    Target Date  05/29/18            Plan - 04/24/18 1519    Clinical Impression Statement  Pt with excellent response to TPDN.  Light bruising noticeable along glute med and Rt QL.  She was able to report a more localized pain at Rt piriformis. She was able to tolerate stabilization exercises without difficulty. Less pain post session.       PT Treatment/Interventions   ADLs/Self Care Home Management;Electrical Stimulation;Functional mobility training;Neuromuscular re-education;Taping;Therapeutic activities;Iontophoresis 4mg /ml Dexamethasone;Cryotherapy;Ultrasound;Moist Heat;Therapeutic exercise;Patient/family education;Manual techniques;Passive range of motion;Stair training;Dry needling    PT Next Visit Plan  repeat TPDN, Progress hip and core, check SIJ alignment., consider Pilates     PT Home Exercise Plan  clam, reverse clam and hooklying blue band clam bridge, hip abd    Consulted and Agree with Plan of Care  Patient       Patient will benefit from skilled therapeutic intervention in order to improve the following deficits and impairments:  Pain, Postural dysfunction, Increased fascial restricitons, Hypermobility, Impaired flexibility, Decreased strength, Decreased range of motion, Decreased mobility  Visit Diagnosis: Hypermobility syndrome  Pain in right hip  Radiculopathy, lumbosacral region     Problem List Patient Active Problem List   Diagnosis Date Noted  . Greater trochanteric bursitis of right hip 02/21/2018  . 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 Fibromyalgia 09/02/2017  . Chronic pain syndrome 07/06/2017  . Polyarthralgia 03/31/2017  . Fatigue 02/07/2017  . Routine general medical examination at a health care facility 02/11/2015  . PP SVD 12/02/11 35wks M 12/03/2011  . POLYCYSTIC OVARIAN DISEASE 01/11/2011  . Anxiety and depression 01/06/2010  . ALLERGIC RHINITIS 01/06/2010    PAA,JENNIFER 04/24/2018, 3:22 PM  [facility] [address] [place], [place], [address] Phone: [phone]   Fax:  [phone]  Name: Angela [name] MRN: [id number] Date of Birth: Mar 21, 1981  Karie Mainland, PT 04/24/18 3:22 PM Phone: [phone] Fax: [phone]

## 2018-04-26 ENCOUNTER — Encounter: Payer: Self-pay | Admitting: Physical Therapy

## 2018-04-26 ENCOUNTER — Ambulatory Visit: Payer: No Typology Code available for payment source | Admitting: Physical Therapy

## 2018-04-26 DIAGNOSIS — M357 Hypermobility syndrome: Secondary | ICD-10-CM

## 2018-04-26 DIAGNOSIS — M25551 Pain in right hip: Secondary | ICD-10-CM

## 2018-04-26 DIAGNOSIS — M5417 Radiculopathy, lumbosacral region: Secondary | ICD-10-CM

## 2018-04-26 NOTE — Therapy (Signed)
[facility] [address] [place], [place], [address] Phone: [phone]   Fax:  [phone]  Physical Therapy Treatment  Patient Details  Name: Angela [name] MRN: [id number] Date of Birth: [birth year] Referring Provider:  Gelene Mink, NP    Encounter Date: 04/26/2018  PT End of Session - 04/26/18 1002    Visit Number  4    Number of Visits  12    Date for PT Re-Evaluation  05/29/18    PT Start Time  1008    PT Stop Time  1059    PT Time Calculation (min)  51 min    Activity Tolerance  Patient tolerated treatment well    Behavior During Therapy  [facility] for tasks assessed/performed       Past Medical History:  Diagnosis Date  . Abnormal Pap smear    repeat ok  . Fibromyalgia 09/02/2017  . Herpes zoster   . Panic attacks   . PCOS (polycystic ovarian syndrome)    conceived on metformin and clomid  . PP SVD 12/02/11 M 12/03/2011    Past Surgical History:  Procedure Laterality Date  . BREAST BIOPSY    . WISDOM TOOTH EXTRACTION      There were no vitals filed for this visit.  Subjective Assessment - 04/26/18 1015    Subjective  Reports a Rt "band" of weakness along thigh.  Pain is about 5/10.      Currently in Pain?  Yes    Pain Score  5     Pain Location  Hip    Pain Orientation  Right    Pain Descriptors / Indicators  Other (Comment) weakness    Pain Type  Chronic pain    Pain Onset  More than a month ago    Pain Frequency  Intermittent                       [facility] Adult PT Treatment/Exercise - 04/26/18 0001      Pilates   Pilates Reformer  See note        Pilates Reformer used for LE/core strength, postural strength, lumbopelvic disassociation and core control.  Exercises included: Footwork 2 Red 1 blue parallel and turn out heels and toes  Tendon stretch  Bridging 2 Red 1 Blue x 10 added press out with hips lifted x 5, used ball for alignment  Single leg footwork 2 Red fatigued on Rt,  cues for knee alignment, (hip adducts)   Feet in Straps 1 Red 1 yellow Arcs in parallel and in hip ER , cues to avoid abdominal wall pushing outward, breathwork   Squats x 10 , circles in parallel x 5 each direction  Quadruped 1 Red x 10 UE only.  Added LE x 10 red spring and then 1 blue x 10 more.         PT Education - 04/26/18 1301    Education Details  Stabilization and pilates Reformer    Person(s) Educated  Patient    Methods  Explanation;Verbal cues;Tactile cues    Comprehension  Verbalized understanding;Returned demonstration          PT Long Term Goals - 04/26/18 1302      PT LONG TERM GOAL #1   Title  Pt will be able to improve her FOTO score to 30% or less to demo improvement     Status  On-going      PT LONG TERM GOAL #2  Title  Pt will be able to stand for 2 hours at work with pain <2/10 for radiation procedure.    Status  On-going      PT LONG TERM GOAL #3   Title  Pt will report pain becoming more minimal, intermittent for ADLs, mobility     Status  On-going      PT LONG TERM GOAL #4   Title  Pt will be able to squat with proper form, no pain for lifting reinforcement.     Status  On-going      PT LONG TERM GOAL #5   Title  Pt will be I with HEP for core, posture     Status  On-going            Plan - 04/26/18 1302    Clinical Impression Statement  Pt with Rt LE fatigue, weakness described today (pain in Rt buttock) as a new symptoms.  May be related to her back, L4-L5 nerve distribution.  Integrates cues well with Pilates stabilization exercises on the Reformer. No pain as she left the clinic.  Rt LE symptoms did not change.     PT Treatment/Interventions  ADLs/Self Care Home Management;Electrical Stimulation;Functional mobility training;Neuromuscular re-education;Taping;Therapeutic activities;Iontophoresis 4mg /ml Dexamethasone;Cryotherapy;Ultrasound;Moist Heat;Therapeutic exercise;Patient/family education;Manual techniques;Passive range of  motion;Stair training;Dry needling    PT Next Visit Plan  repeat TPDN? , Progress hip and core, check SIJ alignment., consider Pilates     PT Home Exercise Plan  clam, reverse clam and hooklying blue band clam bridge, hip abd    Consulted and Agree with Plan of Care  Patient       Patient will benefit from skilled therapeutic intervention in order to improve the following deficits and impairments:  Pain, Postural dysfunction, Increased fascial restricitons, Hypermobility, Impaired flexibility, Decreased strength, Decreased range of motion, Decreased mobility  Visit Diagnosis: Hypermobility syndrome  Pain in right hip  Radiculopathy, lumbosacral region     Problem List Patient Active Problem List   Diagnosis Date Noted  . Greater trochanteric bursitis of right hip 02/21/2018  . 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 Fibromyalgia 09/02/2017  . Chronic pain syndrome 07/06/2017  . Polyarthralgia 03/31/2017  . Fatigue 02/07/2017  . Routine general medical examination at a health care facility 02/11/2015  . PP SVD 12/02/11 35wks M 12/03/2011  . POLYCYSTIC OVARIAN DISEASE 01/11/2011  . Anxiety and depression 01/06/2010  . ALLERGIC RHINITIS 01/06/2010    Jakavion Bilodeau 04/26/2018, 1:06 PM  [facility] [address] [place], [place], [address] Phone: [phone]   Fax:  [phone]  Name: Ricard DillonHeather L Madill MRN: [id number] Date of Birth: [birth year]  Karie MainlandJennifer Angelica Wix, PT 04/26/18 1:06 PM Phone: [phone] Fax: [phone]

## 2018-04-30 ENCOUNTER — Encounter: Payer: Self-pay | Admitting: Nurse Practitioner

## 2018-05-01 ENCOUNTER — Other Ambulatory Visit (INDEPENDENT_AMBULATORY_CARE_PROVIDER_SITE_OTHER): Payer: No Typology Code available for payment source

## 2018-05-01 ENCOUNTER — Ambulatory Visit (INDEPENDENT_AMBULATORY_CARE_PROVIDER_SITE_OTHER): Payer: No Typology Code available for payment source | Admitting: Nurse Practitioner

## 2018-05-01 ENCOUNTER — Encounter: Payer: Self-pay | Admitting: Nurse Practitioner

## 2018-05-01 ENCOUNTER — Ambulatory Visit (INDEPENDENT_AMBULATORY_CARE_PROVIDER_SITE_OTHER)
Admission: RE | Admit: 2018-05-01 | Discharge: 2018-05-01 | Disposition: A | Discharge status: Home / Self Care | Payer: No Typology Code available for payment source | Source: Ambulatory Visit | Attending: Nurse Practitioner | Admitting: Nurse Practitioner

## 2018-05-01 ENCOUNTER — Ambulatory Visit: Payer: No Typology Code available for payment source | Admitting: Physical Therapy

## 2018-05-01 VITALS — BP 128/74 | HR 102 | Temp 98.3°F | Resp 16 | Ht 67.0 in | Wt 136.8 lb

## 2018-05-01 DIAGNOSIS — M5441 Lumbago with sciatica, right side: Secondary | ICD-10-CM | POA: Diagnosis not present

## 2018-05-01 LAB — URINALYSIS
BILIRUBIN URINE: NEGATIVE
HGB URINE DIPSTICK: NEGATIVE
Ketones, ur: NEGATIVE
Leukocytes, UA: NEGATIVE
NITRITE: NEGATIVE
PH: 6 (ref 5.0–8.0)
Specific Gravity, Urine: <=1.005 — AB (ref 1.000–1.030)
TOTAL PROTEIN, URINE-UPE24: NEGATIVE
URINE GLUCOSE: NEGATIVE
Urobilinogen, UA: 0.2 (ref 0.0–1.0)

## 2018-05-01 NOTE — Progress Notes (Signed)
Name: Angela [name]   MRN: [id number]    DOB: 05-08-1981   Date:05/01/2018       Progress Note  Subjective  Chief Complaint  Chief Complaint  Patient presents with  . Back Pain    right side lower back pain since friday, pain goes down right leg and to knee    HPI  Angela [name] is here today for an acute complaint of lower back pain, radiating down right leg to right knee, beginning on this past Thursday. She initially began to feel weakness, tingling to top of right thigh on Thursday, then Friday evening started to notice a lower back pain which she describes as constant throbbing pain radiating down right leg. She report tenderness to her lower back and urinary frequency. She denies fevers, falls, rash, swelling, discoloration, dysuria, hematuria, bowel or bladder changes. She has been trying heat, ice, ibuprofen and flexeril at home with no relief. She denies any recent heavy lifting or known injuries, but is currently doing PT, dry needling for hypermobility syndrome, which she feels has helped her chronic pain some.   Patient Active Problem List   Diagnosis Date Noted  . Greater trochanteric bursitis of right hip 02/21/2018  . 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 Fibromyalgia 09/02/2017  . Chronic pain syndrome 07/06/2017  . Polyarthralgia 03/31/2017  . Fatigue 02/07/2017  . Routine general medical examination at a health care facility 02/11/2015  . PP SVD 12/02/11 35wks M 12/03/2011  . POLYCYSTIC OVARIAN DISEASE 01/11/2011  . Anxiety and depression 01/06/2010  . ALLERGIC RHINITIS 01/06/2010    Social History   Tobacco Use  . Smoking status: Former Smoker    Last attempt to quit: 11/01/2009    Years since quitting: 8.5  . Smokeless tobacco: Never Used  Substance Use Topics  . Alcohol use: Yes    Alcohol/week: 1.2 oz    Types: 2 Cans of beer per week     Current Outpatient Medications:  .  ALPRAZolam (XANAX) 0.25 MG tablet, 0.25 mg., Disp: , Rfl:  .   cyclobenzaprine (FLEXERIL) 10 MG tablet, Take 0.5-1 tablets (5-10 mg total) by mouth 3 (three) times daily as needed for muscle spasms., Disp: 60 tablet, Rfl: 0 .  Diclofenac Sodium (PENNSAID) 2 % SOLN, Place 1 application onto the skin 2 (two) times daily., Disp: 1 Bottle, Rfl: 3 .  Ibuprofen-Famotidine 800-26.6 MG TABS, Take 1 tablet by mouth 3 (three) times daily as needed., Disp: 90 tablet, Rfl: 5 .  LYRICA 75 MG capsule, TAKE 1 CAPSULE (75 MG TOTAL) BY MOUTH 2 (TWO) TIMES DAILY., Disp: 60 capsule, Rfl: 2 .  Multiple Vitamin (MULTIVITAMIN WITH MINERALS) TABS tablet, Take 1 tablet by mouth daily., Disp: , Rfl:  .  Probiotic Product (PROBIOTIC ADVANCED PO), Take by mouth., Disp: , Rfl:  .  sertraline (ZOLOFT) 100 MG tablet, Take 1.5 tablets (150 mg total) by mouth daily., Disp: 135 tablet, Rfl: 2 .  valACYclovir (VALTREX) 500 MG tablet, Take 500 mg by mouth as needed for lip care., Disp: , Rfl: 4  No Known Allergies  ROS  No other specific complaints in a complete review of systems (except as listed in HPI above).  Objective  Vitals:   05/01/18 1258  BP: 128/74  Pulse: (!) 102  Resp: 16  Temp: 98.3 F (36.8 C)  TempSrc: Oral  SpO2: 97%  Weight: 136 lb 12.8 oz (62.1 kg)  Height: 5\' 7"  (1.702 m)   Body mass index is 21.43 kg/m.  Nursing  Note and Vital Signs reviewed.  Physical Exam  Constitutional: Patient appears well-developed and well-nourished.  No distress.  HEENT: head atraumatic, normocephalic, pupils equal and reactive to light, EOM's intact, neck supple, oropharynx pink and moist without exudate Cardiovascular: Normal rate, regular rhythm, S1/S2 present. No BLE edema. Distal pulses intact Pulmonary/Chest: Effort normal and breath sounds clear. No respiratory distress or retractions. Abdominal: Soft, no distension, No CVA Tenderness. Musculoskeletal: Normal range of motion, No gross deformities. Lower back tenderness, no swelling or deformity. Neurological: She is  alert and oriented to person, place, and time. No cranial nerve deficit. Coordination, balance, strength, speech and gait are normal.  Skin: Skin is warm and dry. No rash noted. No erythema. Psychiatric: Patient has a normal mood and affect. behavior is normal. Judgment and thought content normal.   Assessment & Plan  1. Acute midline low back pain with right-sided sciatica Urine testing today to rule out acute infection, x-ray per patient request Discussed home management of acute LBP including return precautions, printed information provided on AVS Continue PT for back strengthening and hypermobility syndrome RTC for new, worsening symptoms or if pain persists Could consider steroid course for continued pain - DG Lumbar Spine Complete; Future - Urinalysis; Future - Urine Culture; Future   *

## 2018-05-01 NOTE — Patient Instructions (Signed)
Please head downstairs for lab work/x-rays. If any of your test results are critically abnormal, you will be contacted right away. Otherwise, I will contact you within a week about your test results and any recommendations for abnormalities.  PT will determine if dry needling should continue, of course after we make sure x-ray is okay today.  Please follow up for new, worsening symptoms or if symptoms dont improve.   Back Pain, Adult Back pain is very common. The pain often gets better over time. The cause of back pain is usually not dangerous. Most people can learn to manage their back pain on their own. Follow these instructions at home: Watch your back pain for any changes. The following actions may help to lessen any pain you are feeling:  Stay active. Start with short walks on flat ground if you can. Try to walk farther each day.  Exercise regularly as told by your doctor. Exercise helps your back heal faster. It also helps avoid future injury by keeping your muscles strong and flexible.  Do not sit, drive, or stand in one place for more than 30 minutes.  Do not stay in bed. Resting more than 1-2 days can slow down your recovery.  Be careful when you bend or lift an object. Use good form when lifting: ? Bend at your knees. ? Keep the object close to your body. ? Do not twist.  Sleep on a firm mattress. Lie on your side, and bend your knees. If you lie on your back, put a pillow under your knees.  Take medicines only as told by your doctor.  Put ice on the injured area. ? Put ice in a plastic bag. ? Place a towel between your skin and the bag. ? Leave the ice on for 20 minutes, 2-3 times a day for the first 2-3 days. After that, you can switch between ice and heat packs.  Avoid feeling anxious or stressed. Find good ways to deal with stress, such as exercise.  Maintain a healthy weight. Extra weight puts stress on your back.  Contact a doctor if:  You have pain that does  not go away with rest or medicine.  You have worsening pain that goes down into your legs or buttocks.  You have pain that does not get better in one week.  You have pain at night.  You lose weight.  You have a fever or chills. Get help right away if:  You cannot control when you poop (bowel movement) or pee (urinate).  Your arms or legs feel weak.  Your arms or legs lose feeling (numbness).  You feel sick to your stomach (nauseous) or throw up (vomit).  You have belly (abdominal) pain.  You feel like you may pass out (faint). This information is not intended to replace advice given to you by your health care provider. Make sure you discuss any questions you have with your health care provider. Document Released: 04/05/2008 Document Revised: 03/25/2016 Document Reviewed: 02/19/2014 Elsevier Interactive Patient Education  [employer]2018 [employer]

## 2018-05-02 ENCOUNTER — Encounter: Payer: Self-pay | Admitting: Physical Therapy

## 2018-05-02 ENCOUNTER — Ambulatory Visit: Payer: No Typology Code available for payment source | Attending: Nurse Practitioner | Admitting: Physical Therapy

## 2018-05-02 DIAGNOSIS — M25551 Pain in right hip: Secondary | ICD-10-CM | POA: Diagnosis present

## 2018-05-02 DIAGNOSIS — M5417 Radiculopathy, lumbosacral region: Secondary | ICD-10-CM | POA: Diagnosis present

## 2018-05-02 DIAGNOSIS — M357 Hypermobility syndrome: Secondary | ICD-10-CM | POA: Diagnosis present

## 2018-05-02 NOTE — Therapy (Signed)
[facility] [address] [place], [place], [address] Phone: [phone]   Fax:  [phone]  Physical Therapy Treatment  Patient Details  Name: Angela [name] MRN: [id number] Date of Birth: [birth year] Referring Provider:  Gelene Mink, NP    Encounter Date: 05/02/2018  PT End of Session - 05/02/18 0850    Visit Number  5    Number of Visits  12    Date for PT Re-Evaluation  05/29/18    PT Start Time  0844    PT Stop Time  0940    PT Time Calculation (min)  56 min    Activity Tolerance  Patient tolerated treatment well    Behavior During Therapy  [facility] for tasks assessed/performed       Past Medical History:  Diagnosis Date  . Abnormal Pap smear    repeat ok  . Fibromyalgia 09/02/2017  . Herpes zoster   . Panic attacks   . PCOS (polycystic ovarian syndrome)    conceived on metformin and clomid  . PP SVD 12/02/11 M 12/03/2011    Past Surgical History:  Procedure Laterality Date  . BREAST BIOPSY    . WISDOM TOOTH EXTRACTION      There were no vitals filed for this visit.  Subjective Assessment - 05/02/18 0845    Subjective  Pt reports since last Thursday a band of " weakness" from right SI to the front of my thigh. Yesterday they did an x ray of my lumbar spine and I havent heard yet    Pertinent History  Diagnosed with fibromyalgia prior to her hypermobility diagnosis , polyarthralgia     Limitations  Sitting;Lifting;Standing;Walking;House hold activities    How long can you sit comfortably?  I never sit but max 15-20 minutes and then fidgets    How long can you stand comfortably?  fatigues after an hour and tries to unweight right side    How long can you walk comfortably?  walking is better unless walking up hill    Diagnostic tests  none     Patient Stated Goals  Patient would like to have pain relief, take less meds. Strengthening.     Currently in Pain?  Yes    Pain Score  7     Pain Location  Hip    Pain  Orientation  Right    Pain Descriptors / Indicators  Pounding;Aching weakness    Pain Type  Chronic pain    Pain Radiating Towards  right ant thigh    Pain Onset  More than a month ago    Pain Frequency  Intermittent                       [facility] Adult PT Treatment/Exercise - 05/02/18 0849      Lumbar Exercises: Stretches   Quadruped Mid Back Stretch  4 reps;20 seconds to 30 seconds    Quadruped Mid Back Stretch Limitations  2 forward and 2 to left to stretch right side       Lumbar Exercises: Supine   Ab Set  10 reps;Limitations    AB Set Limitations  10 sec hold    Pelvic Tilt  10 reps;5 seconds x 2    Clam  10 reps  x2 knee drop right and left each    Bent Knee Raise  10 reps  x 2    Bridge  5 reps feels unstable    Bridge with [employer]  10 reps;Limitations    Bridge with [employer] Limitations  x 2 pain free and more stable      Knee/Hip Exercises: Seated   Sit to [employer]  1 set mini squat with VC for decreasing hyperextension      Knee/Hip Exercises: Prone   Other Prone Exercises  prone press up x 5 pt does not notice any difference in discomfort      Moist Heat Therapy   Number Minutes Moist Heat  15 Minutes    Moist Heat Location  Hip right      Manual Therapy   Manual Therapy  Soft tissue mobilization;Myofascial release    Manual therapy comments  skilled palpation with TPDN    Soft tissue mobilization  right gluteals and piriformis, sacral scrubbing with IASTM tool Lumbar, sacral right paraspinals    Myofascial Release  right Quadratus Lumborum       Trigger Point Dry Needling - 05/02/18 0903    Consent Given?  Yes    Education Handout Provided  -- previously given    Muscles Treated Upper Body  Quadratus Lumborum right side only r L-3 to L-5    Muscles Treated Lower Body  Gluteus minimus;Gluteus maximus;Piriformis    Gluteus Maximus Response  Twitch response elicited;Palpable increased muscle length    Gluteus Minimus Response  Twitch  response elicited;Palpable increased muscle length    Piriformis Response  Twitch response elicited;Palpable increased muscle length           PT Education - 05/02/18 0853    Education Details  pre pilates HEP for lumbar/sacral stabilization attempted prone press up for centralizing of pain in right anterior thign    Person(s) Educated  Patient    Methods  Explanation;Demonstration;Tactile cues;Handout;Verbal cues    Comprehension  Verbalized understanding;Returned demonstration          PT Long Term Goals - 05/02/18 0853      PT LONG TERM GOAL #1   Title  Pt will be able to improve her FOTO score to 30% or less to demo improvement     Time  6    Period  Weeks    Status  Unable to assess      PT LONG TERM GOAL #2   Title  Pt will be able to stand for 2 hours at work with pain <2/10 for radiation procedure.    Baseline  Today is 7/10 pain    Status  On-going      PT LONG TERM GOAL #3   Title  Pt will report pain becoming more minimal, intermittent for ADLs, mobility     Baseline  No difference in pain with ADL and still have pain    Time  6    Period  Weeks    Status  On-going      PT LONG TERM GOAL #4   Title  Pt will be able to squat with proper form, no pain for lifting reinforcement.     Baseline  Pt tends to hyperextend with squat but responds to verbal cues    Time  6    Period  Weeks    Status  On-going      PT LONG TERM GOAL #5   Title  Pt will be I with HEP for core, posture     Baseline  Pt given initial HEP    Time  6    Period  Weeks    Status  On-going  Plan - 05/02/18 0928    Clinical Impression Statement  Pt reports getting x ray on lumbar spine due to radiating pain from lumbar to anterior thigh.  Pt given basic TrAb activation exercises for home use. Pt reports 7/10 pt at beginning and 5/10 after RX. Pt with multiple localized twtitch response on right Quadratus and gluteals.  Pt with palpable lengthening at end of session and  was closely monitored throughout session.  Pt to utliize HEP for pain relief at home and will continue Pilates with Idalia NeedleJen Paa for core strengthening. No goals achieved today because of pain exacerbation    Rehab Potential  Excellent    PT Frequency  2x / week    PT Duration  6 weeks    PT Treatment/Interventions  ADLs/Self Care Home Management;Electrical Stimulation;Functional mobility training;Neuromuscular re-education;Taping;Therapeutic activities;Iontophoresis 4mg /ml Dexamethasone;Cryotherapy;Ultrasound;Moist Heat;Therapeutic exercise;Patient/family education;Manual techniques;Passive range of motion;Stair training;Dry needling    PT Next Visit Plan   Progress hip and core, check SIJ alignment., continue Pilates see what x ray reveals on lumbar spine    PT Home Exercise Plan  clam, reverse clam and hooklying blue band clam bridge, hip abd pre pilates bridge with ball    Consulted and Agree with Plan of Care  Patient       Patient will benefit from skilled therapeutic intervention in order to improve the following deficits and impairments:  Pain, Postural dysfunction, Increased fascial restricitons, Hypermobility, Impaired flexibility, Decreased strength, Decreased range of motion, Decreased mobility  Visit Diagnosis: Hypermobility syndrome  Pain in right hip  Radiculopathy, lumbosacral region     Problem List Patient Active Problem List   Diagnosis Date Noted  . Greater trochanteric bursitis of right hip 02/21/2018  . 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 Fibromyalgia 09/02/2017  . Chronic pain syndrome 07/06/2017  . Polyarthralgia 03/31/2017  . Fatigue 02/07/2017  . Routine general medical examination at a health care facility 02/11/2015  . PP SVD 12/02/11 35wks M 12/03/2011  . POLYCYSTIC OVARIAN DISEASE 01/11/2011  . Anxiety and depression 01/06/2010  . ALLERGIC RHINITIS 01/06/2010    Garen LahLawrie Kade Rickels, PT Certified Exercise Expert for the Aging Adult  05/02/18 9:32  AM Phone: [phone] Fax: [phone]  [facility] [address] [place], [place], [address] Phone: [phone]   Fax:  [phone]  Name: [name] MRN: [id number] Date of Birth: April 29, 1981

## 2018-05-02 NOTE — Patient Instructions (Addendum)
   PELVIC TILT  Lie on back, legs bent. Exhale, tilting top of pelvis back, pubic bone up, to flatten lower back. Inhale, rolling pelvis opposite way, top forward, pubic bone down, arch in back. Repeat __10__ times. Do __2__ sessions per day. Copyright  VHI. All rights reserved.    Isometric Hold With Pelvic Floor (Hook-Lying)  Lie with hips and knees bent. Slowly inhale, and then exhale. Pull navel toward spine and tighten pelvic floor. Hold for __10_ seconds. Continue to breathe in and out during hold. Rest for _10__ seconds. Repeat __10_ times. Do __2-3_ times a day.   Knee Fold  Lie on back, legs bent, arms by sides. Exhale, lifting knee to chest. Inhale, returning. Keep abdominals flat, navel to spine. Repeat __10__ times, alternating legs. Do __2__ sessions per day.  Knee Drop  Keep pelvis stable. Without rotating hips, slowly drop knee to side, pause, return to center, bring knee across midline toward opposite hip. Feel obliques engaging. Repeat for ___10_ times each leg.   Bridge    Lie back, legs bent. Inhale, pressing hips up. Keeping ribs in, lengthen lower back. Exhale, rolling down along spine from top. Repeat __2 x 10__ times. Do1-2 ____ sessions per day.  [email or web address].[email or web address]  Angela [name], Angela [name] Certified Exercise Expert for the Aging Adult  05/02/18 8:57 AM Phone: [phone] Fax: [phone]         Copyright  VHI. All rights reserved.  Angela [name], Angela [name] Certified Exercise Expert for the Aging Adult  05/02/18 8:53 AM Phone: [phone] Fax: [phone]

## 2018-05-03 LAB — URINE CULTURE
MICRO NUMBER:: 90781393
SPECIMEN QUALITY:: ADEQUATE

## 2018-05-09 ENCOUNTER — Ambulatory Visit: Payer: No Typology Code available for payment source | Admitting: Physical Therapy

## 2018-05-09 ENCOUNTER — Encounter: Payer: Self-pay | Admitting: Physical Therapy

## 2018-05-09 DIAGNOSIS — M357 Hypermobility syndrome: Secondary | ICD-10-CM

## 2018-05-09 DIAGNOSIS — M5417 Radiculopathy, lumbosacral region: Secondary | ICD-10-CM

## 2018-05-09 DIAGNOSIS — M25551 Pain in right hip: Secondary | ICD-10-CM

## 2018-05-09 NOTE — Therapy (Signed)
[facility] [place], [place], [address] Phone: [phone]   Fax:  [phone]  Physical Therapy Treatment  Patient Details  Name: Angela DillonHeather L [name] MRN: [id number] Date of Birth: Jan 12, 1981 Referring Provider:  Gelene MinkAshleigh Shamblay, NP    Encounter Date: 05/09/2018  PT End of Session - 05/09/18 0930    Visit Number  6    Number of Visits  12    Date for PT Re-Evaluation  05/29/18    PT Start Time  0844    PT Stop Time  0943    PT Time Calculation (min)  59 min    Activity Tolerance  Patient tolerated treatment well    Behavior During Therapy  [facility] for tasks assessed/performed       Past Medical History:  Diagnosis Date  . Abnormal Pap smear    repeat ok  . Fibromyalgia 09/02/2017  . Herpes zoster   . Panic attacks   . PCOS (polycystic ovarian syndrome)    conceived on metformin and clomid  . PP SVD 12/02/11 M 12/03/2011    Past Surgical History:  Procedure Laterality Date  . BREAST BIOPSY    . WISDOM TOOTH EXTRACTION      There were no vitals filed for this visit.  Subjective Assessment - 05/09/18 0847    Subjective  I feel that the TPDN is doing the best.  I really had a hard weekend.  and I am frustrated with my practitioner.     Pertinent History  Diagnosed with fibromyalgia prior to her hypermobility diagnosis , polyarthralgia     Limitations  Sitting;Lifting;Standing;Walking;House hold activities    Patient Stated Goals  Patient would like to have pain relief, take less meds. Strengthening.     Currently in Pain?  Yes    Pain Score  6     Pain Location  Hip    Pain Orientation  Right    Pain Descriptors / Indicators  Nagging    Pain Type  Chronic pain    Pain Onset  More than a month ago    Pain Frequency  Constant                       [facility] Adult PT Treatment/Exercise - 05/09/18 0852      Self-Care   Self-Care  Other Self-Care Comments    Other Self-Care Comments   discussed  and gave handouts on fibromyalgia and hypermobility and discussed concerns and energy conservation      Lumbar Exercises: Supine   Ab Set  15 reps    AB Set Limitations  with ball under sacrum    Bent Knee Raise  15 reps ball under sacrum    Bridge  15 reps;3 seconds 2 x with ball squeeze and with ball under sacrum as well      Knee/Hip Exercises: Standing   Other Standing Knee Exercises  standing with knee into ball on wall to activate bil hip abductors 10x 2 right and left      Manual Therapy   Manual Therapy  Soft tissue mobilization;Myofascial release    Manual therapy comments  skilled palpation with TPDN    Soft tissue mobilization  right gluteals and piriformis, sacral scrubbing with IASTM tool Lumbar, sacral right paraspinals    Myofascial Release  right Quadratus Lumborum    Kinesiotex  Facilitate Muscle in childs pose with x and horiz i       Trigger Point Dry  Needling - 05/09/18 0910    Consent Given?  Yes    Education Handout Provided  No previously given    Muscles Treated Upper Body  Quadratus Lumborum right only all TPDN along right Sacral border too    Gluteus Maximus Response  Twitch response elicited;Palpable increased muscle length    Gluteus Minimus Response  Twitch response elicited;Palpable increased muscle length    Piriformis Response  Twitch response elicited;Palpable increased muscle length           PT Education - 05/09/18 0907    Education Details  Added to HEP standing hip abduction and goblet squat and added a ball under sacrum for supine exercises , Infor on fibromyalgia and hypermobiity    Person(s) Educated  Patient    Methods  Explanation;Demonstration;Tactile cues;Verbal cues    Comprehension  Verbalized understanding;Returned demonstration          PT Long Term Goals - 05/02/18 0853      PT LONG TERM GOAL #1   Title  Pt will be able to improve her FOTO score to 30% or less to demo improvement     Time  6    Period  Weeks    Status   Unable to assess      PT LONG TERM GOAL #2   Title  Pt will be able to stand for 2 hours at work with pain <2/10 for radiation procedure.    Baseline  Today is 7/10 pain    Status  On-going      PT LONG TERM GOAL #3   Title  Pt will report pain becoming more minimal, intermittent for ADLs, mobility     Baseline  No difference in pain with ADL and still have pain    Time  6    Period  Weeks    Status  On-going      PT LONG TERM GOAL #4   Title  Pt will be able to squat with proper form, no pain for lifting reinforcement.     Baseline  Pt tends to hyperextend with squat but responds to verbal cues    Time  6    Period  Weeks    Status  On-going      PT LONG TERM GOAL #5   Title  Pt will be I with HEP for core, posture     Baseline  Pt given initial HEP    Time  6    Period  Weeks    Status  On-going            Plan - 05/09/18 1118    Clinical Impression Statement  PT reports having a rough weekend.  and wondering about fibromyalgia and Hypermobiity issue.  Pt given handouts for information on both to read and discussed briefly energy conservation measures when having a flare.  Pt educated on necessity of strengthening for hypermobility issues.  and added to HEP.  Pt consented to TPDN which has helped her mangage pain and was closely monitored throughout session.  Will continue with Idalia Needle with Pilates.    Rehab Potential  Excellent    PT Frequency  2x / week    PT Duration  6 weeks    PT Treatment/Interventions  ADLs/Self Care Home Management;Electrical Stimulation;Functional mobility training;Neuromuscular re-education;Taping;Therapeutic activities;Iontophoresis 4mg /ml Dexamethasone;Cryotherapy;Ultrasound;Moist Heat;Therapeutic exercise;Patient/family education;Manual techniques;Passive range of motion;Stair training;Dry needling    PT Next Visit Plan  check SIJ alignment., continue Pilates see what x ray reveals on lumbar spine  Assess goals next visit    PT Home Exercise  Plan  clam, reverse clam and hooklying blue band clam bridge, hip abd pre pilates bridge with ball standing hip abd ball on wall and goblet squates    Consulted and Agree with Plan of Care  Patient       Patient will benefit from skilled therapeutic intervention in order to improve the following deficits and impairments:  Pain, Postural dysfunction, Increased fascial restricitons, Hypermobility, Impaired flexibility, Decreased strength, Decreased range of motion, Decreased mobility  Visit Diagnosis: Hypermobility syndrome  Pain in right hip  Radiculopathy, lumbosacral region     Problem List Patient Active Problem List   Diagnosis Date Noted  . Greater trochanteric bursitis of right hip 02/21/2018  . 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 Fibromyalgia 09/02/2017  . Chronic pain syndrome 07/06/2017  . Polyarthralgia 03/31/2017  . Fatigue 02/07/2017  . Routine general medical examination at a health care facility 02/11/2015  . PP SVD 12/02/11 35wks M 12/03/2011  . POLYCYSTIC OVARIAN DISEASE 01/11/2011  . Anxiety and depression 01/06/2010  . ALLERGIC RHINITIS 01/06/2010   Garen Lah, PT Certified Exercise Expert for the Aging Adult  05/09/18 11:22 AM Phone: [phone] Fax: [phone]  [facility] [address] [place], [place], [address] Phone: [phone]   Fax:  [phone]  Name: Angela [name] MRN: [id number] Date of Birth: 1981/06/02

## 2018-05-09 NOTE — Patient Instructions (Signed)
SINGLE LIMB STANCE   As shown in clinic stand on one leg and place knee into wall with ball and press 10 x 2 for right and left.     Garen LahLawrie Brendalyn Vallely, PT Certified Exercise Expert for the Aging Adult  05/09/18 9:06 AM Phone: [phone] Fax: [phone]

## 2018-05-12 ENCOUNTER — Ambulatory Visit: Payer: No Typology Code available for payment source | Admitting: Physical Therapy

## 2018-05-15 ENCOUNTER — Ambulatory Visit: Payer: No Typology Code available for payment source | Admitting: Physical Therapy

## 2018-05-15 ENCOUNTER — Encounter: Payer: Self-pay | Admitting: Physical Therapy

## 2018-05-18 ENCOUNTER — Encounter: Payer: Self-pay | Admitting: Physical Therapy

## 2018-05-18 ENCOUNTER — Other Ambulatory Visit: Payer: Self-pay

## 2018-05-18 ENCOUNTER — Ambulatory Visit: Payer: No Typology Code available for payment source | Admitting: Physical Therapy

## 2018-05-18 DIAGNOSIS — M25551 Pain in right hip: Secondary | ICD-10-CM

## 2018-05-18 DIAGNOSIS — M357 Hypermobility syndrome: Secondary | ICD-10-CM

## 2018-05-18 DIAGNOSIS — M5417 Radiculopathy, lumbosacral region: Secondary | ICD-10-CM

## 2018-05-18 NOTE — Therapy (Signed)
[facility] [address] [place], [place], [address] Phone: [phone]   Fax:  [phone]  Physical Therapy Treatment  Patient Details  Name: Angela DillonHeather L [name] MRN: [id number] Date of Birth: 11/29/1980 Referring Provider:  Gelene MinkAshleigh Shamblay, NP    Encounter Date: 05/18/2018  PT End of Session - 05/18/18 1025    Visit Number  7    Number of Visits  12    Date for PT Re-Evaluation  05/29/18    PT Start Time  1015    PT Stop Time  1115    PT Time Calculation (min)  60 min    Activity Tolerance  Patient tolerated treatment well    Behavior During Therapy  [facility] for tasks assessed/performed       Past Medical History:  Diagnosis Date  . Abnormal Pap smear    repeat ok  . Fibromyalgia 09/02/2017  . Herpes zoster   . Panic attacks   . PCOS (polycystic ovarian syndrome)    conceived on metformin and clomid  . PP SVD 12/02/11 M 12/03/2011    Past Surgical History:  Procedure Laterality Date  . BREAST BIOPSY    . WISDOM TOOTH EXTRACTION      There were no vitals filed for this visit.  Subjective Assessment - 05/18/18 1019    Subjective  I was not able to do Pilates with Idalia NeedleJen Paa because my son had his teeth knocked out summer camp.  TPDN is doing great.  Today I am having a lot of lower rib pain today.     Pertinent History  Diagnosed with fibromyalgia prior to her hypermobility diagnosis , polyarthralgia     Limitations  Sitting;Lifting;Standing;Walking;House hold activities    How long can you sit comfortably?  I never sit  but I can do it 30 minutes now comfortably    How long can you stand comfortably?  fatigues but I am able to stand with even wt . 15 to 30 minutes wt shifting    How long can you walk comfortably?  walking is better unless walking up hill    Diagnostic tests  none     Patient Stated Goals  Patient would like to have pain relief, take less meds. Strengthening.     Pain Score  4     Pain Location  Hip     Pain Orientation  Right    Pain Descriptors / Indicators  Nagging    Pain Type  Chronic pain    Pain Onset  More than a month ago    Pain Frequency  Constant         [facility] PT Assessment - 05/18/18 0001      Observation/Other Assessments   Focus on Therapeutic Outcomes (FOTO)   43% pt 2 % worse than eval but not reflective ofpt experience                   [facility] Adult PT Treatment/Exercise - 05/18/18 1044      Self-Care   Self-Care  Other Self-Care Comments    Other Self-Care Comments   continued discusssion about hypermobility and importance of strengthening. discussed use of self myofascial release of right psoas with use of tennis ball and return demo      Moist Heat Therapy   Number Minutes Moist Heat  15 Minutes    Moist Heat Location  Hip;Lumbar Spine      Manual Therapy   Manual Therapy  Soft tissue mobilization;Myofascial  release    Manual therapy comments  skilled palpation with TPDN    Soft tissue mobilization  right gluteals and piriformis, sacral scrubbing with IASTM tool and bil QL Lumbar, sacral right paraspinals    Myofascial Release  right Quadratus Lumborum, right psoas    Kinesiotex  Facilitate Muscle in childs pose with x and horiz i       Trigger Point Dry Needling - 05/18/18 1031    Consent Given?  Yes    Education Handout Provided  No previously given    Muscles Treated Upper Body  Quadratus Lumborum QL bil all else right side erector spinae, T-11 to S-1 R    Gluteus Maximus Response  Twitch response elicited;Palpable increased muscle length    Gluteus Minimus Response  Twitch response elicited;Palpable increased muscle length    Piriformis Response  Twitch response elicited;Palpable increased muscle length           PT Education - 05/18/18 1112    Education Details  Discussed Hypermobility and handout given at previous visit.  Self myofascial release of psoas with tennis balland demo    Person(s) Educated  Patient    Methods   Explanation;Demonstration    Comprehension  Verbalized understanding;Returned demonstration          PT Long Term Goals - 05/18/18 1024      PT LONG TERM GOAL #1   Title  Pt will be able to improve her FOTO score to 30% or less to demo improvement     Baseline  Pt was 43% limitation today 05-18-18 2% worse than eval but not worse according to pt reports    Time  6    Period  Weeks    Status  On-going      PT LONG TERM GOAL #2   Title  Pt will be able to stand for 2 hours at work with pain <2/10 for radiation procedure.    Baseline  standing 3-4/10 constant after 1 hour 05-18-18    Time  6    Period  Weeks    Status  On-going      PT LONG TERM GOAL #3   Title  Pt will report pain becoming more minimal, intermittent for ADLs, mobility     Baseline  Pt reports 50% better than on eval    Time  6    Period  Weeks    Status  On-going      PT LONG TERM GOAL #4   Title  Pt will be able to squat with proper form, no pain for lifting reinforcement.     Baseline  Pt needs verbal cues Pt tends to hyperextend with squat and needs strengthening    Time  6    Period  Weeks    Status  On-going      PT LONG TERM GOAL #5   Title  Pt will be I with HEP for core, posture     Baseline  Pt advancing HEP and will use remaining visit with jen Paa on Pilates    Time  6    Period  Weeks    Status  On-going            Plan - 05/18/18 1115    Clinical Impression Statement  Pt reports 4/10 pain and also complaind of lower rib pain which was acutally upper Quadratus lumborum bil.  Pt/PT discussed handout information on hypermobility and importance of using strengthening to improve pain sensation and perception. Marland Kitchen  Pt also given tennis ball for tool to use for self myofascial release.  Pt seems to always improve pain sensation post Dry needling and taping.  Pt also reports 2/10 pain after RX    Rehab Potential  Excellent    PT Frequency  2x / week    PT Treatment/Interventions  ADLs/Self Care  Home Management;Electrical Stimulation;Functional mobility training;Neuromuscular re-education;Taping;Therapeutic activities;Iontophoresis 4mg /ml Dexamethasone;Cryotherapy;Ultrasound;Moist Heat;Therapeutic exercise;Patient/family education;Manual techniques;Passive range of motion;Stair training;Dry needling    PT Next Visit Plan  check SIJ alignment., continue Pilates see what x ray reveals on lumbar spine  add strengthening    PT Home Exercise Plan  clam, reverse clam and hooklying blue band clam bridge, hip abd pre pilates bridge with ball standing hip abd ball on wall and goblet squates    Consulted and Agree with Plan of Care  Patient       Patient will benefit from skilled therapeutic intervention in order to improve the following deficits and impairments:  Pain, Postural dysfunction, Increased fascial restricitons, Hypermobility, Impaired flexibility, Decreased strength, Decreased range of motion, Decreased mobility  Visit Diagnosis: Hypermobility syndrome  Pain in right hip  Radiculopathy, lumbosacral region     Problem List Patient Active Problem List   Diagnosis Date Noted  . Greater trochanteric bursitis of right hip 02/21/2018  . 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 Fibromyalgia 09/02/2017  . Chronic pain syndrome 07/06/2017  . Polyarthralgia 03/31/2017  . Fatigue 02/07/2017  . Routine general medical examination at a health care facility 02/11/2015  . PP SVD 12/02/11 35wks M 12/03/2011  . POLYCYSTIC OVARIAN DISEASE 01/11/2011  . Anxiety and depression 01/06/2010  . ALLERGIC RHINITIS 01/06/2010   Garen Lah, PT Certified Exercise Expert for the Aging Adult  05/18/18 11:19 AM Phone: [phone] Fax: [phone]  [facility] [address] [place], [place], [address] Phone: [phone]   Fax:  [phone]  Name: Angela [name] MRN: [id number] Date of Birth: [birth year]

## 2018-05-22 MED FILL — LYRICA 75 MG CAPSULE: 75 | 30 days supply | Qty: 60 | Fill #2

## 2018-05-23 ENCOUNTER — Encounter: Payer: Self-pay | Admitting: Physical Therapy

## 2018-05-23 ENCOUNTER — Ambulatory Visit: Payer: No Typology Code available for payment source | Admitting: Physical Therapy

## 2018-05-23 DIAGNOSIS — M5417 Radiculopathy, lumbosacral region: Secondary | ICD-10-CM

## 2018-05-23 DIAGNOSIS — M357 Hypermobility syndrome: Secondary | ICD-10-CM

## 2018-05-23 DIAGNOSIS — M25551 Pain in right hip: Secondary | ICD-10-CM

## 2018-05-23 NOTE — Therapy (Signed)
[facility] [address] [place], [place], [address] Phone: [phone]   Fax:  [phone]  Physical Therapy Treatment  Patient Details  Name: Angela [name] MRN: [id number] Date of Birth: [birth year] Referring Provider:  Gelene Mink, NP    Encounter Date: 05/23/2018  PT End of Session - 05/23/18 1553    Visit Number  8    Number of Visits  12    Date for PT Re-Evaluation  05/29/18    Authorization Type  [place] focus    PT Start Time  1550    PT Stop Time  1643    PT Time Calculation (min)  53 min    Activity Tolerance  Patient tolerated treatment well    Behavior During Therapy  [facility] for tasks assessed/performed       Past Medical History:  Diagnosis Date  . Abnormal Pap smear    repeat ok  . Fibromyalgia 09/02/2017  . Herpes zoster   . Panic attacks   . PCOS (polycystic ovarian syndrome)    conceived on metformin and clomid  . PP SVD 12/02/11 M 12/03/2011    Past Surgical History:  Procedure Laterality Date  . BREAST BIOPSY    . WISDOM TOOTH EXTRACTION      There were no vitals filed for this visit.  Subjective Assessment - 05/23/18 1556    Subjective  I had a residual tingling sensation from the piriformis last time.  I have been using the tennis ball on the iliopsoas on right side and I think it feels better now. I just got off of work today. My right QL feels so much better since you did TPDAN on it.    Pertinent History  Diagnosed with fibromyalgia prior to her hypermobility diagnosis , polyarthralgia     Limitations  Sitting;Lifting;Standing;Walking;House hold activities    How long can you sit comfortably?  I never sit  but I can do it 30 minutes now comfortably    How long can you stand comfortably?  Stand for 1 hour when shifting weight.    How long can you walk comfortably?  walking is better unless walking up hill    Diagnostic tests  none     Patient Stated Goals  Patient would like to have  pain relief, take less meds. Strengthening.     Currently in Pain?  Yes    Pain Score  4     Pain Location  Hip    Pain Orientation  Right    Pain Descriptors / Indicators  Nagging    Pain Type  Chronic pain    Pain Onset  More than a month ago    Pain Frequency  Constant                       [facility] Adult PT Treatment/Exercise - 05/23/18 1559      Lumbar Exercises: Stretches   Quadruped Mid Back Stretch  4 reps;20 seconds to 30 seconds    Quadruped Mid Back Stretch Limitations  2 forward and 2 to left to stretch right side       Knee/Hip Exercises: Prone   Other Prone Exercises  physioball Y and T and bil extession over the  physioball 20 x each.  on physio ball unilatearl leg ext x 20 right and then left.       Moist Heat Therapy   Number Minutes Moist Heat  15 Minutes    Moist  Heat Location  Hip;Lumbar Spine      Manual Therapy   Manual Therapy  Soft tissue mobilization;Myofascial release    Manual therapy comments  skilled palpation with TPDN    Soft tissue mobilization  right gluteals and piriformis, sacral scrubbing with IASTM tool  right lumbar erector spinae Lumbar, sacral right paraspinals    Myofascial Release  Gluteals right     McConnell  SI Stability tape with pre tape and  leuko tape in x over SI        Trigger Point Dry Needling - 05/23/18 1635    Consent Given?  Yes    Education Handout Provided  No previously given    Muscles Treated Upper Body  Longissimus only right side    Longissimus Response  Twitch response elicited;Palpable increased muscle length Erector spinae L2 to Sacral right    Gluteus Maximus Response  Twitch response elicited;Palpable increased muscle length    Gluteus Minimus Response  Twitch response elicited;Palpable increased muscle length    Piriformis Response  Twitch response elicited;Palpable increased muscle length                PT Long Term Goals - 05/23/18 1636      PT LONG TERM GOAL #1   Title  Pt will  be able to improve her FOTO score to 30% or less to demo improvement     Time  6    Period  Weeks    Status  On-going      PT LONG TERM GOAL #2   Title  Pt will be able to stand for 2 hours at work with pain <2/10 for radiation procedure.    Baseline  standing 3-4/10 constant after 1 hour 05-23-18    Time  6    Period  Weeks    Status  On-going      PT LONG TERM GOAL #3   Title  Pt will report pain becoming more minimal, intermittent for ADLs, mobility     Baseline  Pt reports 50% better than on eval    Time  6    Period  Weeks    Status  On-going      PT LONG TERM GOAL #4   Title  Pt will be able to squat with proper form, no pain for lifting reinforcement.     Baseline  Pt needs verbal cues Pt tends to hyperextend with squat and needs strengthening    Time  6    Period  Weeks    Status  On-going      PT LONG TERM GOAL #5   Title  Pt will be I with HEP for core, posture     Baseline  Pt advancing HEP and will use remaining visit with jen Paa on Pilates    Time  6    Period  Weeks    Status  On-going            Plan - 05/23/18 1638    Clinical Impression Statement  Pt reports 4/10 pain in right sacral and gluteal region.  She still has a tingling sensation from last TPDN session but tissues seem less tense than before.  Rib pain from before is alleviated.  remaining right gluteal pain is bothersome after 1 hour of standing with shifting and 30 mintues of sitting.  Pt was explained importance of exercies and will use remaining visits with strengthening with Pilates and Idalia Needle. Pt consensted this time for TPDN and  was closely moniotored thorughout session.  Pt pain reduced from eval and different pain areas have been alleviated but main sacral and gluteal pain remain.  No goals achieved but pt will continue to advance HEP for home use next 3 visits.  Pt reports pain 3/10 at end of session but says pain usually increases as she is standing up and doing work.    Rehab Potential   Excellent    PT Frequency  2x / week    PT Duration  6 weeks    PT Treatment/Interventions  ADLs/Self Care Home Management;Electrical Stimulation;Functional mobility training;Neuromuscular re-education;Taping;Therapeutic activities;Iontophoresis 4mg /ml Dexamethasone;Cryotherapy;Ultrasound;Moist Heat;Therapeutic exercise;Patient/family education;Manual techniques;Passive range of motion;Stair training;Dry needling    PT Next Visit Plan  check SIJ alignment., continue Pilates  add strengthening to HEP    PT Home Exercise Plan  clam, reverse clam and hooklying blue band clam bridge, hip abd pre pilates bridge with ball standing hip abd ball on wall and goblet squates    Consulted and Agree with Plan of Care  Patient       Patient will benefit from skilled therapeutic intervention in order to improve the following deficits and impairments:  Pain, Postural dysfunction, Increased fascial restricitons, Hypermobility, Impaired flexibility, Decreased strength, Decreased range of motion, Decreased mobility  Visit Diagnosis: Hypermobility syndrome  Pain in right hip  Radiculopathy, lumbosacral region     Problem List Patient Active Problem List   Diagnosis Date Noted  . Greater trochanteric bursitis of right hip 02/21/2018  . 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 Fibromyalgia 09/02/2017  . Chronic pain syndrome 07/06/2017  . Polyarthralgia 03/31/2017  . Fatigue 02/07/2017  . Routine general medical examination at a health care facility 02/11/2015  . PP SVD 12/02/11 35wks M 12/03/2011  . POLYCYSTIC OVARIAN DISEASE 01/11/2011  . Anxiety and depression 01/06/2010  . ALLERGIC RHINITIS 01/06/2010   Garen LahLawrie Lajeana Strough, PT Certified Exercise Expert for the Aging Adult  05/23/18 4:48 PM Phone: [phone] Fax: [phone]  [facility] [address] [place], [place], [address] Phone: [phone]   Fax:  [phone]  Name: Ricard DillonHeather L  Kam MRN: [id number] Date of Birth: 02-Nov-1980

## 2018-05-25 ENCOUNTER — Ambulatory Visit: Payer: No Typology Code available for payment source | Admitting: Physical Therapy

## 2018-05-25 ENCOUNTER — Encounter: Payer: Self-pay | Admitting: Physical Therapy

## 2018-05-25 DIAGNOSIS — M357 Hypermobility syndrome: Secondary | ICD-10-CM | POA: Diagnosis not present

## 2018-05-25 DIAGNOSIS — M5417 Radiculopathy, lumbosacral region: Secondary | ICD-10-CM

## 2018-05-25 DIAGNOSIS — M25551 Pain in right hip: Secondary | ICD-10-CM

## 2018-05-25 NOTE — Therapy (Signed)
[facility] [address] [place], [place], [address] Phone: [phone]   Fax:  [phone]  Physical Therapy Treatment/Renewal  Patient Details  Name: Angela DillonHeather L [name] MRN: [id number] Date of Birth: Apr 11, 1981 Referring Provider:  Gelene MinkAshleigh Shamblay, NP    Encounter Date: 05/25/2018  PT End of Session - 05/25/18 1026    Visit Number  9    Number of Visits  17    Date for PT Re-Evaluation  06/22/18    PT Start Time  1013    PT Stop Time  1106    PT Time Calculation (min)  53 min    Activity Tolerance  Patient tolerated treatment well    Behavior During Therapy  [facility] for tasks assessed/performed       Past Medical History:  Diagnosis Date  . Abnormal Pap smear    repeat ok  . Fibromyalgia 09/02/2017  . Herpes zoster   . Panic attacks   . PCOS (polycystic ovarian syndrome)    conceived on metformin and clomid  . PP SVD 12/02/11 M 12/03/2011    Past Surgical History:  Procedure Laterality Date  . BREAST BIOPSY    . WISDOM TOOTH EXTRACTION      There were no vitals filed for this visit.  Subjective Assessment - 05/25/18 1014    Subjective  The pain is constant but does have times when it is tolerable at work.  Still gets occasional shooting pain in L thigh     Currently in Pain?  Yes    Pain Score  4     Pain Location  Hip    Pain Orientation  Right    Pain Descriptors / Indicators  Nagging;Shooting;Tingling    Pain Type  Chronic pain    Pain Onset  More than a month ago    Pain Frequency  Constant    Aggravating Factors   sitting, standing, walking     Pain Relieving Factors  stretching, ibuprofen, heat          [facility] PT Assessment - 05/25/18 0001      Strength   Right Hip Flexion  5/5    Right Hip Extension  5/5    Right Hip ABduction  5/5    Left Hip Flexion  4+/5    Left Hip Extension  4+/5    Left Hip ABduction  4+/5      Flexibility   Hamstrings  Rt 24 deg, Lt 30 deg        Pilates Reformer used  for LE/core strength, postural strength, lumbopelvic disassociation and core control.  Exercises included:  Footwork 2 Red 1 blue parallel and turnout on heels and forefoot.  Single leg same springs, focus on alignment and pelvic stability.   Bridging  Flat back all springs with  Ball x 10   Lower ab scissors for HEP  Supine Arm work 1 Red 1 yellow Arcs x 10, added chest lift x 10 for increased challenge.  Scooter 1 red x 15 each , needed UE support and cues for maintaining neutral spine, pelvis.    PT Education - 05/25/18 1259    Education Details  lower abs     Person(s) Educated  Patient    Methods  Explanation    Comprehension  Verbalized understanding          PT Long Term Goals - 05/25/18 1300      PT LONG TERM GOAL #1   Title  Pt will be  able to improve her FOTO score to 30% or less to demo improvement     Status  On-going      PT LONG TERM GOAL #2   Title  Pt will be able to stand for 2 hours at work with pain <2/10 for radiation procedure.    Baseline  standing 3-4/10 constant after 1 hour 05-23-18    Status  On-going      PT LONG TERM GOAL #3   Title  Pt will report pain becoming more minimal, intermittent for ADLs, mobility     Baseline  Pt reports 50% better than on eval    Status  On-going      PT LONG TERM GOAL #4   Title  Pt will be able to squat with proper form, no pain for lifting reinforcement.     Status  On-going      PT LONG TERM GOAL #5   Title  Pt will be I with HEP for core, posture     Status  On-going            Plan - 05/25/18 1301    Clinical Impression Statement  Patient improving 50% per report but continues to have pain, instability in local spinal stabilizers.  She will benefit from further education and corrective exercise to see if she can have a greater functional impact.  Will focus on Pilates based exercises to accomplish goals.     PT Treatment/Interventions  ADLs/Self Care Home Management;Electrical Stimulation;Functional  mobility training;Neuromuscular re-education;Taping;Therapeutic activities;Iontophoresis 4mg /ml Dexamethasone;Cryotherapy;Ultrasound;Moist Heat;Therapeutic exercise;Patient/family education;Manual techniques;Passive range of motion;Stair training;Dry needling    PT Next Visit Plan   continue Pilates  add strengthening to HEP    PT Home Exercise Plan  clam, reverse clam and hooklying blue band clam bridge, hip abd pre pilates bridge with ball standing hip abd ball on wall and goblet squates    Consulted and Agree with Plan of Care  Patient       Patient will benefit from skilled therapeutic intervention in order to improve the following deficits and impairments:  Pain, Postural dysfunction, Increased fascial restricitons, Hypermobility, Impaired flexibility, Decreased strength, Decreased range of motion, Decreased mobility  Visit Diagnosis: Hypermobility syndrome  Pain in right hip  Radiculopathy, lumbosacral region     Problem List Patient Active Problem List   Diagnosis Date Noted  . Greater trochanteric bursitis of right hip 02/21/2018  . 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 Fibromyalgia 09/02/2017  . Chronic pain syndrome 07/06/2017  . Polyarthralgia 03/31/2017  . Fatigue 02/07/2017  . Routine general medical examination at a health care facility 02/11/2015  . PP SVD 12/02/11 35wks M 12/03/2011  . POLYCYSTIC OVARIAN DISEASE 01/11/2011  . Anxiety and depression 01/06/2010  . ALLERGIC RHINITIS 01/06/2010    Angela [name] 05/25/2018, 1:08 PM  [facility] [address] [place], [place], [address] Phone: [phone]   Fax:  [phone]  Name: Angela [name] MRN: [id number] Date of Birth: 12/22/1980   Angela [name], PT 05/25/18 1:08 PM Phone: [phone] Fax: [phone]

## 2018-05-25 NOTE — Patient Instructions (Signed)
Scissors from cabinet for lower abs (APPI)

## 2018-05-29 ENCOUNTER — Encounter: Payer: Self-pay | Admitting: Physical Therapy

## 2018-05-29 ENCOUNTER — Ambulatory Visit: Payer: No Typology Code available for payment source | Admitting: Physical Therapy

## 2018-05-29 DIAGNOSIS — M357 Hypermobility syndrome: Secondary | ICD-10-CM

## 2018-05-29 DIAGNOSIS — M5417 Radiculopathy, lumbosacral region: Secondary | ICD-10-CM

## 2018-05-29 DIAGNOSIS — M25551 Pain in right hip: Secondary | ICD-10-CM

## 2018-05-29 NOTE — Therapy (Signed)
[facility] [address] [place], [place], [address] Phone: [phone]   Fax:  [phone]  Physical Therapy Treatment  Patient Details  Name: Angela [name] MRN: [id number] Date of Birth: Dec 10, 1980 Referring Provider:  Gelene Mink, NP    Encounter Date: 05/29/2018  PT End of Session - 05/29/18 1025    Visit Number  10    Number of Visits  17    Date for PT Re-Evaluation  06/30/18    Authorization Type  [place] focus    PT Start Time  1012    PT Stop Time  1112    PT Time Calculation (min)  60 min    Activity Tolerance  Patient tolerated treatment well    Behavior During Therapy  [facility] for tasks assessed/performed       Past Medical History:  Diagnosis Date  . Abnormal Pap smear    repeat ok  . Fibromyalgia 09/02/2017  . Herpes zoster   . Panic attacks   . PCOS (polycystic ovarian syndrome)    conceived on metformin and clomid  . PP SVD 12/02/11 M 12/03/2011    Past Surgical History:  Procedure Laterality Date  . BREAST BIOPSY    . WISDOM TOOTH EXTRACTION      There were no vitals filed for this visit.  Subjective Assessment - 05/29/18 1020    Subjective  Was a 0/10 this AM which was the first time in about 2 yrs.  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      Currently in Pain?  Yes    Pain Score  3     Pain Location  Hip    Pain Orientation  Right    Pain Descriptors / Indicators  Discomfort;Aching    Pain Type  Chronic pain    Pain Onset  More than a month ago    Pain Frequency  Intermittent becoming more so             [facility] Adult PT Treatment/Exercise - 05/29/18 0001      Pilates   Pilates Tower  See note       Modalities   Modalities  Electrical Stimulation      Moist Heat Therapy   Number Minutes Moist Heat  10 Minutes    Moist Heat Location  Lumbar Spine;Hip      Electrical Stimulation   Electrical Stimulation Location  Rt glute, SIJ     Electrical Stimulation Action  IFC    Electrical Stimulation Parameters  17    Electrical Stimulation Goals  Pain       Pilates Tower for LE/Core strength, postural strength, lumbopelvic disassociation and core control.  Exercises included:  Supine Leg Springs 1 Yellow single leg and then double, arcs, squats and circles with min cueing needed.   Sidelying Leg [place] 1 yellow hip adduction, flexion, extension, squat and glute med lift x 10 each ex, each LE. Mod cues for reducing ROM and maintaining balance, core   Arm Springs 1 yellow supine double arm Arcs, T arc and added SLR, scissors from table top.  Good upper core control here.       PT Long Term Goals - 05/29/18 1035      PT LONG TERM GOAL #1   Title  Pt will be able to improve her FOTO score to 30% or less to demo improvement     Baseline  Was 2 %  worse about a week ago but functionally improving, will repeat end of episode      PT LONG TERM GOAL #2   Title  Pt will be able to stand for 2 hours at work with pain <2/10 for radiation procedure.    Baseline  improving, generally 4/10    Status  On-going      PT LONG TERM GOAL #3   Title  Pt will report pain becoming more minimal, intermittent for ADLs, mobility     Baseline  Pt reports 50% better than on eval    Status  On-going      PT LONG TERM GOAL #4   Title  Pt will be able to squat with proper form, no pain for lifting reinforcement.     Status  On-going      PT LONG TERM GOAL #5   Title  Pt will be I with HEP for core, posture     Status  On-going            Plan - 05/29/18 1105    Clinical Impression Statement  Patient able to report no pain this AM which was very unusual for her.  She then assisted a patient and felt a mild increase in pain.  Worked on maintaining core control with LE springs. and was moderately challenging.  Trial of IFC along with heat to treat post exercise soreness.      PT Treatment/Interventions  ADLs/Self Care Home  Management;Electrical Stimulation;Functional mobility training;Neuromuscular re-education;Taping;Therapeutic activities;Iontophoresis 4mg /ml Dexamethasone;Cryotherapy;Ultrasound;Moist Heat;Therapeutic exercise;Patient/family education;Manual techniques;Passive range of motion;Stair training;Dry needling    PT Next Visit Plan   continue Pilates  add strengthening to HEP    PT Home Exercise Plan  clam, reverse clam and hooklying blue band clam bridge, hip abd pre pilates bridge with ball standing hip abd ball on wall and goblet squates    Consulted and Agree with Plan of Care  Patient       Patient will benefit from skilled therapeutic intervention in order to improve the following deficits and impairments:  Pain, Postural dysfunction, Increased fascial restricitons, Hypermobility, Impaired flexibility, Decreased strength, Decreased range of motion, Decreased mobility  Visit Diagnosis: Hypermobility syndrome  Pain in right hip  Radiculopathy, lumbosacral region     Problem List Patient Active Problem List   Diagnosis Date Noted  . Greater trochanteric bursitis of right hip 02/21/2018  . 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 Fibromyalgia 09/02/2017  . Chronic pain syndrome 07/06/2017  . Polyarthralgia 03/31/2017  . Fatigue 02/07/2017  . Routine general medical examination at a health care facility 02/11/2015  . PP SVD 12/02/11 35wks M 12/03/2011  . POLYCYSTIC OVARIAN DISEASE 01/11/2011  . Anxiety and depression 01/06/2010  . ALLERGIC RHINITIS 01/06/2010    Angela [name] 05/29/2018, 11:14 AM  [facility] [address] [place], [place], [address] Phone: [phone]   Fax:  [phone]  Name: Angela DillonHeather L [name] MRN: [id number] Date of Birth: [birth year]  Angela MainlandJennifer Euphemia [name], PT 05/29/18 11:14 AM Phone: [phone] Fax: [phone]

## 2018-05-31 ENCOUNTER — Ambulatory Visit: Payer: No Typology Code available for payment source | Admitting: Physical Therapy

## 2018-05-31 ENCOUNTER — Encounter: Payer: Self-pay | Admitting: Physical Therapy

## 2018-05-31 DIAGNOSIS — M25551 Pain in right hip: Secondary | ICD-10-CM

## 2018-05-31 DIAGNOSIS — M357 Hypermobility syndrome: Secondary | ICD-10-CM

## 2018-05-31 DIAGNOSIS — M5417 Radiculopathy, lumbosacral region: Secondary | ICD-10-CM

## 2018-05-31 NOTE — Therapy (Signed)
[facility] Outpatient Rehabilitation [facility] [address] [place], [place], [address] Phone: [phone]   Fax:  [phone]  Physical Therapy Treatment  Patient Details  Name: Angela [name] MRN: [id number] Date of Birth: 1981/02/21 Referring Provider:  Gelene Mink, NP    Encounter Date: 05/31/2018  PT End of Session - 05/31/18 1005    Visit Number  11    Number of Visits  17    Date for PT Re-Evaluation  06/30/18    Authorization Type  [place] focus    PT Start Time  1010    PT Stop Time  1112    PT Time Calculation (min)  62 min    Activity Tolerance  Patient tolerated treatment well    Behavior During Therapy  [facility] for tasks assessed/performed       Past Medical History:  Diagnosis Date  . Abnormal Pap smear    repeat ok  . Fibromyalgia 09/02/2017  . Herpes zoster   . Panic attacks   . PCOS (polycystic ovarian syndrome)    conceived on metformin and clomid  . PP SVD 12/02/11 M 12/03/2011    Past Surgical History:  Procedure Laterality Date  . BREAST BIOPSY    . WISDOM TOOTH EXTRACTION      There were no vitals filed for this visit.  Subjective Assessment - 05/31/18 1010    Subjective  Recovered from her minor set back.  DId not take Ibuprofen today.  Its a 3/10.      Currently in Pain?  Yes    Pain Score  3     Pain Location  Hip    Pain Orientation  Right    Pain Descriptors / Indicators  Aching    Pain Type  Chronic pain    Pain Onset  More than a month ago    Pain Frequency  Intermittent    Aggravating Factors   sitting or standing too long    Pain Relieving Factors  stretching, ibuprofen, heat          Pilates Reformer used for LE/core strength, postural strength, lumbopelvic disassociation and core control.  Exercises included:  Footwork 2 Red 1 blue parallel and on toes, heel lifts, double leg  Single leg 2 Red 1 blue   Hip circles x 5 each leg, each direction  Bridging double leg 3 springs x 10 with ball    Elephant 1 Red and 1 Yellow  10   Plank x 15 sec  3 in upstretch    Long box Prone Pulling straps 1 red  X 10 2 sets  Prone swimming no springs UE and LE x 10 monitored lumbar extension   [facility] Adult PT Treatment/Exercise - 05/31/18 0001      Pilates   Pilates Reformer  See note       Moist Heat Therapy   Number Minutes Moist Heat  15 Minutes    Moist Heat Location  Lumbar Spine;Hip      Electrical Stimulation   Electrical Stimulation Location  Rt glute, SIJ    Electrical Stimulation Action  IFC     Electrical Stimulation Parameters  12     Electrical Stimulation Goals  Pain      Manual Therapy   Soft tissue mobilization  Rt gluteals, L and Quadratus lumborum             PT Education - 05/31/18 1059    Education Details  scoliosis, postural imbalance, Pilates Reformer  Person(s) Educated  Patient    Methods  Explanation    Comprehension  Verbalized understanding;Returned demonstration          PT Long Term Goals - 05/29/18 1035      PT LONG TERM GOAL #1   Title  Pt will be able to improve her FOTO score to 30% or less to demo improvement     Baseline  Was 2 % worse about a week ago but functionally improving, will repeat end of episode      PT LONG TERM GOAL #2   Title  Pt will be able to stand for 2 hours at work with pain <2/10 for radiation procedure.    Baseline  improving, generally 4/10    Status  On-going      PT LONG TERM GOAL #3   Title  Pt will report pain becoming more minimal, intermittent for ADLs, mobility     Baseline  Pt reports 50% better than on eval    Status  On-going      PT LONG TERM GOAL #4   Title  Pt will be able to squat with proper form, no pain for lifting reinforcement.     Status  On-going      PT LONG TERM GOAL #5   Title  Pt will be I with HEP for core, posture     Status  On-going            Plan - 05/31/18 1004    Clinical Impression Statement  Min pain today, no increase with Pilates reformer  exercises.  Tends to hyperextend L spine so we worked in prone to maintain core connection.  Noted L rib cage more anterior than R.  likely due to scoliosis.  L QL more hypertonic than Rt.  Liked IFC and may order in the future.     PT Treatment/Interventions  ADLs/Self Care Home Management;Electrical Stimulation;Functional mobility training;Neuromuscular re-education;Taping;Therapeutic activities;Iontophoresis 4mg /ml Dexamethasone;Cryotherapy;Ultrasound;Moist Heat;Therapeutic exercise;Patient/family education;Manual techniques;Passive range of motion;Stair training;Dry needling    PT Next Visit Plan   continue Pilates. manual and stabilization    PT Home Exercise Plan  clam, reverse clam and hooklying blue band clam bridge, hip abd pre pilates bridge with ball standing hip abd ball on wall and goblet squates    Consulted and Agree with Plan of Care  Patient       Patient will benefit from skilled therapeutic intervention in order to improve the following deficits and impairments:  Pain, Postural dysfunction, Increased fascial restricitons, Hypermobility, Impaired flexibility, Decreased strength, Decreased range of motion, Decreased mobility  Visit Diagnosis: Pain in right hip  Radiculopathy, lumbosacral region  Hypermobility syndrome     Problem List Patient Active Problem List   Diagnosis Date Noted  . Greater trochanteric bursitis of right hip 02/21/2018  . 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 Fibromyalgia 09/02/2017  . Chronic pain syndrome 07/06/2017  . Polyarthralgia 03/31/2017  . Fatigue 02/07/2017  . Routine general medical examination at a health care facility 02/11/2015  . PP SVD 12/02/11 35wks M 12/03/2011  . POLYCYSTIC OVARIAN DISEASE 01/11/2011  . Anxiety and depression 01/06/2010  . ALLERGIC RHINITIS 01/06/2010    Shandi Godfrey 05/31/2018, 11:03 AM  [facility] [address] [place], [place], [address] Phone:  [phone]   Fax:  [phone]  Name: Angela [name] MRN: [id number] Date of Birth: 1981/09/09  Karie MainlandJennifer Delmar Dondero, PT 05/31/18 11:04 AM Phone: [phone] Fax: [phone]

## 2018-06-06 ENCOUNTER — Encounter: Payer: Self-pay | Admitting: Nurse Practitioner

## 2018-06-12 MED FILL — SERTRALINE HCL 100 MG TAB: 100 | 90 days supply | Qty: 135 | Fill #1

## 2018-06-13 ENCOUNTER — Other Ambulatory Visit: Payer: Self-pay | Admitting: Nurse Practitioner

## 2018-06-13 ENCOUNTER — Ambulatory Visit: Payer: No Typology Code available for payment source | Attending: Nurse Practitioner | Admitting: Physical Therapy

## 2018-06-13 DIAGNOSIS — M357 Hypermobility syndrome: Secondary | ICD-10-CM

## 2018-06-13 DIAGNOSIS — M5417 Radiculopathy, lumbosacral region: Secondary | ICD-10-CM | POA: Diagnosis present

## 2018-06-13 DIAGNOSIS — M25551 Pain in right hip: Secondary | ICD-10-CM | POA: Insufficient documentation

## 2018-06-13 DIAGNOSIS — G894 Chronic pain syndrome: Secondary | ICD-10-CM

## 2018-06-13 NOTE — Therapy (Signed)
[facility] [address] [place], [place], [address] Phone: [phone]   Fax:  [phone]  Physical Therapy Treatment  Patient Details  Name: Angela DillonHeather L Bevens MRN: [id number] Date of Birth: 02/28/1981 Referring Provider:  Gelene MinkAshleigh Shamblay, NP    Encounter Date: 06/13/2018  PT End of Session - 06/13/18 1433    Visit Number  12    Number of Visits  17    Date for PT Re-Evaluation  06/30/18    Authorization Type  [place] focus    PT Start Time  1413    PT Stop Time  1503    PT Time Calculation (min)  50 min    Activity Tolerance  Patient tolerated treatment well    Behavior During Therapy  [facility] for tasks assessed/performed       Past Medical History:  Diagnosis Date  . Abnormal Pap smear    repeat ok  . Fibromyalgia 09/02/2017  . Herpes zoster   . Panic attacks   . PCOS (polycystic ovarian syndrome)    conceived on metformin and clomid  . PP SVD 12/02/11 M 12/03/2011    Past Surgical History:  Procedure Laterality Date  . BREAST BIOPSY    . WISDOM TOOTH EXTRACTION      There were no vitals filed for this visit.  Subjective Assessment - 06/13/18 1415    Subjective  Rt buttock pain, 3/10-4/10. Been doing exercises and working on my posture.     Currently in Pain?  Yes    Pain Score  4     Pain Location  Buttocks    Pain Orientation  Right    Pain Descriptors / Indicators  Aching    Pain Type  Chronic pain    Pain Onset  More than a month ago    Pain Frequency  Intermittent           [facility] Adult PT Treatment/Exercise - 06/13/18 0001      Lumbar Exercises: Stretches   Quadruped Mid Back Stretch Limitations  upper trunk rotation x 4 each side     Figure 4 Stretch  3 reps;30 seconds    Figure 4 Stretch Limitations  cues to keep neutral pelvis       Lumbar Exercises: Supine   Clam  10 reps;15 reps    Clam Limitations  foam roller unilat and bilateral     Bent Knee Raise  10 reps    Bent Knee Raise  Limitations  foam roller     Dead Bug  10 reps    Dead Bug Limitations  foam roller       Modalities   Modalities  Iontophoresis      Iontophoresis   Type of Iontophoresis  Dexamethasone    Location  Rt piriformis     Dose  1 cc     Time  6 hour patch       Manual Therapy   Manual Therapy  Muscle Energy Technique    Soft tissue mobilization  Rt gluteals , piriformis    Myofascial Release  LS spine in prone     Muscle Energy Technique  resisted Rt hip flexion to correct post rotated Rt ilium                   PT Long Term Goals - 06/13/18 1515      PT LONG TERM GOAL #1   Title  Pt will be able to improve her FOTO score  to 30% or less to demo improvement     Status  On-going      PT LONG TERM GOAL #2   Title  Pt will be able to stand for 2 hours at work with pain <2/10 for radiation procedure.    Baseline  improving    Status  On-going      PT LONG TERM GOAL #3   Title  Pt will report pain becoming more minimal, intermittent for ADLs, mobility     Baseline  Pt reports 50% better than on eval    Status  On-going      PT LONG TERM GOAL #4   Title  Pt will be able to squat with proper form, no pain for lifting reinforcement.     Baseline  Pt needs verbal cues Pt tends to hyperextend with squat and needs strengthening    Status  On-going      PT LONG TERM GOAL #5   Title  Pt will be I with HEP for core, posture     Status  On-going            Plan - 06/13/18 1516    Clinical Impression Statement  Patient did well on foam roller stability exercises, showing good control and awareness, joint proprioception.  Trial of ionto following manual to R piriformis muscle to reduce pain, she felt only min pain when she left (1/10).     PT Treatment/Interventions  ADLs/Self Care Home Management;Electrical Stimulation;Functional mobility training;Neuromuscular re-education;Taping;Therapeutic activities;Iontophoresis 4mg /ml Dexamethasone;Cryotherapy;Ultrasound;Moist  Heat;Therapeutic exercise;Patient/family education;Manual techniques;Passive range of motion;Stair training;Dry needling    PT Next Visit Plan   continue Pilates. manual and stabilization.  How was [employer]ogene Morgan[employer]ney- renew? 2 weeks until next session.     PT Home Exercise Plan  clam, reverse clam and hooklying blue band clam bridge, hip abd pre pilates bridge with ball standing hip abd ball on wall and goblet squates    Consulted and Agree with Plan of Care  Patient       Patient will benefit from skilled therapeutic intervention in order to improve the following deficits and impairments:  Pain, Postural dysfunction, Increased fascial restricitons, Hypermobility, Impaired flexibility, Decreased strength, Decreased range of motion, Decreased mobility  Visit Diagnosis: Pain in right hip  Radiculopathy, lumbosacral region  Hypermobility syndrome     Problem List Patient Active Problem List   Diagnosis Date Noted  . Greater trochanteric bursitis of right hip 02/21/2018  . 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 Fibromyalgia 09/02/2017  . Chronic pain syndrome 07/06/2017  . Polyarthralgia 03/31/2017  . Fatigue 02/07/2017  . Routine general medical examination at a health care facility 02/11/2015  . PP SVD 12/02/11 35wks M 12/03/2011  . POLYCYSTIC OVARIAN DISEASE 01/11/2011  . Anxiety and depression 01/06/2010  . ALLERGIC RHINITIS 01/06/2010    Quenisha Lovins 06/13/2018, 3:20 PM  [facility] [address] [place], [place], [address] Phone: [phone]   Fax:  [phone]  Name: Angela DillonHeather L [place] MRN: [id number] Date of Birth: Sep 24, 1981  Karie MainlandJennifer Alayja Armas, PT 06/13/18 3:21 PM Phone: [phone] Fax: [phone]

## 2018-06-14 ENCOUNTER — Other Ambulatory Visit: Payer: Self-pay | Admitting: Nurse Practitioner

## 2018-06-14 ENCOUNTER — Encounter: Payer: Self-pay | Admitting: Physical Therapy

## 2018-06-14 DIAGNOSIS — G894 Chronic pain syndrome: Secondary | ICD-10-CM

## 2018-06-14 MED ORDER — PREGABALIN 75 MG PO CAPS: ORAL_CAPSULE | ORAL | 2 refills | Status: DC

## 2018-06-14 MED FILL — PREGABALIN 75 MG CAPS: 75 | 30 days supply | Qty: 60 | Fill #0

## 2018-06-27 ENCOUNTER — Encounter: Payer: No Typology Code available for payment source | Admitting: Physical Therapy

## 2018-06-28 ENCOUNTER — Encounter: Payer: Self-pay | Admitting: Nurse Practitioner

## 2018-06-29 ENCOUNTER — Encounter: Payer: Self-pay | Admitting: Physical Therapy

## 2018-06-29 ENCOUNTER — Ambulatory Visit: Payer: No Typology Code available for payment source | Admitting: Physical Therapy

## 2018-06-29 DIAGNOSIS — M25551 Pain in right hip: Secondary | ICD-10-CM

## 2018-06-29 DIAGNOSIS — M5417 Radiculopathy, lumbosacral region: Secondary | ICD-10-CM

## 2018-06-29 DIAGNOSIS — M357 Hypermobility syndrome: Secondary | ICD-10-CM

## 2018-06-29 NOTE — Therapy (Addendum)
[place], [place], [address] Phone: [phone]   Fax:  [phone]  Physical Therapy Treatment/Discharge  Patient Details  Name: Angela [name] MRN: [id number] Date of Birth: Dec 27, 1980 Referring Provider:  Genevie Cheshire, NP    Encounter Date: 06/29/2018  PT End of Session - 06/29/18 1538    Visit Number  13    PT Start Time  1500    PT Stop Time  1536   had to leave early due to work    PT Time Calculation (min)  36 min    Activity Tolerance  Patient tolerated treatment well    Behavior During Therapy  [facility] for tasks assessed/performed       Past Medical History:  Diagnosis Date  . Abnormal Pap smear    repeat ok  . Fibromyalgia 09/02/2017  . Herpes zoster   . Panic attacks   . PCOS (polycystic ovarian syndrome)    conceived on metformin and clomid  . PP SVD 12/02/11 M 12/03/2011    Past Surgical History:  Procedure Laterality Date  . BREAST BIOPSY    . WISDOM TOOTH EXTRACTION      There were no vitals filed for this visit.  Subjective Assessment - 06/29/18 1511    Subjective  Returned from [place] did have some referred pain into Rt thigh.   Last day of PT.     Currently in Pain?  Yes    Pain Score  3     Pain Location  Buttocks    Pain Orientation  Right        [facility] Adult PT Treatment/Exercise - 06/29/18 0001      Lumbar Exercises: Stretches   Figure 4 Stretch  3 reps;30 seconds      Lumbar Exercises: Supine   Clam  10 reps    Clam Limitations  blue band     Bridge  10 reps    Bridge with [employer] Limitations  bridge and clam blue band x 10       Knee/Hip Exercises: Standing   Other Standing Knee Exercises  hip hinge , squat and sit to stand with pole to tech neutral spine, avoiding hyperextension , safety with lifting       Knee/Hip Exercises: Sidelying   Clams  x 20 blue band              PT Education - 06/29/18 1541    Education Details  hip hinge, HEP  , Pilates     Person(s) Educated  Patient    Methods  Explanation;Demonstration;Verbal cues;Handout    Comprehension  Verbalized understanding          PT Long Term Goals - 06/29/18 1520      PT LONG TERM GOAL #1   Title  Pt will be able to improve her FOTO score to 30% or less to demo improvement     Baseline  25%    Status  Achieved      PT LONG TERM GOAL #2   Title  Pt will be able to stand for 2 hours at work with pain <2/10 for radiation procedure.    Status  Achieved      PT LONG TERM GOAL #3   Title  Pt will report pain becoming more minimal, intermittent for ADLs, mobility     Status  Achieved      PT LONG TERM GOAL #4   Title  Pt will be able to  squat with proper form, no pain for lifting reinforcement.     Baseline  Pt needs verbal cues Pt tends to hyperextend with squat     Status  Partially Met      PT LONG TERM GOAL #5   Title  Pt will be I with HEP for core, posture     Status  Achieved            Plan - 06/29/18 1514    Clinical Impression Statement  Patient is ready for DC.  She cont to have mild pain in her Rt hip but cna manage on her own with her HEP and ongoing self care.  She understands that her condition is chronic and that strengthening is what she needs to maintain her mobility.  She is in the process of seeking a 2nd opinion for her joint pain, hypermobility from a Rheumatologist.      PT Next Visit Plan  DC to HEP and community based Pilates     PT Home Exercise Plan  clam, reverse clam and hooklying blue band clam bridge, hip abd, pre-pilates bridge with ball standing hip abd ball on wall and goblet squates    Consulted and Agree with Plan of Care  Patient       Patient will benefit from skilled therapeutic intervention in order to improve the following deficits and impairments:  Pain, Postural dysfunction, Increased fascial restricitons, Hypermobility, Impaired flexibility, Decreased strength, Decreased range of motion, Decreased  mobility  Visit Diagnosis: Pain in right hip  Radiculopathy, lumbosacral region  Hypermobility syndrome     Problem List Patient Active Problem List   Diagnosis Date Noted  . Greater trochanteric bursitis of right hip 02/21/2018  . 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 Fibromyalgia 09/02/2017  . Chronic pain syndrome 07/06/2017  . Polyarthralgia 03/31/2017  . Fatigue 02/07/2017  . Routine general medical examination at a health care facility 02/11/2015  . PP SVD 12/02/11 35wks M 12/03/2011  . POLYCYSTIC OVARIAN DISEASE 01/11/2011  . Anxiety and depression 01/06/2010  . ALLERGIC RHINITIS 01/06/2010    Caddie Randle 06/29/2018, 3:46 PM  [facility] [address] [place], [place], [address] Phone: [phone]   Fax:  [phone]  Name: Angela [name] MRN: [id number] Date of Birth: June 20, 1981   PHYSICAL THERAPY DISCHARGE SUMMARY  Visits from Start of Care: 13  Current functional level related to goals / functional outcomes: See above    Remaining deficits: Pain, joint hypermobility, core strength    Education / Equipment: Core, lifting, body mechanics, Pilates  Plan: Patient agrees to discharge.  Patient goals were met. Patient is being discharged due to meeting the stated rehab goals.  ?????      Raeford Razor, PT 06/29/18 3:47 PM Phone: [phone] Fax: [phone]

## 2018-07-06 ENCOUNTER — Ambulatory Visit (INDEPENDENT_AMBULATORY_CARE_PROVIDER_SITE_OTHER): Payer: No Typology Code available for payment source | Admitting: Family Medicine

## 2018-07-06 ENCOUNTER — Encounter: Payer: Self-pay | Admitting: Family Medicine

## 2018-07-06 ENCOUNTER — Ambulatory Visit (INDEPENDENT_AMBULATORY_CARE_PROVIDER_SITE_OTHER)
Admission: RE | Admit: 2018-07-06 | Discharge: 2018-07-06 | Disposition: A | Discharge status: Home / Self Care | Payer: No Typology Code available for payment source | Source: Ambulatory Visit | Attending: Family Medicine | Admitting: Family Medicine

## 2018-07-06 VITALS — BP 108/98 | HR 94 | Ht 67.0 in | Wt 140.0 lb

## 2018-07-06 DIAGNOSIS — M7918 Myalgia, other site: Secondary | ICD-10-CM

## 2018-07-06 NOTE — Progress Notes (Signed)
Angela [name] - 37 y.o. female MRN [id number]  Date of birth: June 07, 1981  SUBJECTIVE:  Including CC & ROS.  Chief Complaint  Patient presents with  . Follow-up    Angela [name] is a 37 y.o. female that is here today for right hip pain follow up. Pain is located in her right buttock. Has received a GT bursa injection with no improvement of her pain.  She completed six weeks of physical therapy, piliates and dry needling. She did receive an injection on 02/21/18 with no improvement. Pain has not improved. She cannot sit for long periods or stand. She has been completing daily exercises. She has been taking Motrin for the pain.     Review of Systems  Constitutional: Negative for fever.  HENT: Negative for congestion.   Respiratory: Negative for cough and choking.   Cardiovascular: Negative for chest pain.  Gastrointestinal: Negative for abdominal pain.  Musculoskeletal: Positive for back pain.  Skin: Negative for color change.  Neurological: Negative for weakness.  Hematological: Negative for adenopathy.  Psychiatric/Behavioral: Negative for agitation.    HISTORY: Past Medical, Surgical, Social, and Family History Reviewed & Updated per EMR.   Pertinent Historical Findings include:  Past Medical History:  Diagnosis Date  . Abnormal Pap smear    repeat ok  . Fibromyalgia 09/02/2017  . Herpes zoster   . Panic attacks   . PCOS (polycystic ovarian syndrome)    conceived on metformin and clomid  . PP SVD 12/02/11 M 12/03/2011    Past Surgical History:  Procedure Laterality Date  . BREAST BIOPSY    . WISDOM TOOTH EXTRACTION      No Known Allergies  Family History  Problem Relation Age of Onset  . Diabetes Father        uncontrolled  . Hypertension Father   . Hyperlipidemia Father   . Alcohol abuse Other   . Arthritis Other   . Ovarian cancer Paternal Grandmother   . Anesthesia problems Neg Hx      Social History   Socioeconomic History  . Marital status:  Married    Spouse name: Not on file  . Number of children: 1  . Years of education: 41  . Highest education level: Not on file  Occupational History  . Occupation: Systems developer  Social Needs  . Financial resource strain: Not on file  . Food insecurity:    Worry: Not on file    Inability: Not on file  . Transportation needs:    Medical: Not on file    Non-medical: Not on file  Tobacco Use  . Smoking status: Former Smoker    Last attempt to quit: 11/01/2009    Years since quitting: 8.6  . Smokeless tobacco: Never Used  Substance and Sexual Activity  . Alcohol use: Yes    Alcohol/week: 2.0 standard drinks    Types: 2 Cans of beer per week  . Drug use: No  . Sexual activity: Yes  Lifestyle  . Physical activity:    Days per week: Not on file    Minutes per session: Not on file  . Stress: Not on file  Relationships  . Social connections:    Talks on phone: Not on file    Gets together: Not on file    Attends religious service: Not on file    Active member of club or organization: Not on file    Attends meetings of clubs or organizations: Not on file    Relationship status:  Not on file  . Intimate partner violence:    Fear of current or ex partner: Not on file    Emotionally abused: Not on file    Physically abused: Not on file    Forced sexual activity: Not on file  Other Topics Concern  . 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    Denies any religious beliefs effecting health care.    Lives with husband and son in a 2 story home.     Education: college.      PHYSICAL EXAM:  VS: BP (!) 108/98   Pulse 94   Ht 5\' 7"  (1.702 m)   Wt 140 lb (63.5 kg)   SpO2 94%   BMI 21.93 kg/m  Physical Exam Gen: NAD, alert, cooperative with exam, well-appearing ENT: normal lips, normal nasal mucosa,  Eye: normal EOM, normal conjunctiva and lids CV:  no edema, +2 pedal pulses   Resp: no accessory muscle use,  non-labored,  Skin: no rashes, no areas of induration  Neuro: normal tone, normal sensation to touch Psych:  normal insight, alert and oriented MSK:  Right buttock  TTP of the piriformis  No TTP of the SI joints or GT  Normal IR and ER  Normal strength with hip flexion, knee flexion and extension  Negative SLR b/l  Normal FADIR and FABER  Normal back flexion and extension  Neurovascularly intact       ASSESSMENT & PLAN:   Right buttock pain Pain is possible for muscle origin but is chronic in nature. Possible for SI joint referred pain. Unlikely for piriformis with no radicular symptoms. Could be originating from ischiofemoral impingement. Has done PT with minimal improvement and tried prior GT injection with no improvement  - right hip xray  - will try SI joint injection  - if no improvement then consider MRI hip/buttock to evaluate for impingement vs labral pathology

## 2018-07-06 NOTE — Patient Instructions (Signed)
Good to see you  See [place] down at [facility] and we'll do an injection  I will call you with the results from today.

## 2018-07-07 ENCOUNTER — Ambulatory Visit (INDEPENDENT_AMBULATORY_CARE_PROVIDER_SITE_OTHER): Payer: Self-pay | Admitting: Family Medicine

## 2018-07-07 ENCOUNTER — Telehealth: Payer: Self-pay | Admitting: Family Medicine

## 2018-07-07 ENCOUNTER — Encounter: Payer: Self-pay | Admitting: Family Medicine

## 2018-07-07 VITALS — BP 112/70 | HR 97 | Temp 99.2°F | Wt 139.4 lb

## 2018-07-07 DIAGNOSIS — J40 Bronchitis, not specified as acute or chronic: Secondary | ICD-10-CM

## 2018-07-07 MED ORDER — DOXYCYCLINE HYCLATE 100 MG PO TABS: 100.0000 mg | ORAL_TABLET | Freq: Two times a day (BID) | ORAL | 0 refills | Status: DC

## 2018-07-07 NOTE — Telephone Encounter (Signed)
Left VM for patient. If she calls back please have her speak with a nurse/CMA and inform that her hip doesn't appear to have any degenerative changes. The [facility] can report results to patient.   If any questions then please take the best time and phone number to call and I will try to call her back.   Myra Rude, MD [facility] 07/07/2018, 5:37 PM

## 2018-07-07 NOTE — Assessment & Plan Note (Signed)
Pain is possible for muscle origin but is chronic in nature. Possible for SI joint referred pain. Unlikely for piriformis with no radicular symptoms. Could be originating from ischiofemoral impingement. Has done PT with minimal improvement and tried prior GT injection with no improvement  - right hip xray  - will try SI joint injection  - if no improvement then consider MRI hip/buttock to evaluate for impingement vs labral pathology

## 2018-07-07 NOTE — Progress Notes (Signed)
Angela [name] is a 37 y.o. female who presents today with concerns of cough, sore throat, hoarseness and congestion for the past 8 days. 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 She has attempted to treat this condition with various over the counter medications.  Review of Systems  Constitutional: Negative for chills, fever and malaise/fatigue.  HENT: Positive for sore throat. Negative for congestion, ear discharge, ear pain and sinus pain.   Eyes: Negative.   Respiratory: Positive for cough and sputum production. Negative for shortness of breath.   Cardiovascular: Negative.  Negative for chest pain.  Gastrointestinal: Negative for abdominal pain, diarrhea, nausea and vomiting.  Genitourinary: Negative for dysuria, frequency, hematuria and urgency.  Musculoskeletal: Negative for myalgias.  Skin: Negative.   Neurological: Negative for headaches.  Endo/Heme/Allergies: Negative.   Psychiatric/Behavioral: Negative.     O: Vitals:   07/07/18 1730  BP: 112/70  Pulse: 97  Temp: 99.2 F (37.3 C)  SpO2: 95%     Physical Exam  Constitutional: She is oriented to person, place, and time. Vital signs are normal. She appears well-developed and well-nourished. She is active.  Non-toxic appearance. She does not have a sickly appearance.  HENT:  Head: Normocephalic.  Right Ear: Hearing, tympanic membrane, external ear and ear canal normal.  Left Ear: Hearing, tympanic membrane, external ear and ear canal normal.  Nose: Nose normal.  Mouth/Throat: Uvula is midline and oropharynx is clear and moist.  Neck: Normal range of motion. Neck supple.  Cardiovascular: Normal rate, regular rhythm, normal heart sounds and normal pulses.  Pulmonary/Chest: Effort normal. She has rhonchi in the left upper field, the left middle field and the left lower field.  Abdominal: Soft. Bowel sounds are normal.  Musculoskeletal: Normal range of motion.  Lymphadenopathy:       Head (right side):  Tonsillar adenopathy present. No submental and no submandibular adenopathy present.       Head (left side): Tonsillar adenopathy present. No submental and no submandibular adenopathy present.    She has cervical adenopathy.       Right cervical: Superficial cervical adenopathy present.       Left cervical: Superficial cervical adenopathy present.  Neurological: She is alert and oriented to person, place, and time.  Psychiatric: She has a normal mood and affect.  Vitals reviewed.  A: 1. Bronchitis    P: Discussed exam findings, diagnosis etiology and medication use and indications reviewed with patient. Follow- Up and discharge instructions provided. No emergent/urgent issues found on exam.  Patient verbalized understanding of information provided and agrees with plan of care (POC), all questions answered.  Advised to return to clinic or call if symptoms are not improving- advised to increase hydration and supportive care measures to include mucinex prn.  1. Bronchitis - doxycycline (VIBRA-TABS) 100 MG tablet; Take 1 tablet (100 mg total) by mouth 2 (two) times daily.

## 2018-07-07 NOTE — Patient Instructions (Signed)

## 2018-07-10 NOTE — Telephone Encounter (Signed)
Pt was given result of hip Xray per telephone notes of Dr Jordan Likes 07/07/18. She verbalized understanding and requested result be released to My Chart.

## 2018-07-16 NOTE — Progress Notes (Signed)
Angela [name] - 37 y.o. female MRN [id number]  Date of birth: Aug 09, 1981  SUBJECTIVE:  Including CC & ROS.  Chief Complaint  Patient presents with  . Follow-up    R hip / SIJ    CHRISTEAN [name] is a 37 y.o. female that is presenting for f/u of R hip and buttock pain.  She was last seen on 07/06/18 and had a R hip XR at that time.  She states that she did not take any IBU this morning and rates her pain at a constant 5-6/10.  Symptoms are irritated w/ prolonged standing or sitting as well as w/ walking up hill.  She has completed a prior course of PT, pilates and dry needling.  She has been taking Motrin for pain and completing a daily HEP.  Pt states that she was diagnosed w/ pneumonia and bronchitis.  She states that since that dx she has been having pain in her L lower ribcage and would like to discuss this as well.  She also notes that her PCP suggested that she get tested for Ehlers-Danlos syndrome and would like to know what Dr. Jordan Likes thinks about that.  Independent review of the lumbar spine x-ray from 7/1 shows mild curvature of the lumbar spine convex left.  Independent review of the right hip x-ray from 9/5 shows minimal degenerative changes of the hip.   Review of Systems  Constitutional: Negative for fever.  HENT: Negative for congestion.   Respiratory: Negative for cough.   Cardiovascular: Negative for chest pain.  Gastrointestinal: Negative for abdominal pain.  Musculoskeletal: Negative for back pain.  Skin: Negative for color change.  Neurological: Negative for weakness.  Hematological: Negative for adenopathy.  Psychiatric/Behavioral: Negative for agitation.    HISTORY: Past Medical, Surgical, Social, and Family History Reviewed & Updated per EMR.   Pertinent Historical Findings include:  Past Medical History:  Diagnosis Date  . Abnormal Pap smear    repeat ok  . Fibromyalgia 09/02/2017  . Herpes zoster   . Panic attacks   . PCOS (polycystic ovarian  syndrome)    conceived on metformin and clomid  . PP SVD 12/02/11 M 12/03/2011    Past Surgical History:  Procedure Laterality Date  . BREAST BIOPSY    . WISDOM TOOTH EXTRACTION      No Known Allergies  Family History  Problem Relation Age of Onset  . Diabetes Father        uncontrolled  . Hypertension Father   . Hyperlipidemia Father   . Alcohol abuse Other   . Arthritis Other   . Ovarian cancer Paternal Grandmother   . Anesthesia problems Neg Hx      Social History   Socioeconomic History  . Marital status: Married    Spouse name: Not on file  . Number of children: 1  . Years of education: 94  . Highest education level: Not on file  Occupational History  . Occupation: Systems developer  Social Needs  . Financial resource strain: Not on file  . Food insecurity:    Worry: Not on file    Inability: Not on file  . Transportation needs:    Medical: Not on file    Non-medical: Not on file  Tobacco Use  . Smoking status: Former Smoker    Last attempt to quit: 11/01/2009    Years since quitting: 8.7  . Smokeless tobacco: Never Used  Substance and Sexual Activity  . Alcohol use: Yes    Alcohol/week: 2.0  standard drinks    Types: 2 Cans of beer per week  . Drug use: No  . Sexual activity: Yes  Lifestyle  . Physical activity:    Days per week: Not on file    Minutes per session: Not on file  . Stress: Not on file  Relationships  . Social connections:    Talks on phone: Not on file    Gets together: Not on file    Attends religious service: Not on file    Active member of club or organization: Not on file    Attends meetings of clubs or organizations: Not on file    Relationship status: Not on file  . Intimate partner violence:    Fear of current or ex partner: Not on file    Emotionally abused: Not on file    Physically abused: Not on file    Forced sexual activity: Not on file  Other Topics Concern  . Not on file  Social History Narrative   Works as a  Systems developer; Fun: [name] out with family.    Denies any religious beliefs effecting health care.    Lives with husband and son in a 2 story home.     Education: college.      PHYSICAL EXAM:  VS: BP 102/72 (BP Location: Left Arm, Patient Position: Sitting, Cuff Size: Normal)   Pulse 92   Ht 5\' 7"  (1.702 m)   Wt 137 lb 3.2 oz (62.2 kg)   SpO2 100%   BMI 21.49 kg/m  Physical Exam Gen: NAD, alert, cooperative with exam, well-appearing ENT: normal lips, normal nasal mucosa,  Eye: normal EOM, normal conjunctiva and lids CV:  no edema, +2 pedal pulses   Resp: no accessory muscle use, non-labored,  GI: No tenderness to palpation over the left upper quadrant.  No crepitus, no step-offs.  No swelling or ecchymosis, no masses Skin: no rashes, no areas of induration  Neuro: normal tone, normal sensation to touch Psych:  normal insight, alert and oriented MSK:  Back/buttock: Tender to palpation over the right mid belly of the piriformis. No pain with logrolling. No tenderness to palpation at the insertion of the proximal hamstring. No significant tenderness to palpation over the greater trochanter. Some tenderness to palpation of the right SI joint. Neurovascular intact  Limited ultrasound: Right buttock:  Piriformis appears to be normal and static and dynamic testing. The sciatic nerve appears to be below the piriformis.  There does not appear to be any pinching effect or reposition of the nerve with logrolling of the right leg.  Summary: Normal exam  Ultrasound and interpretation by Clare Gandy, MD    Aspiration/Injection Procedure Note [name] 04-21-1981  Procedure: Injection Indications: Right buttock pain  Procedure Details Consent: Risks of procedure as well as the alternatives and risks of each were explained to the (patient/caregiver).  Consent for procedure obtained. Time Out: Verified patient identification, verified procedure, site/side was marked,  verified correct patient position, special equipment/implants available, medications/allergies/relevent history reviewed, required imaging and test results available.  Performed.  The area was cleaned with iodine and alcohol swabs.    The right SI joint was injected using 1 cc's of 40 mg Depomedrol and 4 cc's of 1% bupivacaine with a 22 3 1/2" needle.  Ultrasound was used. Images were obtained in  Long views showing the injection.    A sterile dressing was applied.  Patient did tolerate procedure well.     ASSESSMENT & PLAN:  Right buttock pain Does not appear that she has any impingement on ultrasound exam today.  Could be referred pain from the SI joint. -Right SI joint injection today. -If no improvement could consider injection into the ischiofemoral space.  Consider MRI of the right hip  Rib pain I do not appreciate any fracture on exam today.  Possible for costochondral irritation from coughing.  Does not appear to be a GI problem. -Reassurance provided. -If no improvement may need to consider an x-ray.

## 2018-07-17 ENCOUNTER — Other Ambulatory Visit: Payer: Self-pay | Admitting: Nurse Practitioner

## 2018-07-17 ENCOUNTER — Encounter: Payer: Self-pay | Admitting: Family Medicine

## 2018-07-17 ENCOUNTER — Ambulatory Visit (INDEPENDENT_AMBULATORY_CARE_PROVIDER_SITE_OTHER): Payer: No Typology Code available for payment source | Admitting: Family Medicine

## 2018-07-17 ENCOUNTER — Ambulatory Visit: Payer: Self-pay

## 2018-07-17 VITALS — BP 102/72 | HR 92 | Ht 67.0 in | Wt 137.2 lb

## 2018-07-17 DIAGNOSIS — M2550 Pain in unspecified joint: Secondary | ICD-10-CM

## 2018-07-17 DIAGNOSIS — R0781 Pleurodynia: Secondary | ICD-10-CM | POA: Diagnosis not present

## 2018-07-17 DIAGNOSIS — M7918 Myalgia, other site: Secondary | ICD-10-CM | POA: Diagnosis not present

## 2018-07-17 NOTE — Assessment & Plan Note (Signed)
I do not appreciate any fracture on exam today.  Possible for costochondral irritation from coughing.  Does not appear to be a GI problem. -Reassurance provided. -If no improvement may need to consider an x-ray.

## 2018-07-17 NOTE — Patient Instructions (Signed)
Nice to see you  Please let me know if your pain improves or not We could try an injection in a different spot or we could obtain an MRI

## 2018-07-17 NOTE — Assessment & Plan Note (Signed)
Does not appear that she has any impingement on ultrasound exam today.  Could be referred pain from the SI joint. -Right SI joint injection today. -If no improvement could consider injection into the ischiofemoral space.  Consider MRI of the right hip

## 2018-07-18 ENCOUNTER — Other Ambulatory Visit: Payer: No Typology Code available for payment source

## 2018-07-18 DIAGNOSIS — M2550 Pain in unspecified joint: Secondary | ICD-10-CM

## 2018-07-21 LAB — ANTI-NUCLEAR AB-TITER (ANA TITER): ANA Titer 1: 1:80 {titer} — ABNORMAL HIGH

## 2018-07-21 LAB — ANA: Anti Nuclear Antibody(ANA): POSITIVE — AB

## 2018-07-24 ENCOUNTER — Encounter: Payer: Self-pay | Admitting: Family Medicine

## 2018-07-24 DIAGNOSIS — M7918 Myalgia, other site: Secondary | ICD-10-CM

## 2018-07-25 MED FILL — PREGABALIN 75 MG CAPS: 75 | 30 days supply | Qty: 60 | Fill #1

## 2018-08-15 ENCOUNTER — Telehealth: Payer: Self-pay | Admitting: Emergency Medicine

## 2018-08-15 ENCOUNTER — Ambulatory Visit (HOSPITAL_COMMUNITY)
Admission: RE | Admit: 2018-08-15 | Discharge: 2018-08-15 | Disposition: A | Discharge status: Home / Self Care | Payer: No Typology Code available for payment source | Source: Ambulatory Visit | Attending: Family Medicine | Admitting: Family Medicine

## 2018-08-15 DIAGNOSIS — M7918 Myalgia, other site: Secondary | ICD-10-CM | POA: Diagnosis present

## 2018-08-15 DIAGNOSIS — M21851 Other specified acquired deformities of right thigh: Secondary | ICD-10-CM | POA: Insufficient documentation

## 2018-08-15 NOTE — Telephone Encounter (Signed)
Copied from CRM [id number]. Topic: General - Other >> Aug 15, 2018 12:43 PM Jaquita Rector A wrote: Reason for CRM: Angela [name] from [facility] scheduling called to get clarification on orders sent over for patient for an MRI. Patient was sent to [facility] but she preferred [facility] so appointment is for 5 pm today, they just need clarification on whether Dr want am MRI or an arthrogram. Request a call back Ph# [phone]

## 2018-08-15 NOTE — Telephone Encounter (Signed)
Spoke with Angela [name], verified order-verbalized understanding.

## 2018-08-16 ENCOUNTER — Telehealth: Payer: Self-pay | Admitting: Family Medicine

## 2018-08-16 ENCOUNTER — Encounter: Payer: Self-pay | Admitting: Family Medicine

## 2018-08-16 NOTE — Telephone Encounter (Signed)
Left VM for patient. If she calls back please have her speak with a nurse/CMA and inform that her MRI doesn't show a source of her pain. We could try an injection in a different location to see if that is the source of her pain. The [facility] can report results to patient.   If any questions then please take the best time and phone number to call and I will try to call her back.   Myra Rude, MD [facility] 08/16/2018, 1:04 PM

## 2018-08-17 NOTE — Telephone Encounter (Signed)
Spoke with patient about her MRI.   Myra Rude, MD [facility] 08/17/2018, 9:30 AM

## 2018-08-21 ENCOUNTER — Ambulatory Visit (INDEPENDENT_AMBULATORY_CARE_PROVIDER_SITE_OTHER): Payer: No Typology Code available for payment source | Admitting: Family Medicine

## 2018-08-21 ENCOUNTER — Ambulatory Visit (INDEPENDENT_AMBULATORY_CARE_PROVIDER_SITE_OTHER): Payer: No Typology Code available for payment source

## 2018-08-21 VITALS — BP 112/78 | HR 88 | Temp 98.8°F

## 2018-08-21 DIAGNOSIS — M7918 Myalgia, other site: Secondary | ICD-10-CM

## 2018-08-21 MED ORDER — METHYLPREDNISOLONE ACETATE 40 MG/ML IJ SUSP
40.0000 mg | Freq: Once | INTRAMUSCULAR | Status: AC
  Administered 2018-08-21: 40 mg via INTRAMUSCULAR

## 2018-08-21 NOTE — Assessment & Plan Note (Signed)
Pain is ongoing. MRI not showing any specific pathology. Possible that she has ischiofemoral impingement. - injection today  - if no improvement may need to work up the back as the source of her pain.

## 2018-08-21 NOTE — Progress Notes (Signed)
Angela [name] - 37 y.o. female MRN [id number]  Date of birth: [birth year]  SUBJECTIVE:  Including CC & ROS.  Chief Complaint  Patient presents with  . Injections    Angela [name] is a 37 y.o. female that is presenting with ongoing gluteal pain.  Have tried different injections with limited improvement.  The pain is severe and worse at the end of the day.  The pain is located in the right gluteal region.  She is tried medications with limited improvement.  The pain is localized to this area.  She denies any mechanical symptoms.  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   Review of Systems  Constitutional: Negative for fever.  HENT: Negative for congestion.   Respiratory: Negative for cough.   Cardiovascular: Negative for chest pain.  Gastrointestinal: Negative for abdominal distention.  Musculoskeletal: Negative for gait problem.  Skin: Negative for color change.  Neurological: Negative for weakness.  Hematological: Negative for adenopathy.  Psychiatric/Behavioral: Negative for agitation.    HISTORY: Past Medical, Surgical, Social, and Family History Reviewed & Updated per EMR.   Pertinent Historical Findings include:  Past Medical History:  Diagnosis Date  . Abnormal Pap smear    repeat ok  . Fibromyalgia 09/02/2017  . Herpes zoster   . Panic attacks   . PCOS (polycystic ovarian syndrome)    conceived on metformin and clomid  . PP SVD 12/02/11 M 12/03/2011    Past Surgical History:  Procedure Laterality Date  . BREAST BIOPSY    . WISDOM TOOTH EXTRACTION      No Known Allergies  Family History  Problem Relation Age of Onset  . Diabetes Father        uncontrolled  . Hypertension Father   . Hyperlipidemia Father   . Alcohol abuse Other   . Arthritis Other   . Ovarian cancer Paternal Grandmother   . Anesthesia problems Neg Hx      Social History   Socioeconomic History  . Marital status: Married    Spouse name: Not on  file  . Number of children: 1  . Years of education: 26  . Highest education level: Not on file  Occupational History  . Occupation: Systems developer  Social Needs  . Financial resource strain: Not on file  . Food insecurity:    Worry: Not on file    Inability: Not on file  . Transportation needs:    Medical: Not on file    Non-medical: Not on file  Tobacco Use  . Smoking status: Former Smoker    Last attempt to quit: 11/01/2009    Years since quitting: 8.8  . Smokeless tobacco: Never Used  Substance and Sexual Activity  . Alcohol use: Yes    Alcohol/week: 2.0 standard drinks    Types: 2 Cans of beer per week  . Drug use: No  . Sexual activity: Yes  Lifestyle  . Physical activity:    Days per week: Not on file    Minutes per session: Not on file  . Stress: Not on file  Relationships  . Social connections:    Talks on phone: Not on file    Gets together: Not on file    Attends religious service: Not on file    Active member of club or organization: Not on file    Attends meetings of clubs or organizations: Not on file    Relationship status: Not on file  .  Intimate partner violence:    Fear of current or ex partner: Not on file    Emotionally abused: Not on file    Physically abused: Not on file    Forced sexual activity: Not on file  Other Topics Concern  . 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    Denies any religious beliefs effecting health care.    Lives with husband and son in a 2 story home.     Education: college.      PHYSICAL EXAM:  VS: BP 112/78 (BP Location: Right Arm, Patient Position: Sitting, Cuff Size: Normal)   Pulse 88   Temp 98.8 F (37.1 C) (Oral)   SpO2 99%  Physical Exam Gen: NAD, alert, cooperative with exam, well-appearing ENT: normal lips, normal nasal mucosa,  Eye: normal EOM, normal conjunctiva and lids CV:  no edema, +2 pedal pulses   Resp: no accessory muscle use,  non-labored,  Skin: no rashes, no areas of induration  Neuro: normal tone, normal sensation to touch Psych:  normal insight, alert and oriented MSK:  Right hip: Normal strength resistance with hip flexion. Normal internal and external rotation. No significant tenderness palpation over the piriformis or greater trochanter. Some tenderness palpation of the quadratus Neurovascularly intact   Aspiration/Injection Procedure Note Angela [name] [birth year]  Procedure: Injection Indications: Right gluteal pain  Procedure Details Consent: Risks of procedure as well as the alternatives and risks of each were explained to the (patient/caregiver).  Consent for procedure obtained. Time Out: Verified patient identification, verified procedure, site/side was marked, verified correct patient position, special equipment/implants available, medications/allergies/relevent history reviewed, required imaging and test results available.  Performed.  The area was cleaned with iodine and alcohol swabs.    The right ischiofemoral space was injected using using 7 cc of 1% lidocaine without epinephrine on a 22-gauge 3-1/2 inch needle.  The syringe was then switched and a mixture containing 1 cc's of 40 mg Depomedrol and 4 cc's of 1% lidocaine was injected. ultrasound was used. Images were obtained in  Long views showing the injection.    A sterile dressing was applied.  Patient did tolerate procedure well.       ASSESSMENT & PLAN:   Right buttock pain Pain is ongoing. MRI not showing any specific pathology. Possible that she has ischiofemoral impingement. - injection today  - if no improvement may need to work up the back as the source of her pain.

## 2018-08-21 NOTE — Patient Instructions (Signed)
Good to see you  Please see me back or email me if your pain doesn't improve  Please try to ice and massage the area.

## 2018-08-22 ENCOUNTER — Encounter: Payer: Self-pay | Admitting: Family Medicine

## 2018-08-27 ENCOUNTER — Encounter: Payer: Self-pay | Admitting: Family Medicine

## 2018-08-27 DIAGNOSIS — M7918 Myalgia, other site: Secondary | ICD-10-CM

## 2018-08-28 MED FILL — PREGABALIN 75 MG CAPS: 75 | 30 days supply | Qty: 60 | Fill #2

## 2018-09-13 ENCOUNTER — Ambulatory Visit (HOSPITAL_COMMUNITY)
Admission: RE | Admit: 2018-09-13 | Discharge: 2018-09-13 | Disposition: A | Discharge status: Home / Self Care | Payer: No Typology Code available for payment source | Source: Ambulatory Visit | Attending: Family Medicine | Admitting: Family Medicine

## 2018-09-13 DIAGNOSIS — M4186 Other forms of scoliosis, lumbar region: Secondary | ICD-10-CM | POA: Diagnosis not present

## 2018-09-13 DIAGNOSIS — M7918 Myalgia, other site: Secondary | ICD-10-CM | POA: Insufficient documentation

## 2018-09-14 ENCOUNTER — Telehealth: Payer: Self-pay | Admitting: Family Medicine

## 2018-09-14 MED FILL — SERTRALINE HCL 100 MG TAB: 100 | 90 days supply | Qty: 135 | Fill #2

## 2018-09-14 NOTE — Telephone Encounter (Signed)
Informed of MRI results.   Myra RudeSchmitz, Jeremy E, MD [facility] 09/14/2018, 5:03 PM

## 2018-09-19 IMAGING — DX DG HIP (WITH OR WITHOUT PELVIS) 2-3V*R*
3 series · 3 of 3 positions shown · non-contrast
Comparison: No prior.

CLINICAL DATA: Chronic right hip pain.  No known injury.

EXAM:
DG HIP (WITH OR WITHOUT PELVIS) 2-3V RIGHT

[pelvis ap]
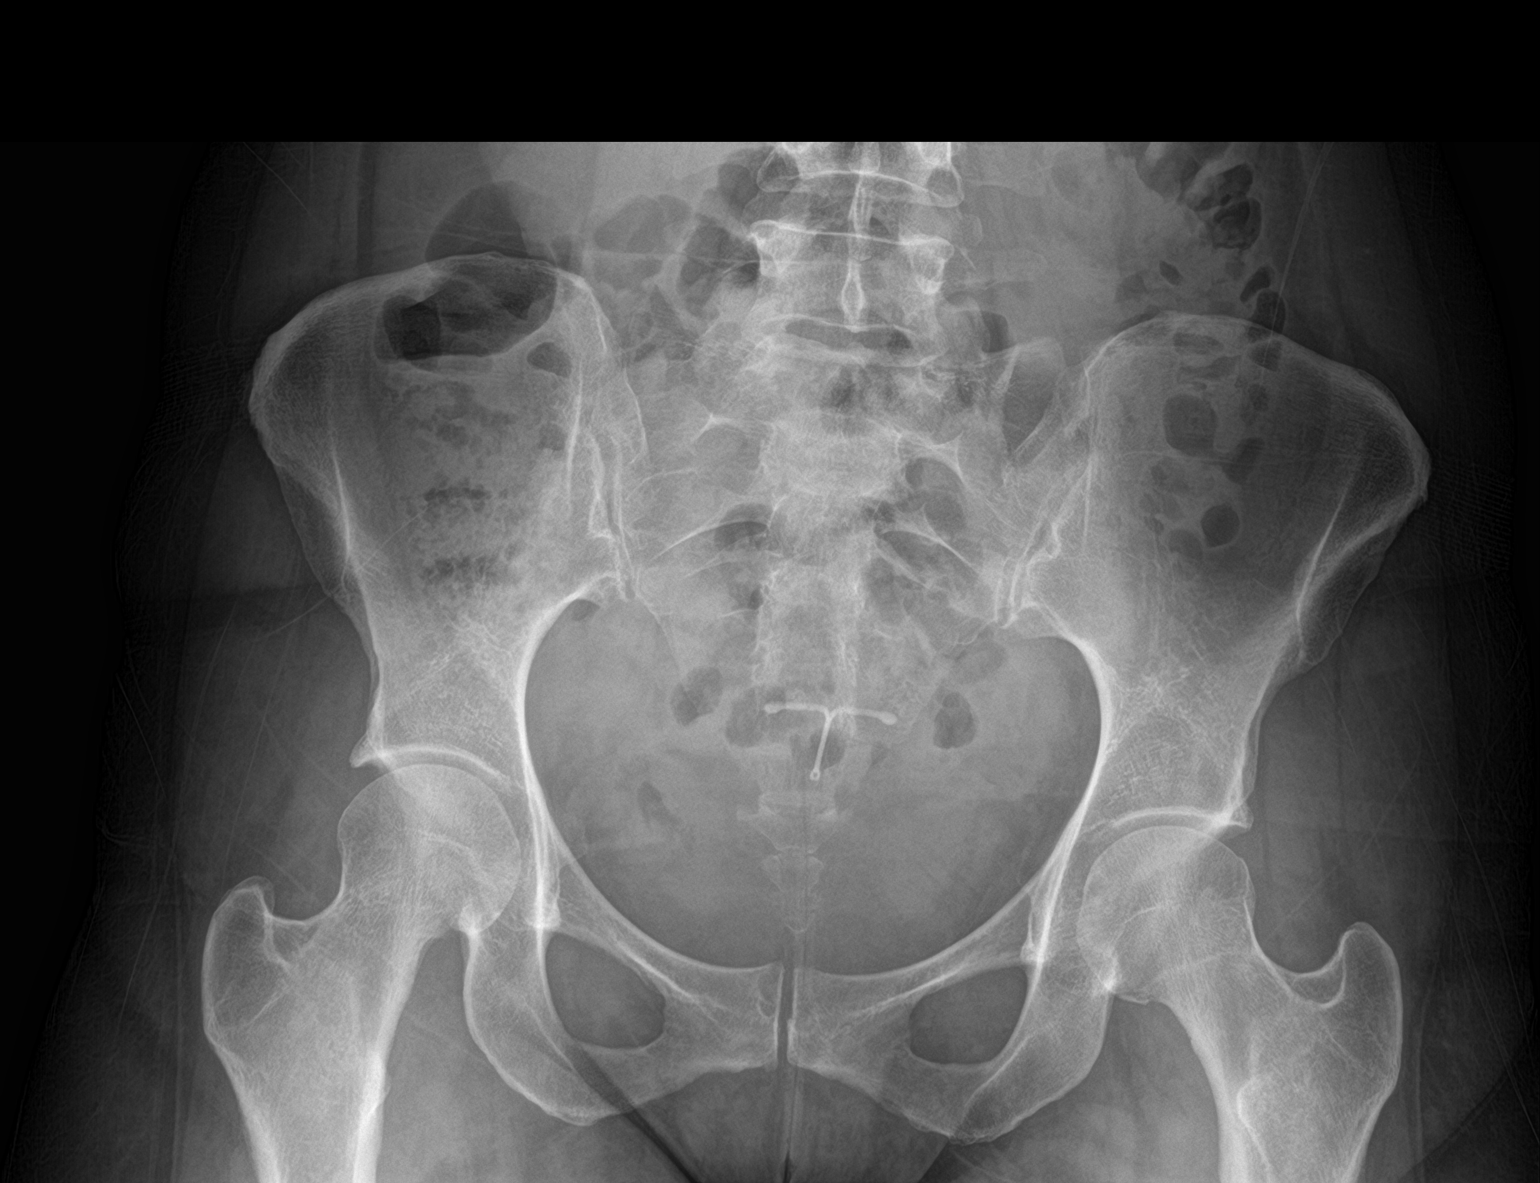

[hip ap]
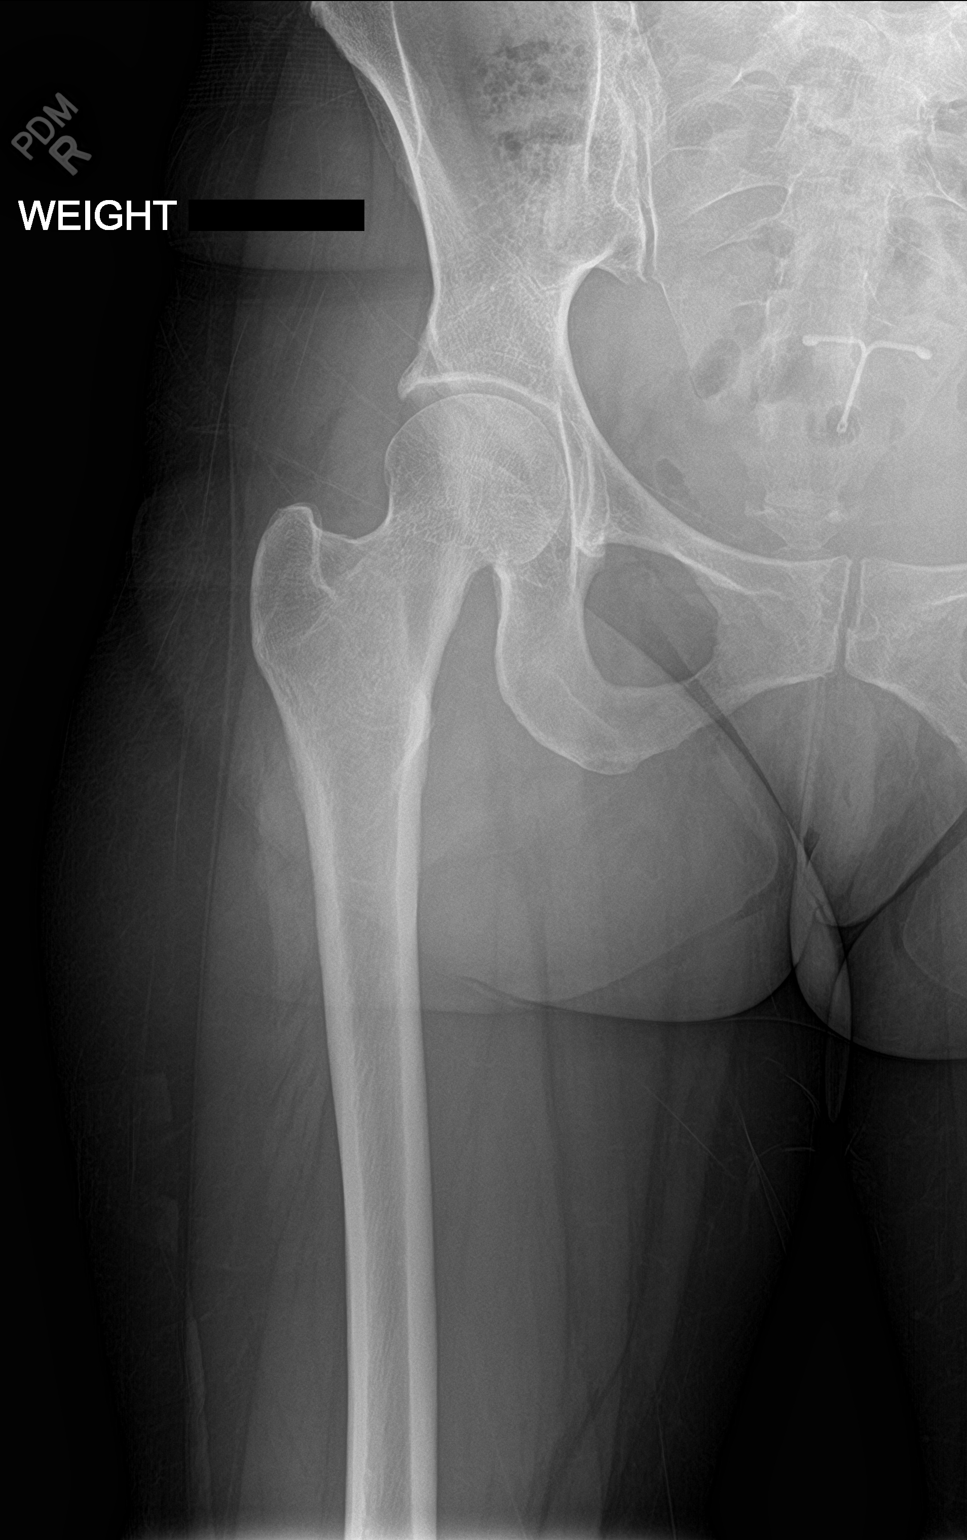

[hip lat]
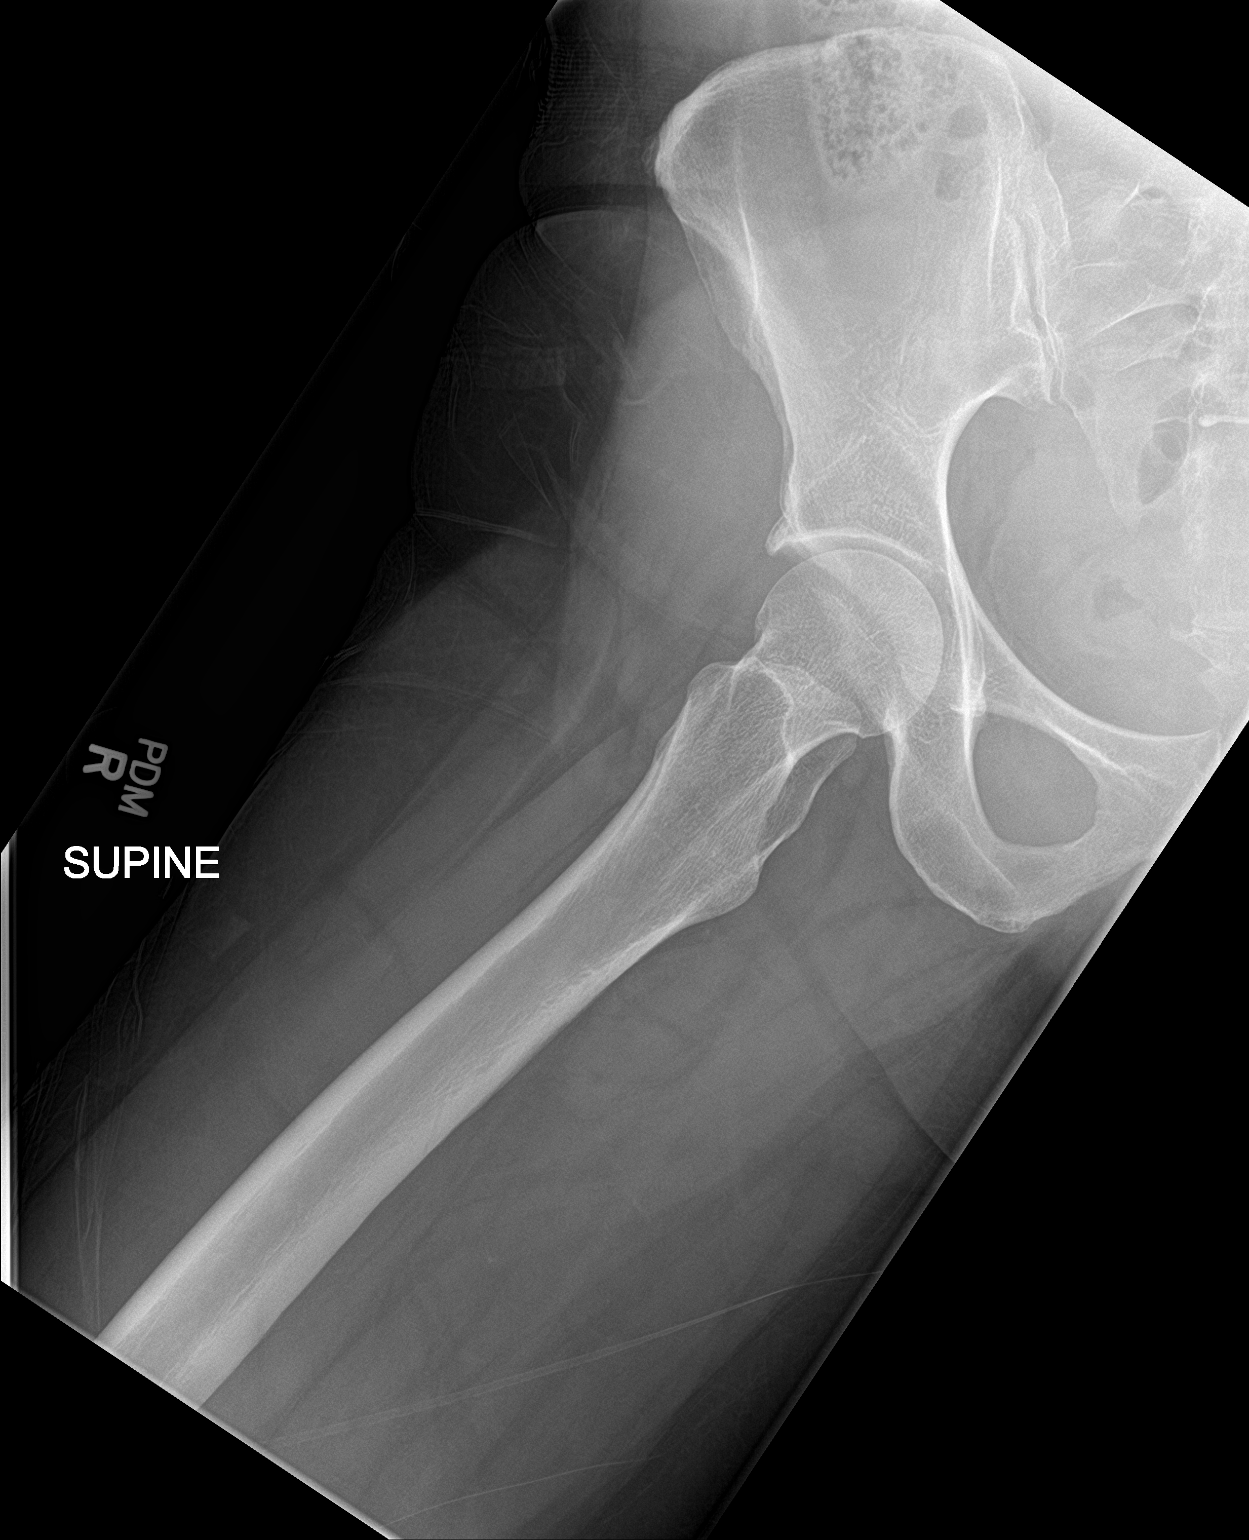

[3 of 3 positions shown; findings below may reference images not displayed]

FINDINGS: No acute bony or joint abnormality identified. Mild degenerative
changes both hips. IUD noted in anatomic position.
IMPRESSION: Mild degenerative change right hip. No acute or focal bony
abnormality.

## 2018-09-24 ENCOUNTER — Encounter: Payer: Self-pay | Admitting: Family

## 2018-09-24 ENCOUNTER — Ambulatory Visit (INDEPENDENT_AMBULATORY_CARE_PROVIDER_SITE_OTHER): Payer: Self-pay | Admitting: Family

## 2018-09-24 VITALS — BP 110/70 | HR 85 | Temp 98.5°F

## 2018-09-24 DIAGNOSIS — J301 Allergic rhinitis due to pollen: Secondary | ICD-10-CM

## 2018-09-24 DIAGNOSIS — J069 Acute upper respiratory infection, unspecified: Secondary | ICD-10-CM

## 2018-09-24 MED ORDER — FLUTICASONE PROPIONATE 50 MCG/ACT NA SUSP: 2.0000 | Freq: Every day | NASAL | 6 refills | Status: DC

## 2018-09-24 MED ORDER — CETIRIZINE HCL 10 MG PO TABS: 10.0000 mg | ORAL_TABLET | Freq: Every day | ORAL | 11 refills | Status: DC

## 2018-09-24 NOTE — Patient Instructions (Signed)
Upper Respiratory Infection, Adult Most upper respiratory infections (URIs) are caused by a virus. A URI affects the nose, throat, and upper air passages. The most common type of URI is often called "the common cold." Follow these instructions at home:  Take medicines only as told by your doctor.  Gargle warm saltwater or take cough drops to comfort your throat as told by your doctor.  Use a warm mist humidifier or inhale steam from a shower to increase air moisture. This may make it easier to breathe.  Drink enough fluid to keep your pee (urine) clear or pale yellow.  Eat soups and other clear broths.  Have a healthy diet.  Rest as needed.  Go back to work when your fever is gone or your doctor says it is okay. ? You may need to stay home longer to avoid giving your URI to others. ? You can also wear a face mask and wash your hands often to prevent spread of the virus.  Use your inhaler more if you have asthma.  Do not use any tobacco products, including cigarettes, chewing tobacco, or electronic cigarettes. If you need help quitting, ask your doctor. Contact a doctor if:  You are getting worse, not better.  Your symptoms are not helped by medicine.  You have chills.  You are getting more short of breath.  You have brown or red mucus.  You have yellow or brown discharge from your nose.  You have pain in your face, especially when you bend forward.  You have a fever.  You have puffy (swollen) neck glands.  You have pain while swallowing.  You have white areas in the back of your throat. Get help right away if:  You have very bad or constant: ? Headache. ? Ear pain. ? Pain in your forehead, behind your eyes, and over your cheekbones (sinus pain). ? Chest pain.  You have long-lasting (chronic) lung disease and any of the following: ? Wheezing. ? Long-lasting cough. ? Coughing up blood. ? A change in your usual mucus.  You have a stiff neck.  You have  changes in your: ? Vision. ? Hearing. ? Thinking. ? Mood. This information is not intended to replace advice given to you by your health care provider. Make sure you discuss any questions you have with your health care provider. Document Released: 04/05/2008 Document Revised: 06/20/2016 Document Reviewed: 01/23/2014 Elsevier Interactive Patient Education  2018 [employer]vier Inc[employer]  

## 2018-09-24 NOTE — Progress Notes (Signed)
   Subjective:    Patient ID: Angela DillonHeather L [name], female    DOB: 1981/09/02, 37 y.o.   MRN: [id number]  No chief complaint on file.   Sinusitis  This is a new problem. The current episode started in the past 7 days (two days ago). There has been no fever. Her pain is at a severity of 3/10. The pain is mild. Associated symptoms include congestion, coughing (at morning), ear pain (left), headaches, a hoarse voice, sinus pressure and sneezing. Pertinent negatives include no chills or sore throat. Past treatments include oral decongestants. The treatment provided mild relief.      Review of Systems  Constitutional: Negative for chills.  HENT: Positive for congestion, ear pain (left), hoarse voice, sinus pressure and sneezing. Negative for sore throat.   Respiratory: Positive for cough (at morning).   Neurological: Positive for headaches.  All other systems reviewed and are negative.      Objective:   Physical Exam  Constitutional: She is oriented to person, place, and time. She appears well-developed and well-nourished. No distress.  HENT:  Head: Normocephalic and atraumatic.  Right Ear: External ear normal.  Left Ear: External ear normal.  Nose: Mucosal edema and rhinorrhea present. Right sinus exhibits maxillary sinus tenderness. Left sinus exhibits maxillary sinus tenderness.  Mouth/Throat: Posterior oropharyngeal erythema present.  Eyes: Pupils are equal, round, and reactive to light.  Neck: Normal range of motion. Neck supple. No thyromegaly present.  Cardiovascular: Normal rate, regular rhythm, normal heart sounds and intact distal pulses.  No murmur heard. Pulmonary/Chest: Effort normal and breath sounds normal. No respiratory distress. She has no wheezes.  Abdominal: Soft. Bowel sounds are normal. She exhibits no distension. There is no tenderness.  Musculoskeletal: Normal range of motion. She exhibits no edema or tenderness.  Neurological: She is alert and oriented to person,  place, and time. She has normal reflexes. No cranial nerve deficit.  Skin: Skin is warm and dry.  Psychiatric: She has a normal mood and affect. Her behavior is normal. Judgment and thought content normal.  Vitals reviewed.     BP 110/70   Pulse 85   Temp 98.5 F (36.9 C)   SpO2 100%      Assessment & Plan:  Angela [name] comes in today with chief complaint of focus-sinus   Diagnosis and orders addressed:  1. Allergic rhinitis due to pollen, unspecified seasonality Start Zyrtec and flonase - cetirizine (ZYRTEC) 10 MG tablet; Take 1 tablet (10 mg total) by mouth daily.  Dispense: 30 tablet; Refill: 11 - fluticasone (FLONASE) 50 MCG/ACT nasal spray; Place 2 sprays into both nostrils daily.  Dispense: 16 g; Refill: 6  2. Viral upper respiratory tract infection - Take meds as prescribed - Use a cool mist humidifier  -Use saline nose sprays frequently -Force fluids -For any cough or congestion  Use plain Mucinex- regular strength or max strength is fine -For fever or aces or pains- take tylenol or ibuprofen. -Throat lozenges if help -RTO if symptoms worsen or do not improve - fluticasone (FLONASE) 50 MCG/ACT nasal spray; Place 2 sprays into both nostrils daily.  Dispense: 16 g; Refill: 6  Jannifer Rodneyhristy Neesa Knapik, FNP

## 2018-10-05 ENCOUNTER — Telehealth: Payer: Self-pay | Admitting: Family Medicine

## 2018-10-05 NOTE — Telephone Encounter (Signed)
Left VM for patient. If she calls back please have her speak with a nurse/CMA and inform that we could try trigger point injections in the lower back to see if that helps with her pain. Otherwise we've done about all we can thus far. The [facility] can report results to patient.   If any questions then please take the best time and phone number to call and I will try to call her back.   Myra RudeSchmitz, Jermaine Neuharth E, MD [facility] 10/05/2018, 9:08 AM

## 2018-10-08 ENCOUNTER — Encounter: Payer: Self-pay | Admitting: Family Medicine

## 2018-10-08 ENCOUNTER — Encounter: Payer: Self-pay | Admitting: Nurse Practitioner

## 2018-10-09 ENCOUNTER — Other Ambulatory Visit: Payer: Self-pay | Admitting: Nurse Practitioner

## 2018-10-09 DIAGNOSIS — G894 Chronic pain syndrome: Secondary | ICD-10-CM

## 2018-10-09 MED FILL — PREGABALIN 75 MG CAPS: 75 | 30 days supply | Qty: 60 | Fill #0

## 2018-10-09 NOTE — Telephone Encounter (Signed)
Check [place] registry last filled 08/28/2018../lmb  

## 2018-10-29 IMAGING — MR MR HIP*R* W/O CM
5 of 7 series · 19 of 40 positions shown · non-contrast
Comparison: Plain films right hip 07/06/2018.

CLINICAL DATA: Right hip pain for 6 years.  No known injury.

EXAM:
MR OF THE RIGHT HIP WITHOUT CONTRAST
TECHNIQUE: Multiplanar, multisequence MR imaging was performed. No intravenous
contrast was administered.

[Series 3: T1 · coronal · 4.0mm · 0.78mm/px · 1 of 35 slices shown]
[im 1/35]
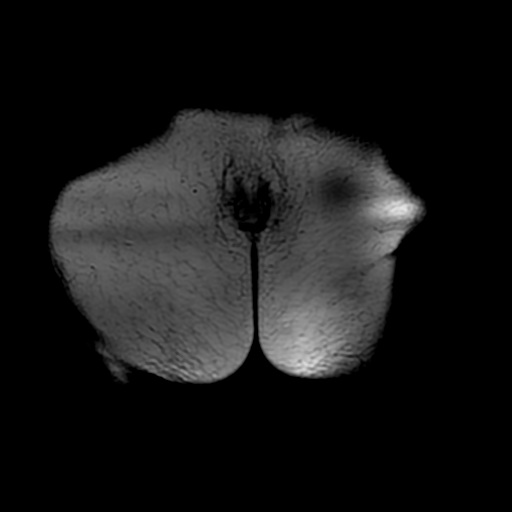

[Series 6: PD · sagittal · 4.0mm · 0.35mm/px · 5 of 23 slices shown (1 of 4)]
[im 1/23]
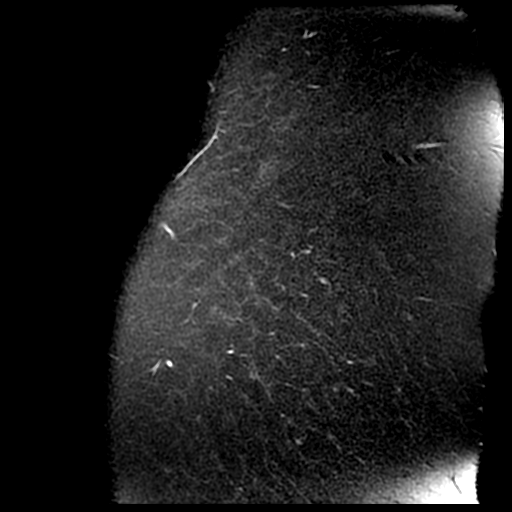
[im 6/23]
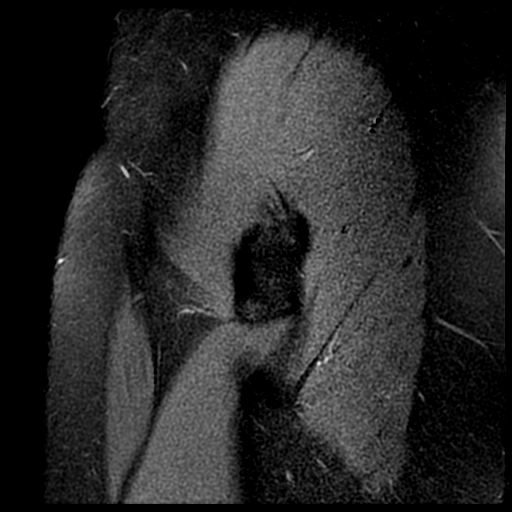
[im 12/23]
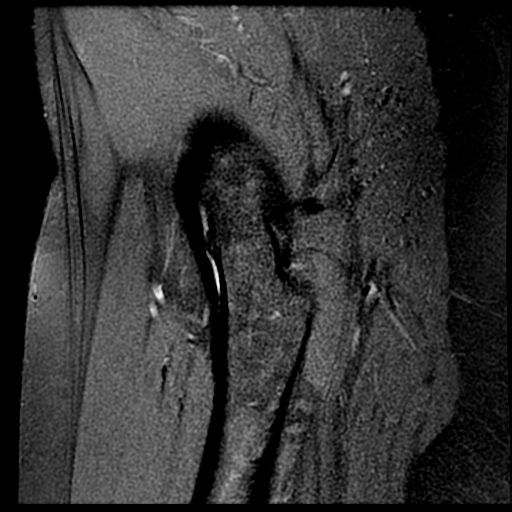
[im 17/23]
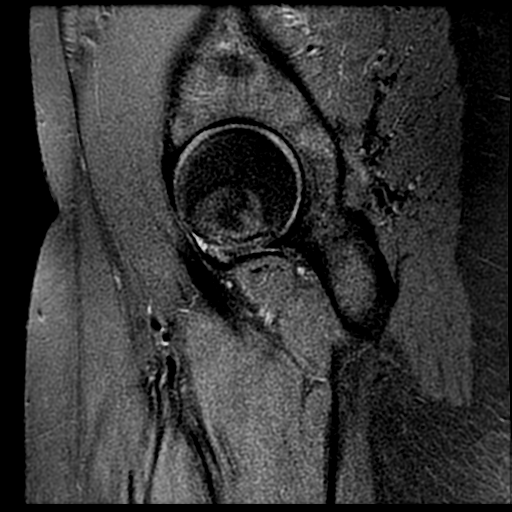
[im 23/23]
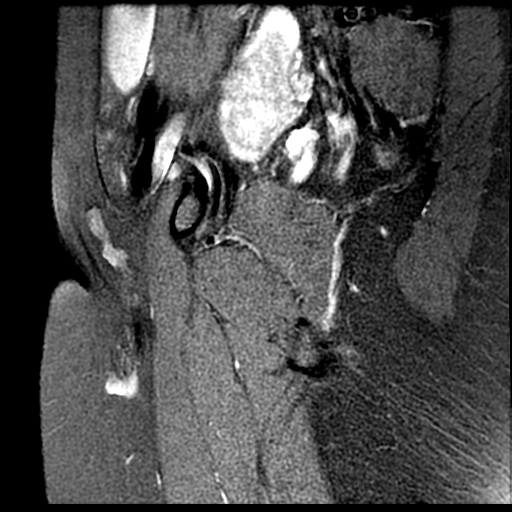

[Series 7: PD · coronal · 4.0mm · 0.35mm/px · 5 of 21 slices shown (2 of 4)]
[im 1/21]
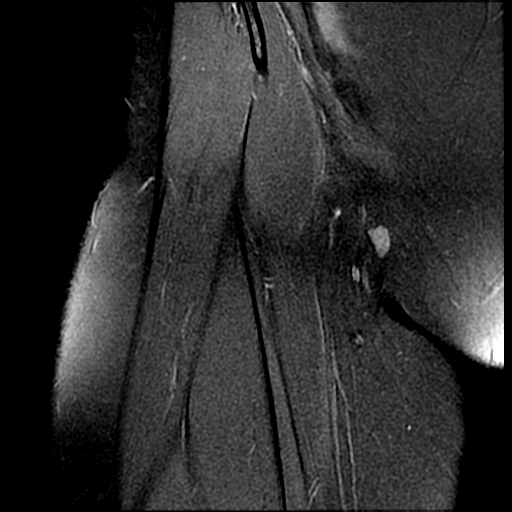
[im 6/21]
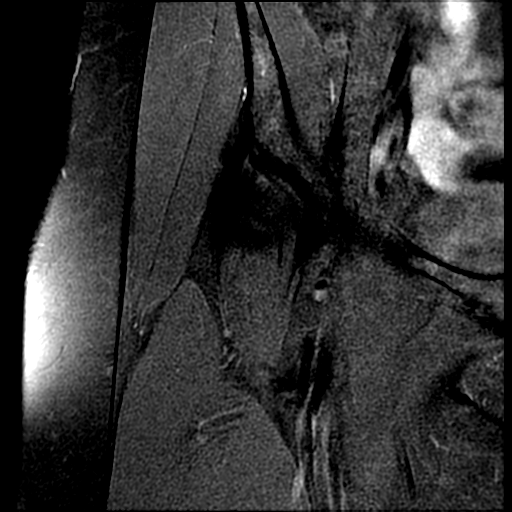
[im 11/21]
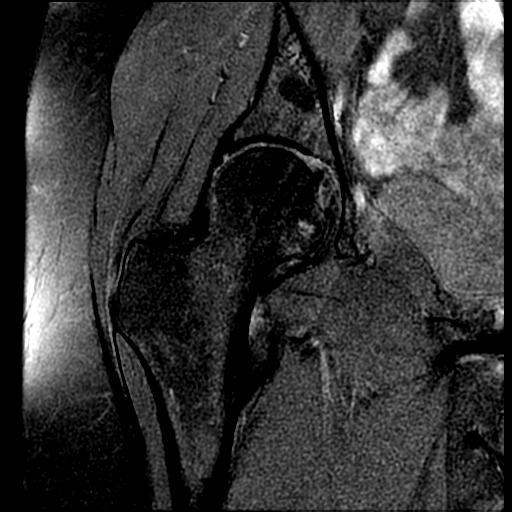
[im 16/21]
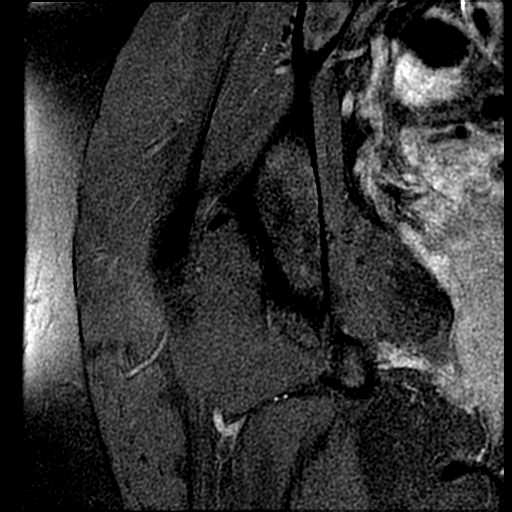
[im 21/21]
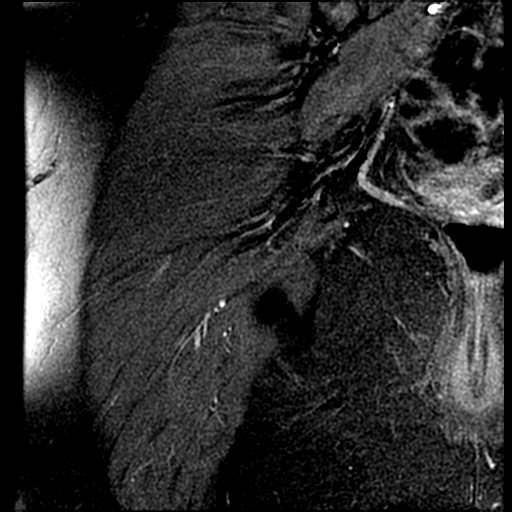

[Series 8: PD · oblique · 4.0mm · 0.35mm/px · 4 of 20 slices shown (3 of 4)]
[im 1/20]
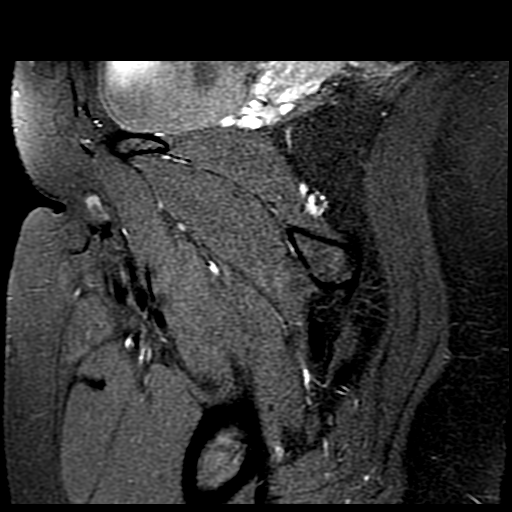
[im 7/20]
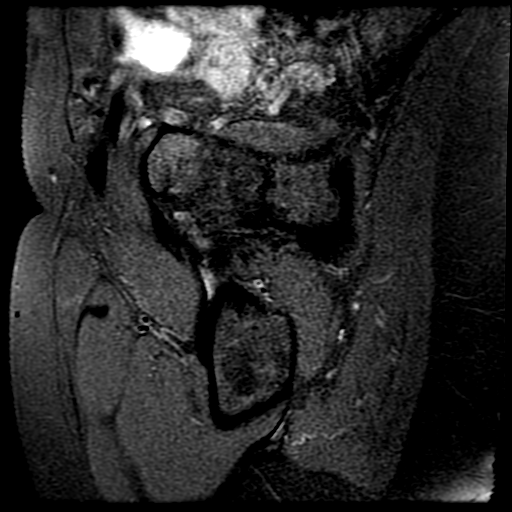
[im 13/20]
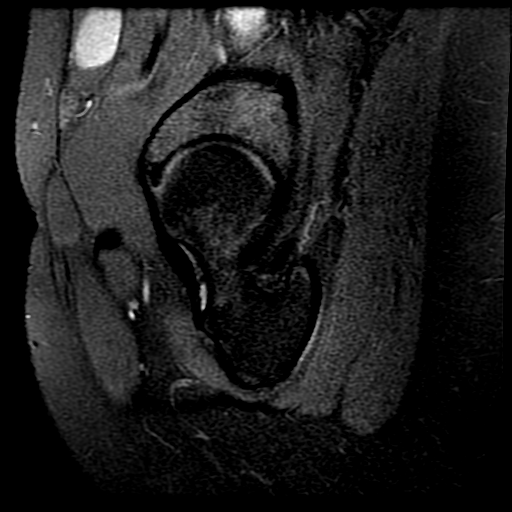
[im 20/20]
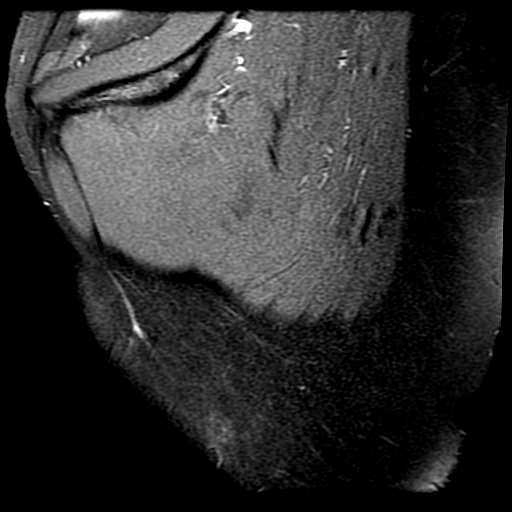

[Series 9: PD · axial · 4.0mm · 0.35mm/px · z∈[-97,-13]mm · 4 of 20 slices shown (4 of 4)]
[im 1/20]
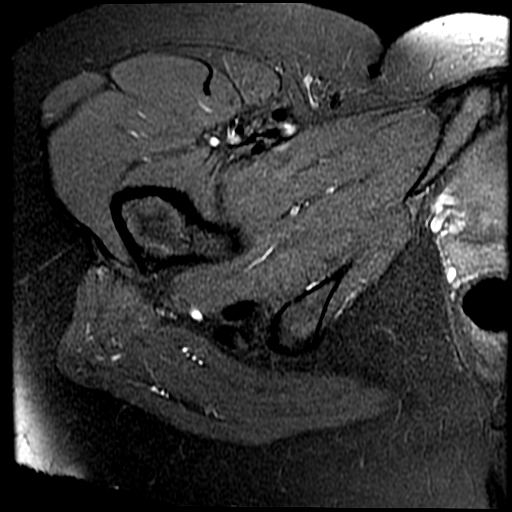
[im 7/20]
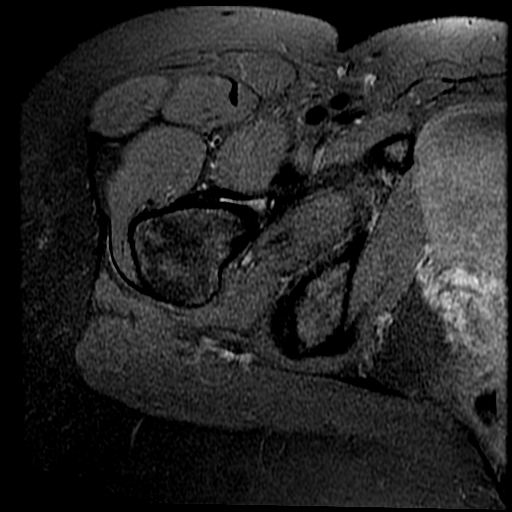
[im 13/20]
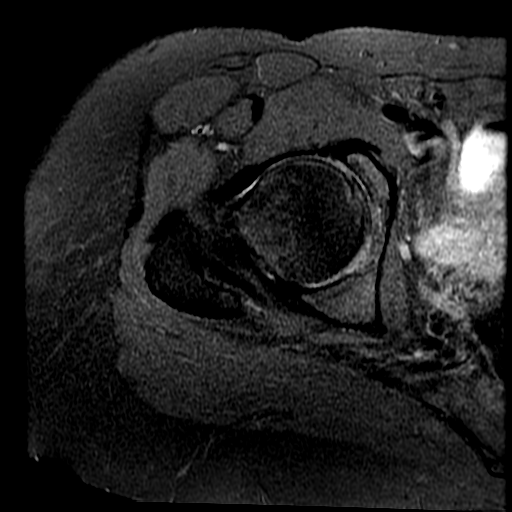
[im 20/20]
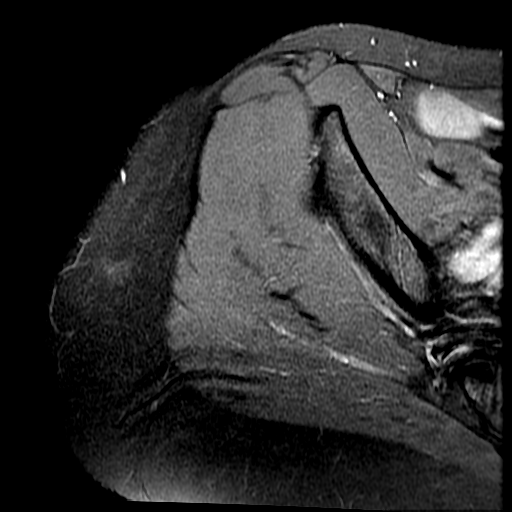

[19 of 40 positions shown; findings below may reference images not displayed]

FINDINGS: Bones: Marrow signal is normal without fracture, stress change or
focal lesion. Mild coxa valga on the right is noted. There is no
subchondral cyst formation or edema about either hip. No avascular
necrosis of the femoral heads.

Articular cartilage and labrum

Articular cartilage:  Normal.

Labrum:  Intact.

Joint or bursal effusion

Joint effusion:  None.

Bursae: Normal.

Muscles and tendons

Muscles and tendons:  Normal.

Other findings

Miscellaneous: IUD is in place. Imaged intrapelvic contents
demonstrate no acute or focal abnormality.
IMPRESSION: Mild coxa valga on the right. The right hip is otherwise
unremarkable. No finding to explain the patient's pain.

## 2018-11-19 MED FILL — PREGABALIN 75 MG CAPS: 75 | 30 days supply | Qty: 60 | Fill #1

## 2018-11-22 ENCOUNTER — Other Ambulatory Visit (INDEPENDENT_AMBULATORY_CARE_PROVIDER_SITE_OTHER): Payer: No Typology Code available for payment source

## 2018-11-22 ENCOUNTER — Ambulatory Visit (INDEPENDENT_AMBULATORY_CARE_PROVIDER_SITE_OTHER): Payer: No Typology Code available for payment source | Admitting: Nurse Practitioner

## 2018-11-22 ENCOUNTER — Encounter: Payer: Self-pay | Admitting: Nurse Practitioner

## 2018-11-22 VITALS — BP 136/70 | HR 97 | Temp 98.4°F | Ht 67.0 in | Wt 138.0 lb

## 2018-11-22 DIAGNOSIS — R109 Unspecified abdominal pain: Secondary | ICD-10-CM | POA: Diagnosis not present

## 2018-11-22 DIAGNOSIS — R140 Abdominal distension (gaseous): Secondary | ICD-10-CM | POA: Diagnosis not present

## 2018-11-22 DIAGNOSIS — R350 Frequency of micturition: Secondary | ICD-10-CM | POA: Diagnosis not present

## 2018-11-22 DIAGNOSIS — F419 Anxiety disorder, unspecified: Secondary | ICD-10-CM

## 2018-11-22 DIAGNOSIS — F329 Major depressive disorder, single episode, unspecified: Secondary | ICD-10-CM

## 2018-11-22 DIAGNOSIS — R1310 Dysphagia, unspecified: Secondary | ICD-10-CM

## 2018-11-22 LAB — CBC
HCT: 39.1 % (ref 36.0–46.0)
HEMOGLOBIN: 13 g/dL (ref 12.0–15.0)
MCHC: 33.3 g/dL (ref 30.0–36.0)
MCV: 87.5 fl (ref 78.0–100.0)
PLATELETS: 302 10*3/uL (ref 150.0–400.0)
RBC: 4.47 Mil/uL (ref 3.87–5.11)
RDW: 13.4 % (ref 11.5–15.5)
WBC: 7.4 10*3/uL (ref 4.0–10.5)

## 2018-11-22 LAB — URINALYSIS, ROUTINE W REFLEX MICROSCOPIC
BILIRUBIN URINE: NEGATIVE
HGB URINE DIPSTICK: NEGATIVE
KETONES UR: NEGATIVE
LEUKOCYTES UA: NEGATIVE
NITRITE: NEGATIVE
PH: 6 (ref 5.0–8.0)
RBC / HPF: NONE SEEN (ref 0–?)
Specific Gravity, Urine: <=1.005 — AB (ref 1.000–1.030)
TOTAL PROTEIN, URINE-UPE24: NEGATIVE
URINE GLUCOSE: NEGATIVE
UROBILINOGEN UA: 0.2 (ref 0.0–1.0)

## 2018-11-22 LAB — COMPREHENSIVE METABOLIC PANEL
ALT: 13 U/L (ref 0–35)
AST: 16 U/L (ref 0–37)
Albumin: 4.7 g/dL (ref 3.5–5.2)
Alkaline Phosphatase: 41 U/L (ref 39–117)
BUN: 12 mg/dL (ref 6–23)
CALCIUM: 9.8 mg/dL (ref 8.4–10.5)
CHLORIDE: 104 meq/L (ref 96–112)
CO2: 27 meq/L (ref 19–32)
Creatinine, Ser: 0.9 mg/dL (ref 0.40–1.20)
GFR: 70.48 mL/min (ref >60.00)
GLUCOSE: 86 mg/dL (ref 70–99)
POTASSIUM: 4.3 meq/L (ref 3.5–5.1)
Sodium: 139 mEq/L (ref 135–145)
Total Bilirubin: 0.6 mg/dL (ref 0.2–1.2)
Total Protein: 7.4 g/dL (ref 6.0–8.3)

## 2018-11-22 LAB — LIPASE: Lipase: 17 U/L (ref 11.0–59.0)

## 2018-11-22 MED ORDER — OMEPRAZOLE 40 MG PO CPDR: 40.0000 mg | DELAYED_RELEASE_CAPSULE | Freq: Every day | ORAL | 3 refills | Status: DC

## 2018-11-22 MED ORDER — ALPRAZOLAM 0.5 MG PO TABS: 0.5000 mg | ORAL_TABLET | Freq: Every day | ORAL | 0 refills | Status: DC | PRN

## 2018-11-22 MED FILL — OMEPRAZOLE 40 MG CPDR: 40 | 30 days supply | Qty: 30 | Fill #0

## 2018-11-22 MED FILL — ALPRAZolam 0.5 MG TABS: 0.5 | 20 days supply | Qty: 20 | Fill #0

## 2018-11-22 NOTE — Assessment & Plan Note (Signed)
She rarely takes xanax, okay to Continue xanax prn only which we discussed Controlled substance registry reviewed with no irregularities. Continue zoloft as prescribed F/u for new, worsening symptoms - ALPRAZolam (XANAX) 0.5 MG tablet; Take 1 tablet (0.5 mg total) by mouth daily as needed for anxiety or sleep.  Dispense: 20 tablet; Refill: 0

## 2018-11-22 NOTE — Patient Instructions (Addendum)
Head downstairs for labs  Please start prilosec daily  I have placed referral to GI for further evaluation.   Abdominal Pain, Adult  Many things can cause belly (abdominal) pain. Most times, belly pain is not dangerous. Many cases of belly pain can be watched and treated at home. Sometimes belly pain is serious, though. Your doctor will try to find the cause of your belly pain. Follow these instructions at home:  Take over-the-counter and prescription medicines only as told by your doctor. Do not take medicines that help you poop (laxatives) unless told to by your doctor.  Drink enough fluid to keep your pee (urine) clear or pale yellow.  Watch your belly pain for any changes.  Keep all follow-up visits as told by your doctor. This is important. Contact a doctor if:  Your belly pain changes or gets worse.  You are not hungry, or you lose weight without trying.  You are having trouble pooping (constipated) or have watery poop (diarrhea) for more than 2-3 days.  You have pain when you pee or poop.  Your belly pain wakes you up at night.  Your pain gets worse with meals, after eating, or with certain foods.  You are throwing up and cannot keep anything down.  You have a fever. Get help right away if:  Your pain does not go away as soon as your doctor says it should.  You cannot stop throwing up.  Your pain is only in areas of your belly, such as the right side or the left lower part of the belly.  You have bloody or black poop, or poop that looks like tar.  You have very bad pain, cramping, or bloating in your belly.  You have signs of not having enough fluid or water in your body (dehydration), such as: ? Dark pee, very little pee, or no pee. ? Cracked lips. ? Dry mouth. ? Sunken eyes. ? Sleepiness. ? Weakness. This information is not intended to replace advice given to you by your health care provider. Make sure you discuss any questions you have with your health  care provider. Document Released: 04/05/2008 Document Revised: 05/07/2016 Document Reviewed: 03/31/2016 Elsevier Interactive Patient Education  2019 [employer].

## 2018-11-22 NOTE — Progress Notes (Signed)
Angela [name] is a 38 y.o. female with the following history as recorded in [employer]:  Patient Active Problem List   Diagnosis Date Noted  . Rib pain 07/17/2018  . Right buttock pain 02/21/2018  . 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 Fibromyalgia 09/02/2017  . Chronic pain syndrome 07/06/2017  . Polyarthralgia 03/31/2017  . Fatigue 02/07/2017  . Routine general medical examination at a health care facility 02/11/2015  . PP SVD 12/02/11 35wks M 12/03/2011  . POLYCYSTIC OVARIAN DISEASE 01/11/2011  . Anxiety and depression 01/06/2010  . ALLERGIC RHINITIS 01/06/2010    Current Outpatient Medications  Medication Sig Dispense Refill  . ALPRAZolam (XANAX) 0.25 MG tablet 0.25 mg.    . cyclobenzaprine (FLEXERIL) 10 MG tablet Take 0.5-1 tablets (5-10 mg total) by mouth 3 (three) times daily as needed for muscle spasms. 60 tablet 0  . Ibuprofen-Famotidine 800-26.6 MG TABS Take 1 tablet by mouth 3 (three) times daily as needed. 90 tablet 5  . Multiple Vitamin (MULTIVITAMIN WITH MINERALS) TABS tablet Take 1 tablet by mouth daily.    . pregabalin (LYRICA) 75 MG capsule TAKE 1 CAPSULE BY MOUTH 2 TIMES DAILY. 60 capsule 2  . Probiotic Product (PROBIOTIC ADVANCED PO) Take by mouth.    . sertraline (ZOLOFT) 100 MG tablet Take 1.5 tablets (150 mg total) by mouth daily. 135 tablet 2  . valACYclovir (VALTREX) 500 MG tablet Take 500 mg by mouth as needed for lip care.  4  . omeprazole (PRILOSEC) 40 MG capsule Take 1 capsule (40 mg total) by mouth daily. 30 capsule 3   No current facility-administered medications for this visit.     Allergies: Patient has no known allergies.  Past Medical History:  Diagnosis Date  . Abnormal Pap smear    repeat ok  . Fibromyalgia 09/02/2017  . Herpes zoster   . Panic attacks   . PCOS (polycystic ovarian syndrome)    conceived on metformin and clomid  . PP SVD 12/02/11 M 12/03/2011    Past Surgical History:  Procedure Laterality Date  . BREAST BIOPSY    . WISDOM  TOOTH EXTRACTION      Family History  Problem Relation Age of Onset  . Diabetes Father        uncontrolled  . Hypertension Father   . Hyperlipidemia Father   . Alcohol abuse Other   . Arthritis Other   . Ovarian cancer Paternal Grandmother   . Anesthesia problems Neg Hx     Social History   Tobacco Use  . Smoking status: Former Smoker    Last attempt to quit: 11/01/2009    Years since quitting: 9.0  . Smokeless tobacco: Never Used  Substance Use Topics  . Alcohol use: Yes    Alcohol/week: 2.0 standard drinks    Types: 2 Cans of beer per week     Subjective:  [name] is here today requesting evaluation of acute complaint of abdominal pain, which first began about 2 months ago intermittently, then over past 2 weeks the pain has become constant, daily, "burning and bloating" in left upper abdomen radiating into her left side. She has also noticed trouble swallowing, feels food "getting stuck" in her throat and has even gagged and vomited at times when food got stuck.  She has also noticed urinary frequency and some scant vaginal spotting this week, does have IUD and has not noticed spotting before with IUD, does not think she could be pregnant. She denies fevers, chills, syncope, confusion, cp, sob,  acid regurgitation, sore throat, constipation, diarrhea, dysuria, hematuria. On duexis prn  Tried tums no relief  She is also requesting refill of xanax 0.5 prn, which she takes only when needed for acute anxiety, rarely, her last fill was for #30 pills 2 years ago. She feels the xanax does help when she has acute anxiety and she has not noticed any adverse medication effects. On zoloft daily  ROS- See HPI  Objective:  Vitals:   11/22/18 1431  BP: 136/70  Pulse: 97  Temp: 98.4 F (36.9 C)  TempSrc: Oral  SpO2: 99%  Weight: 138 lb (62.6 kg)  Height: 5\' 7"  (1.702 m)    General: Well developed, well nourished, in no acute distress  Skin : Warm and dry.  Head: Normocephalic and  atraumatic  Eyes: Sclera and conjunctiva clear; pupils round and reactive to light; extraocular movements intact  Oropharynx: Pink, supple. No suspicious lesions  Neck: Supple without thyromegaly Lungs: Respirations unlabored; clear to auscultation bilaterally without wheeze, rales, rhonchi  CVS exam: normal rate and regular rhythm, S1 and S2 normal.  Abdomen: Soft; nontender; nondistended; normoactive bowel sounds; no masses or hepatosplenomegaly, no CVA tenderness Extremities: No edema, cyanosis, clubbing  Vessels: Symmetric bilaterally  Neurologic: Alert and oriented; speech intact; face symmetrical; moves all extremities well; CNII-XII intact without focal deficit  Psychiatric: Normal mood and affect.   Assessment:  1. Abdominal pain, unspecified abdominal location   2. Bloating   3. Dysphagia, unspecified type   4. Urinary frequency     Plan:   Labs ordered for further evaluation Start prilosec for possible GERD- medication dosing, side effects discussed Recommend referral to GI For further evaluation, she is agreeable, referral placed Home management, red flags and return precautions including when to seek immediate care discussed and printed on AVS  Return for CPE.  Orders Placed This Encounter  Procedures  . CULTURE, URINE COMPREHENSIVE    Standing Status:   Future    Standing Expiration Date:   12/23/2018  . CBC    Standing Status:   Future    Standing Expiration Date:   11/23/2019  . Comprehensive metabolic panel    Standing Status:   Future    Standing Expiration Date:   11/23/2019  . Urinalysis, Routine w reflex microscopic    Standing Status:   Future    Standing Expiration Date:   11/22/2019  . Lipase    Standing Status:   Future    Standing Expiration Date:   11/22/2019  . Pregnancy, urine    Standing Status:   Future    Standing Expiration Date:   11/22/2019  . Ambulatory referral to Gastroenterology    Referral Priority:   Routine    Referral Type:    Consultation    Referral Reason:   Specialty Services Required    Number of Visits Requested:   1    Requested Prescriptions   Signed Prescriptions Disp Refills  . omeprazole (PRILOSEC) 40 MG capsule 30 capsule 3    Sig: Take 1 capsule (40 mg total) by mouth daily.

## 2018-11-23 ENCOUNTER — Encounter: Payer: Self-pay | Admitting: Nurse Practitioner

## 2018-11-24 LAB — CULTURE, URINE COMPREHENSIVE
MICRO NUMBER:: 89169
SPECIMEN QUALITY: ADEQUATE

## 2018-11-24 LAB — PREGNANCY, URINE: Preg Test, Ur: NEGATIVE

## 2018-11-27 ENCOUNTER — Other Ambulatory Visit: Payer: Self-pay | Admitting: Family

## 2018-11-27 MED ORDER — FLUCONAZOLE 150 MG PO TABS: ORAL_TABLET | ORAL | 0 refills | Status: DC

## 2018-11-27 MED FILL — FLUCONAZOLE 150 MG TABS: 150 | 3 days supply | Qty: 2 | Fill #0

## 2018-11-30 ENCOUNTER — Encounter: Payer: Self-pay | Admitting: Nurse Practitioner

## 2018-12-01 ENCOUNTER — Ambulatory Visit: Payer: No Typology Code available for payment source | Admitting: Nurse Practitioner

## 2018-12-05 ENCOUNTER — Ambulatory Visit: Payer: No Typology Code available for payment source | Admitting: Gastroenterology

## 2018-12-05 ENCOUNTER — Encounter: Payer: Self-pay | Admitting: Gastroenterology

## 2018-12-05 VITALS — BP 120/78 | HR 70 | Ht 66.5 in | Wt 137.4 lb

## 2018-12-05 DIAGNOSIS — K5900 Constipation, unspecified: Secondary | ICD-10-CM | POA: Diagnosis not present

## 2018-12-05 DIAGNOSIS — R079 Chest pain, unspecified: Secondary | ICD-10-CM | POA: Diagnosis not present

## 2018-12-05 DIAGNOSIS — R1310 Dysphagia, unspecified: Secondary | ICD-10-CM | POA: Diagnosis not present

## 2018-12-05 DIAGNOSIS — R1012 Left upper quadrant pain: Secondary | ICD-10-CM | POA: Diagnosis not present

## 2018-12-05 NOTE — Patient Instructions (Addendum)
You have been scheduled for an endoscopy. Please follow written instructions given to you at your visit today. If you use inhalers (even only as needed), please bring them with you on the day of your procedure. Your physician has requested that you go to www.startemmi.com and enter the access code given to you at your visit today. This web site gives a general overview about your procedure. However, you should still follow specific instructions given to you by our office regarding your preparation for the procedure.  Take Miralax 17 g in 8 oz of water daily and dulcolax as needed.   You have been scheduled for an abdominal ultrasound at [facility] (1st floor of hospital) on 12/08/18 at 9:30am. Please arrive 15 minutes prior to your appointment for registration. Make certain not to have anything to eat or drink 6 hours prior to your appointment. Should you need to reschedule your appointment, please contact radiology at [phone]. This test typically takes about 30 minutes to perform.  Thank you for choosing me and [facility].  Venita Lick. Pleas Koch., MD., [facility]

## 2018-12-05 NOTE — Progress Notes (Addendum)
History of Present Illness: This is a 38 year old female referred by Evaristo BuryShambley, Ashleigh N, NP for the evaluation of LUQ, L chest pain, dysphagia, constipation, hemorrhoids.  She relates many years of intermittent difficulties with constipation and occasional hemorrhoidal swelling and bleeding - these symptoms have not changed. The patient primary concern is over the past 2 weeks she has had new problems with ongoing left upper quadrant pain / left lower chest pain that radiates to the left back and epigastrium along with new and worsening solid food dysphagia. LUQ pain and epigastric pain are not changed with meals or bowel movements. She was prescribed omeprazole with no change in symptoms.  She has been taking ibuprofen regularly with no improvement in abdominal pain.  CMP, CBC, lipase unremarkable 2 weeks ago. Denies weight loss, diarrhea, change in stool caliber, melena, hematochezia, nausea, vomiting, reflux symptoms, chest pain.    No Known Allergies Outpatient Medications Prior to Visit  Medication Sig Dispense Refill  . ALPRAZolam (XANAX) 0.5 MG tablet Take 1 tablet (0.5 mg total) by mouth daily as needed for anxiety or sleep. 20 tablet 0  . cyclobenzaprine (FLEXERIL) 10 MG tablet Take 0.5-1 tablets (5-10 mg total) by mouth 3 (three) times daily as needed for muscle spasms. 60 tablet 0  . Ibuprofen-Famotidine 800-26.6 MG TABS Take 1 tablet by mouth 3 (three) times daily as needed. 90 tablet 5  . Multiple Vitamin (MULTIVITAMIN WITH MINERALS) TABS tablet Take 1 tablet by mouth daily.    Marland Kitchen. omeprazole (PRILOSEC) 40 MG capsule Take 1 capsule (40 mg total) by mouth daily. 30 capsule 3  . pregabalin (LYRICA) 75 MG capsule TAKE 1 CAPSULE BY MOUTH 2 TIMES DAILY. 60 capsule 2  . Probiotic Product (PROBIOTIC ADVANCED PO) Take by mouth daily.     . sertraline (ZOLOFT) 100 MG tablet Take 1.5 tablets (150 mg total) by mouth daily. 135 tablet 2  . valACYclovir (VALTREX) 500 MG tablet Take 500 mg by  mouth as needed for lip care.  4  . fluconazole (DIFLUCAN) 150 MG tablet Take 1 tablet today; Repeat after 72 hours 2 tablet 0   No facility-administered medications prior to visit.    Past Medical History:  Diagnosis Date  . Abnormal Pap smear    repeat ok  . Fibromyalgia 09/02/2017  . Herpes zoster   . Panic attacks   . PCOS (polycystic ovarian syndrome)    conceived on metformin and clomid  . PP SVD 12/02/11 M 12/03/2011   Past Surgical History:  Procedure Laterality Date  . BREAST BIOPSY Right    Benign  . WISDOM TOOTH EXTRACTION     Social History   Socioeconomic History  . Marital status: Married    Spouse name: Not on file  . Number of children: 1  . Years of education: 3316  . Highest education level: Not on file  Occupational History  . Occupation: Systems developeradiation Therapist  Social Needs  . Financial resource strain: Not on file  . Food insecurity:    Worry: Not on file    Inability: Not on file  . Transportation needs:    Medical: Not on file    Non-medical: Not on file  Tobacco Use  . Smoking status: Former Smoker    Last attempt to quit: 11/01/2009    Years since quitting: 9.0  . Smokeless tobacco: Never Used  Substance and Sexual Activity  . Alcohol use: Yes    Alcohol/week: 2.0 standard drinks    Types:  2 Cans of beer per week  . Drug use: No  . Sexual activity: Yes    Partners: Male  Lifestyle  . Physical activity:    Days per week: Not on file    Minutes per session: Not on file  . Stress: Not on file  Relationships  . Social connections:    Talks on phone: Not on file    Gets together: Not on file    Attends religious service: Not on file    Active member of club or organization: Not on file    Attends meetings of clubs or organizations: Not on file    Relationship status: Not on file  Other Topics Concern  . 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    Denies any religious beliefs  effecting health care.    Lives with husband and son in a 2 story home.     Education: college.    Family History  Problem Relation Age of Onset  . Diabetes Father        uncontrolled  . Hypertension Father   . Hyperlipidemia Father   . Alcohol abuse Other   . Arthritis Other   . Ovarian cancer Paternal Grandmother   . Pancreatic cancer Cousin        paternal 2nd cousin  . Anesthesia problems Neg Hx       Review of Systems: Pertinent positive and negative review of systems were noted in the above HPI section. All other review of systems were otherwise negative.   Physical Exam: General: Well developed, well nourished, no acute distress Head: Normocephalic and atraumatic Eyes:  sclerae anicteric, EOMI Ears: Normal auditory acuity Mouth: No deformity or lesions Neck: Supple, no masses or thyromegaly Lungs: Clear throughout to auscultation Chest: Tenderness over left costal margin Heart: Regular rate and rhythm; no murmurs, rubs or bruits Abdomen: Soft, mild tenderness in left upper quadrant and epigastrium and non distended. No masses, hepatosplenomegaly or hernias noted. Normal Bowel sounds Rectal: Not done  Musculoskeletal: Symmetrical with no gross deformities  Skin: No lesions on visible extremities Pulses:  Normal pulses noted Extremities: No clubbing, cyanosis, edema or deformities noted Neurological: Alert oriented x 4, grossly nonfocal Cervical Nodes:  No significant cervical adenopathy Inguinal Nodes: No significant inguinal adenopathy Psychological:  Alert and cooperative. Normal mood and affect   Assessment and Recommendations:  1.  LUQ abdominal pain / L lower chest pain radiating to epigastrium, left back and dysphagia for 2 weeks.  Etiology unclear.  Rule out NSAID induced ulcer or gastritis, esophagitis, musculoskeletal or a combination of causes.  Schedule EGD with possible dilation and abdominal ultrasound.  Continue omeprazole 40 mg daily. The risks  (including bleeding, perforation, infection, missed lesions, medication reactions and possible hospitalization or surgery if complications occur), benefits, and alternatives to endoscopy with possible biopsy and possible dilation were discussed with the patient and they consent to proceed.   2.  Constipation.  Begin MiraLAX daily.  Dulcolax daily as needed. Prep H supp or cream bid as needed.    cc: Evaristo Bury, NP [address] [place], [place] [address]

## 2018-12-06 ENCOUNTER — Ambulatory Visit (AMBULATORY_SURGERY_CENTER): Payer: No Typology Code available for payment source | Admitting: Gastroenterology

## 2018-12-06 ENCOUNTER — Encounter: Payer: Self-pay | Admitting: Gastroenterology

## 2018-12-06 VITALS — BP 115/61 | HR 79 | Temp 99.1°F | Resp 13 | Ht 66.0 in | Wt 137.0 lb

## 2018-12-06 DIAGNOSIS — R1310 Dysphagia, unspecified: Secondary | ICD-10-CM

## 2018-12-06 DIAGNOSIS — R1012 Left upper quadrant pain: Secondary | ICD-10-CM

## 2018-12-06 MED ORDER — SODIUM CHLORIDE 0.9 % IV SOLN: 500.0000 mL | Freq: Once | INTRAVENOUS | Status: DC

## 2018-12-06 NOTE — Patient Instructions (Addendum)
Handout on dilation diet give. Per dr. Tyler Aas gard 1-2 by mouth, three times daily as needed for pain   YOU HAD AN ENDOSCOPIC PROCEDURE TODAY AT THE  ENDOSCOPY CENTER:   Refer to the procedure report that was given to you for any specific questions about what was found during the examination.  If the procedure report does not answer your questions, please call your gastroenterologist to clarify.  If you requested that your care partner not be given the details of your procedure findings, then the procedure report has been included in a sealed envelope for you to review at your convenience later.  YOU SHOULD EXPECT: Some feelings of bloating in the abdomen. Passage of more gas than usual.  Walking can help get rid of the air that was put into your GI tract during the procedure and reduce the bloating. If you had a lower endoscopy (such as a colonoscopy or flexible sigmoidoscopy) you may notice spotting of blood in your stool or on the toilet paper. If you underwent a bowel prep for your procedure, you may not have a normal bowel movement for a few days.  Please Note:  You might notice some irritation and congestion in your nose or some drainage.  This is from the oxygen used during your procedure.  There is no need for concern and it should clear up in a day or so.  SYMPTOMS TO REPORT IMMEDIATELY:    Following upper endoscopy (EGD)  Vomiting of blood or coffee ground material  New chest pain or pain under the shoulder blades  Painful or persistently difficult swallowing  New shortness of breath  Fever of 100F or higher  Black, tarry-looking stools  For urgent or emergent issues, a gastroenterologist can be reached at any hour by calling [phone].   DIET:  Clear liquids for two hours ( until 4:00pm ), then soft foods for the rest of today.  Tommorrow you may proceed to your regular diet.  Drink plenty of fluids but you should avoid alcoholic beverages for 24 hours.  ACTIVITY:  You  should plan to take it easy for the rest of today and you should NOT DRIVE or use heavy machinery until tomorrow (because of the sedation medicines used during the test).    FOLLOW UP: Our staff will call the number listed on your records the next business day following your procedure to check on you and address any questions or concerns that you may have regarding the information given to you following your procedure. If we do not reach you, we will leave a message.  However, if you are feeling well and you are not experiencing any problems, there is no need to return our call.  We will assume that you have returned to your regular daily activities without incident.  If any biopsies were taken you will be contacted by phone or by letter within the next 1-3 weeks.  Please call us at [phone] if you have not heard about the biopsies in 3 weeks.    SIGNATURES/CONFIDENTIALITY: You and/or your care partner have signed paperwork which will be entered into your electronic medical record.  These signatures attest to the fact that that the information above on your After Visit Summary has been reviewed and is understood.  Full responsibility of the confidentiality of this discharge information lies with you and/or your care-partner.

## 2018-12-06 NOTE — Op Note (Signed)
[facility] Patient Name: Angela FiddlerHeather Campoli Procedure Date: 12/06/2018 1:29 PM MRN: [id number] Endoscopist: Meryl DareMalcolm T  , MD Age: 38 Referring MD:  Date of Birth: 1981-10-25 Gender: Female Account #: [id number] Procedure:                Upper GI endoscopy Indications:              Abdominal pain in the left upper quadrant, Dysphagia Medicines:                Monitored Anesthesia Care Procedure:                Pre-Anesthesia Assessment:                           - Prior to the procedure, a History and Physical                            was performed, and patient medications and                            allergies were reviewed. The patient's tolerance of                            previous anesthesia was also reviewed. The risks                            and benefits of the procedure and the sedation                            options and risks were discussed with the patient.                            All questions were answered, and informed consent                            was obtained. Prior Anticoagulants: The patient has                            taken no previous anticoagulant or antiplatelet                            agents. ASA Grade Assessment: II - A patient with                            mild systemic disease. After reviewing the risks                            and benefits, the patient was deemed in                            satisfactory condition to undergo the procedure.                           After obtaining informed consent, the endoscope was  passed under direct vision. Throughout the                            procedure, the patient's blood pressure, pulse, and                            oxygen saturations were monitored continuously. The                            Endoscope was introduced through the mouth, and                            advanced to the second part of duodenum. The upper                            GI  endoscopy was accomplished without difficulty.                            The patient tolerated the procedure well. Scope In: Scope Out: Findings:                 No endoscopic abnormality was evident in the                            esophagus to explain the patient's complaint of                            dysphagia. It was decided, however, to proceed with                            dilation of the entire esophagus. A guidewire was                            placed and the scope was withdrawn. Dilation was                            performed with a Savary dilator with no resistance                            at 17 mm.                           A small hiatal hernia was present.                           The exam of the stomach was otherwise normal.                           The duodenal bulb and second portion of the                            duodenum were normal. Complications:            No immediate complications. Estimated Blood Loss:     Estimated blood loss: none. Impression:               -  No endoscopic esophageal abnormality to explain                            patient's dysphagia. Esophagus dilated. Dilated.                           - Small hiatal hernia.                           - Normal duodenal bulb and second portion of the                            duodenum.                           - No specimens collected. Recommendation:           - Patient has a contact number available for                            emergencies. The signs and symptoms of potential                            delayed complications were discussed with the                            patient. Return to normal activities tomorrow.                            Written discharge instructions were provided to the                            patient.                           - Clear liquid diet for 2 hours, then advance as                            tolerated to soft diet today.                            - Continue present medications.                           - FDgard 1-2 po tid prn pain                           - Proceed with abdominal US as scheduled. Meryl DareMalcolm T , MD 12/06/2018 1:48:04 PM This report has been signed electronically.

## 2018-12-06 NOTE — Progress Notes (Signed)
Called to room to assist during endoscopic procedure.  Patient ID and intended procedure confirmed with present staff. Received instructions for my participation in the procedure from the performing physician.  

## 2018-12-06 NOTE — Progress Notes (Signed)
Report to PACU, RN, vss, BBS= Clear.  

## 2018-12-07 ENCOUNTER — Telehealth: Payer: Self-pay

## 2018-12-07 NOTE — Telephone Encounter (Signed)
Left message on f/u call 

## 2018-12-07 NOTE — Telephone Encounter (Signed)
Second follow up phone call attempt, no answer, message left 

## 2018-12-08 ENCOUNTER — Ambulatory Visit (HOSPITAL_COMMUNITY): Payer: No Typology Code available for payment source

## 2018-12-08 ENCOUNTER — Ambulatory Visit (HOSPITAL_COMMUNITY)
Admission: RE | Admit: 2018-12-08 | Discharge: 2018-12-08 | Disposition: A | Discharge status: Home / Self Care | Payer: No Typology Code available for payment source | Source: Ambulatory Visit | Attending: Gastroenterology | Admitting: Gastroenterology

## 2018-12-08 DIAGNOSIS — R1012 Left upper quadrant pain: Secondary | ICD-10-CM | POA: Diagnosis present

## 2018-12-15 ENCOUNTER — Other Ambulatory Visit: Payer: Self-pay | Admitting: Nurse Practitioner

## 2018-12-15 DIAGNOSIS — F329 Major depressive disorder, single episode, unspecified: Secondary | ICD-10-CM

## 2018-12-15 DIAGNOSIS — F419 Anxiety disorder, unspecified: Principal | ICD-10-CM

## 2018-12-15 MED FILL — SERTRALINE HCL 100 MG TAB: 100 | 90 days supply | Qty: 135 | Fill #0

## 2018-12-26 MED FILL — PREGABALIN 75 MG CAPS: 75 | 30 days supply | Qty: 60 | Fill #2

## 2019-01-01 MED FILL — VALACYCLOVIR HCL 500 MG TAB: 500 | 30 days supply | Qty: 33 | Fill #0

## 2019-01-10 ENCOUNTER — Encounter: Payer: Self-pay | Admitting: Family Medicine

## 2019-01-30 ENCOUNTER — Other Ambulatory Visit: Payer: Self-pay | Admitting: Nurse Practitioner

## 2019-01-30 DIAGNOSIS — G894 Chronic pain syndrome: Secondary | ICD-10-CM

## 2019-01-30 MED FILL — PREGABALIN 75 MG CAPS: 75 | 30 days supply | Qty: 60 | Fill #0

## 2019-01-30 NOTE — Telephone Encounter (Signed)
It looks like she is planning to change to Dr. Ashley Royalty at [facility]. I will refill the Lyrica x 2 months to give her time to get established over there.  Also, I don't think she can pick up a prescription at [facility]. I think they will mail it to her.

## 2019-01-30 NOTE — Telephone Encounter (Signed)
[facility] Controlled Database Checked Last filled:12/25/18 # 60 LOV w/PCP: 11/22/18 Next appt w/PCP: None

## 2019-01-30 NOTE — Telephone Encounter (Signed)
Left detailed message informing pt of below.  

## 2019-03-08 MED FILL — PREGABALIN 75 MG CAPS: 75 | 30 days supply | Qty: 60 | Fill #1

## 2019-03-08 MED FILL — SERTRALINE HCL 100 MG TAB: 100 | 90 days supply | Qty: 135 | Fill #1

## 2019-04-06 ENCOUNTER — Ambulatory Visit (INDEPENDENT_AMBULATORY_CARE_PROVIDER_SITE_OTHER): Payer: No Typology Code available for payment source | Admitting: Family Medicine

## 2019-04-06 DIAGNOSIS — G5701 Lesion of sciatic nerve, right lower limb: Secondary | ICD-10-CM | POA: Diagnosis not present

## 2019-04-06 MED ORDER — PREDNISONE 5 MG PO TABS: ORAL_TABLET | ORAL | 0 refills | Status: DC

## 2019-04-06 MED ORDER — CYCLOBENZAPRINE HCL 10 MG PO TABS: 5.0000 mg | ORAL_TABLET | Freq: Three times a day (TID) | ORAL | 0 refills | Status: DC | PRN

## 2019-04-06 MED FILL — CYCLOBENZAPRINE HCL 10 MG T: 10 | 20 days supply | Qty: 60 | Fill #0

## 2019-04-06 MED FILL — predniSONE 5 MG (21) TBPK: 5 | 6 days supply | Qty: 21 | Fill #0

## 2019-04-06 NOTE — Progress Notes (Signed)
Virtual Visit via Video Note  I connected with [name] on 04/06/19 at  3:40 PM EDT by a video enabled telemedicine application and verified that I am speaking with the correct person using two identifiers.   I discussed the limitations of evaluation and management by telemedicine and the availability of in person appointments. The patient expressed understanding and agreed to proceed.  History of Present Illness: Pain originates over the sacrum and radiates laterally to the greater trochanter.  She also feels some numbness tingling down the lateral aspect of the upper thigh. Has been occurring for 2 weeks She has been walking an hour work and that seems to exacerbate the pain. The pain seems to be constant with no relief to date. Has been taking 800 mg ibuprofen with limited improvement. Radicular symptoms It is worse with bending over Pain is worse at the end of day    Observations/Objective:  Gen: NAD, alert, cooperative with exam, well-appearing ENT: normal lips, normal nasal mucosa,  Eye: normal EOM, normal conjunctiva and lids  Resp: no accessory muscle use, non-labored,  Skin: no rashes,  Psych:  normal insight, alert and oriented    Assessment and Plan:  Pain seems to be occurring over the piriformis.  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  No injury occurring.  Likely having some subluxations of the joint with fatigue of the muscles.  May have some irritation of the lateral femoral cutaneous or of the L2. -Prednisone. -Flexeril -Counseled on home exercise therapy and supportive care. -If no improvement may need to consider injections.  Follow Up Instructions:    I discussed the assessment and treatment plan with the patient. The patient was provided an opportunity to ask questions and all were answered. The patient agreed with the plan and demonstrated an understanding of the instructions.   The patient was advised to call back or seek an  in-person evaluation if the symptoms worsen or if the condition fails to improve as anticipated.    Clare Gandy, MD

## 2019-04-06 NOTE — Assessment & Plan Note (Signed)
Pain seems to be occurring over the piriformis.  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  No injury occurring.  Likely having some subluxations of the joint with fatigue of the muscles.  May have some irritation of the lateral femoral cutaneous or of the L2. -Prednisone. -Flexeril -Counseled on home exercise therapy and supportive care. -If no improvement may need to consider injections.

## 2019-04-17 ENCOUNTER — Other Ambulatory Visit: Payer: Self-pay | Admitting: Family

## 2019-04-17 DIAGNOSIS — G894 Chronic pain syndrome: Secondary | ICD-10-CM

## 2019-04-17 MED FILL — PREGABALIN 75 MG CAPS: 75 | 30 days supply | Qty: 60 | Fill #0

## 2019-04-17 NOTE — Telephone Encounter (Signed)
It looks like she was planning to transfer to Dr. Zigmund Daniel at [facility]. We will give short term refill but needs to go ahead and establish care with him.

## 2019-04-17 NOTE — Telephone Encounter (Signed)
Sent patient my-chart message today regarding short-term refill and to establish with Dr. Zigmund Daniel.

## 2019-04-17 NOTE — Telephone Encounter (Signed)
Message read by patient today.

## 2019-04-30 ENCOUNTER — Telehealth: Payer: Self-pay | Admitting: Behavioral Health

## 2019-04-30 NOTE — Telephone Encounter (Signed)

## 2019-05-01 ENCOUNTER — Encounter: Payer: Self-pay | Admitting: Family Medicine

## 2019-05-01 ENCOUNTER — Ambulatory Visit (INDEPENDENT_AMBULATORY_CARE_PROVIDER_SITE_OTHER): Payer: No Typology Code available for payment source | Admitting: Family Medicine

## 2019-05-01 DIAGNOSIS — E282 Polycystic ovarian syndrome: Secondary | ICD-10-CM

## 2019-05-01 DIAGNOSIS — F329 Major depressive disorder, single episode, unspecified: Secondary | ICD-10-CM

## 2019-05-01 DIAGNOSIS — M797 Fibromyalgia: Secondary | ICD-10-CM | POA: Diagnosis not present

## 2019-05-01 DIAGNOSIS — M357 Hypermobility syndrome: Secondary | ICD-10-CM

## 2019-05-01 DIAGNOSIS — F419 Anxiety disorder, unspecified: Secondary | ICD-10-CM | POA: Diagnosis not present

## 2019-05-01 DIAGNOSIS — G894 Chronic pain syndrome: Secondary | ICD-10-CM | POA: Diagnosis not present

## 2019-05-01 MED ORDER — ALPRAZOLAM 0.5 MG PO TABS: 0.5000 mg | ORAL_TABLET | Freq: Every day | ORAL | 1 refills | Status: DC | PRN

## 2019-05-01 MED ORDER — PREGABALIN 100 MG PO CAPS: 100.0000 mg | ORAL_CAPSULE | Freq: Two times a day (BID) | ORAL | 4 refills | Status: DC

## 2019-05-01 MED FILL — PREGABALIN 100 MG CAPS: 100 | 30 days supply | Qty: 60 | Fill #0

## 2019-05-01 MED FILL — ALPRAZolam 0.5 MG TABS: 0.5 | 20 days supply | Qty: 20 | Fill #0

## 2019-05-01 NOTE — Assessment & Plan Note (Signed)
-  Will see if increasing lyrica to 100mg  BID works better to manage her symptoms.  -Encouraged to continue regular exercise.

## 2019-05-01 NOTE — Patient Instructions (Signed)
Great to meet you! I have increased your lyrica to 100mg  twice per day, keep me updated on how this is working for you.  I will plan to see you back for your annual exam in October.

## 2019-05-01 NOTE — Assessment & Plan Note (Signed)
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  Has tried PT and dry needling in the past with temporary improvement.   -Continue regular exercise and NSAID prn.  -Recommend seeing Dr. Tamala Julian for OMT.

## 2019-05-01 NOTE — Assessment & Plan Note (Signed)
-  Followed by ob-gyn.  -Update blood glucose at CPE in October.

## 2019-05-01 NOTE — Assessment & Plan Note (Signed)
-  Stable with sertraline daily and alprazolam on an as needed basis for panic.  She is rarely taking alprazolam.

## 2019-05-01 NOTE — Progress Notes (Signed)
Angela [name] - 38 y.o. female MRN [id number]  Date of birth: 04/25/1981  Subjective Chief Complaint  Patient presents with  . Establish Care    TOC, Rx refill,     HPI Angela [name] is a 38 y.o. female with history of PCOS, fibromyalgia, anxiety with panic disorder and hypermobility syndrome.    -Fibromyalgia:  Has complaint of chronic joint and muscle pain in hands, shoulders and lower back.  Had work up for inflammatory/auto-immune disorders that were negative.  Diagnosed with FM by neurology and started on lyrica.  Continues to have pain daily.  She is taking 800mg  of ibuprofen daily as well which is somewhat helpful.  She exercises regularly which may or may not worsen pain depending on the day.  She has seen Dr. Raeford Razor in sports medicine previously as well and diagnosed with hypermobility syndrome.  She is not sure if she has tried cymbalta but anxiety is currently stable with sertraline.   -Anxiety with panic disorder:  Current treatment with sertraline with rare use of alprazolam as needed for panic disorder.  Symptoms stable with current medications.    -PCOS:  Weight stable, exercises frequently.  She has family history of T2DM, blood sugars have been normal.   ROS:  A comprehensive ROS was completed and negative except as noted per HPI  No Known Allergies  Past Medical History:  Diagnosis Date  . Abnormal Pap smear    repeat ok  . Fibromyalgia 09/02/2017  . Herpes zoster   . Panic attacks   . PCOS (polycystic ovarian syndrome)    conceived on metformin and clomid  . PP SVD 12/02/11 M 12/03/2011    Past Surgical History:  Procedure Laterality Date  . BREAST BIOPSY Right    Benign  . WISDOM TOOTH EXTRACTION      Social History   Socioeconomic History  . Marital status: Married    Spouse name: Not on file  . Number of children: 1  . Years of education: 41  . Highest education level: Not on file  Occupational History  . Occupation: Facilities manager   Social Needs  . Financial resource strain: Not hard at all  . Food insecurity    Worry: Never true    Inability: Never true  . Transportation needs    Medical: No    Non-medical: No  Tobacco Use  . Smoking status: Former Smoker    Quit date: 11/01/2009    Years since quitting: 9.5  . Smokeless tobacco: Never Used  Substance and Sexual Activity  . Alcohol use: Yes    Alcohol/week: 2.0 standard drinks    Types: 2 Cans of beer per week  . Drug use: No  . Sexual activity: Yes    Partners: Male    Birth control/protection: I.U.D.  Lifestyle  . Physical activity    Days per week: Not on file    Minutes per session: Not on file  . Stress: Only a little  Relationships  . Social Herbalist on phone: Three times a week    Gets together: Three times a week    Attends religious service: Not on file    Active member of club or organization: Not on file    Attends meetings of clubs or organizations: Not on file    Relationship status: Married  Other Topics Concern  . 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  Denies any religious beliefs effecting health care.    Lives with husband and son in a 2 story home.     Education: college.     Family History  Problem Relation Age of Onset  . Diabetes Father        uncontrolled  . Hypertension Father   . Hyperlipidemia Father   . Alcohol abuse Other   . Arthritis Other   . Ovarian cancer Paternal Grandmother   . Pancreatic cancer Cousin        paternal 2nd cousin  . Anesthesia problems Neg Hx     Health Maintenance  Topic Date Due  . INFLUENZA VACCINE  06/02/2019  . PAP SMEAR-Modifier  07/03/2019  . TETANUS/TDAP  08/18/2026  . HIV Screening  Completed     ----------------------------------------------------------------------------------------------------------------------------------------------------------------------------------------------------------------- Physical Exam BP 98/64   Pulse (!) 103   Temp 98.3 F (36.8 C) (Oral)   Resp 16   Ht 5\' 5"  (1.651 m)   Wt 137 lb 12.8 oz (62.5 kg)   SpO2 98%   BMI 22.93 kg/m   Physical Exam Constitutional:      Appearance: Normal appearance.  HENT:     Head: Normocephalic and atraumatic.  Eyes:     General: No scleral icterus. Cardiovascular:     Rate and Rhythm: Normal rate and regular rhythm.  Pulmonary:     Effort: Pulmonary effort is normal.     Breath sounds: Normal breath sounds.  Musculoskeletal:        General: No swelling or tenderness.     Comments: Able to passively dorsiflex thumb to flexor aspect of forearm and flex trunk fully to allow palms to be flat on the floor.   Skin:    General: Skin is warm and dry.  Neurological:     General: No focal deficit present.     Mental Status: She is alert.  Psychiatric:        Mood and Affect: Mood normal.        Behavior: Behavior normal.     ------------------------------------------------------------------------------------------------------------------------------------------------------------------------------------------------------------------- Assessment and Plan  Hypermobility syndrome -She certainly has features consistent with hypermobility syndrome which may be contributing more to her chronic pain than FM.  Has tried PT and dry needling in the past with temporary improvement.   -Continue regular exercise and NSAID prn.  -Recommend seeing Dr. Katrinka BlazingSmith for OMT.    Fibromyalgia -Will see if increasing lyrica to 100mg  BID works better to manage her symptoms.  -Encouraged to continue regular exercise.   Anxiety and depression -Stable with sertraline daily and alprazolam on an as needed basis for panic.  She is rarely  taking alprazolam.   POLYCYSTIC OVARIAN DISEASE -Followed by ob-gyn.  -Update blood glucose at CPE in October.

## 2019-05-03 ENCOUNTER — Encounter: Payer: Self-pay | Admitting: Family Medicine

## 2019-05-03 ENCOUNTER — Other Ambulatory Visit: Payer: Self-pay | Admitting: Family Medicine

## 2019-05-03 DIAGNOSIS — M797 Fibromyalgia: Secondary | ICD-10-CM

## 2019-05-03 DIAGNOSIS — M357 Hypermobility syndrome: Secondary | ICD-10-CM

## 2019-05-14 ENCOUNTER — Ambulatory Visit: Payer: No Typology Code available for payment source | Admitting: Family Medicine

## 2019-05-15 ENCOUNTER — Encounter: Payer: Self-pay | Admitting: Family Medicine

## 2019-05-15 ENCOUNTER — Other Ambulatory Visit: Payer: Self-pay

## 2019-05-15 ENCOUNTER — Ambulatory Visit (INDEPENDENT_AMBULATORY_CARE_PROVIDER_SITE_OTHER): Payer: No Typology Code available for payment source | Admitting: Family Medicine

## 2019-05-15 DIAGNOSIS — M357 Hypermobility syndrome: Secondary | ICD-10-CM | POA: Diagnosis not present

## 2019-05-15 DIAGNOSIS — M999 Biomechanical lesion, unspecified: Secondary | ICD-10-CM | POA: Diagnosis not present

## 2019-05-15 MED ORDER — VITAMIN D (ERGOCALCIFEROL) 1.25 MG (50000 UNIT) PO CAPS: 50000.0000 [IU] | ORAL_CAPSULE | ORAL | 0 refills | Status: DC

## 2019-05-15 MED FILL — VIT D2 1.25 MG (50,000 UNIT: 1.25 MG | 84 days supply | Qty: 12 | Fill #0

## 2019-05-15 NOTE — Assessment & Plan Note (Signed)
Decision today to treat with OMT was based on Physical Exam  After verbal consent patient was treated with HVLA, ME, FPR techniques in , thoracic, lumbar and sacral areas  Patient tolerated the procedure well with improvement in symptoms  Patient given exercises, stretches and lifestyle modifications  See medications in patient instructions if given  Patient will follow up in 3-6 weeks 

## 2019-05-15 NOTE — Progress Notes (Signed)
Angela ScaleZach Johnnell [name] D.O. Angela [name] [address] [place], [place] [address] Phone: [phone] Subjective:   I Angela NighKana Thompson am serving as a Neurosurgeonscribe for Dr. Antoine PrimasZachary Milta [name].  I'm seeing this patient by the request  of:    CC: Low back pain  Angela [name]:[id number]:Subjective  Angela DillonHeather L [name] is a 38 y.o. female coming in with complaint of sacral pain. Pain radiates to the right hip. Has seen Angela LikesSchmitz for injections.   Onset- Chronic  Location - lower back, sacrum, piriformis Duration-  Character- dull, achy, constant pain  Aggravating factors- walking up and down hill mostly down hill  Reliving factors-ice sometimes  Therapies tried- ice, heat, oral medications (flexerill, ibprofen)  Severity-8 out of 10 in severity at its worst     Past Medical History:  Diagnosis Date  . Abnormal Pap smear    repeat ok  . Fibromyalgia 09/02/2017  . Herpes zoster   . Panic attacks   . PCOS (polycystic ovarian syndrome)    conceived on metformin and clomid  . PP SVD 12/02/11 M 12/03/2011   Past Surgical History:  Procedure Laterality Date  . BREAST BIOPSY Right    Benign  . WISDOM TOOTH EXTRACTION     Social History   Socioeconomic History  . Marital status: Married    Spouse name: Not on file  . Number of children: 1  . Years of education: 4616  . Highest education level: Not on file  Occupational History  . Occupation: Systems developeradiation Therapist  Social Needs  . Financial resource strain: Not hard at all  . Food insecurity    Worry: Never true    Inability: Never true  . Transportation needs    Medical: No    Non-medical: No  Tobacco Use  . Smoking status: Former Smoker    Quit date: 11/01/2009    Years since quitting: 9.5  . Smokeless tobacco: Never Used  Substance and Sexual Activity  . Alcohol use: Yes    Alcohol/week: 2.0 standard drinks    Types: 2 Cans of beer per week  . Drug use: No  . Sexual activity: Yes    Partners: Male    Birth control/protection: I.U.D.  Lifestyle  .  Physical activity    Days per week: Not on file    Minutes per session: Not on file  . Stress: Only a little  Relationships  . Social Musicianconnections    Talks on phone: Three times a week    Gets together: Three times a week    Attends religious service: Not on file    Active member of club or organization: Not on file    Attends meetings of clubs or organizations: Not on file    Relationship status: Married  Other Topics Concern  . Not on file  Social History Narrative   Works as a Systems developerradiation therapist; Fun: Angela RadHang out with family.    Denies any religious beliefs effecting health care.    Lives with husband and son in a 2 story home.     Education: college.    No Known Allergies Family History  Problem Relation Age of Onset  . Diabetes Father        uncontrolled  . Hypertension Father   . Hyperlipidemia Father   . Alcohol abuse Other   . Arthritis Other   . Ovarian cancer Paternal Grandmother   . Pancreatic cancer Cousin        paternal 2nd cousin  . Anesthesia problems Neg Hx  Current Outpatient Medications (Analgesics):  Marland Kitchen  Ibuprofen-Famotidine 800-26.6 MG TABS, Take 1 tablet by mouth 3 (three) times daily as needed.   Current Outpatient Medications (Other):  Marland Kitchen  ALPRAZolam (XANAX) 0.5 MG tablet, Take 1 tablet (0.5 mg total) by mouth daily as needed for anxiety or sleep. .  cyclobenzaprine (FLEXERIL) 10 MG tablet, Take 0.5-1 tablets (5-10 mg total) by mouth 3 (three) times daily as needed for muscle spasms. .  Multiple Vitamin (MULTIVITAMIN WITH MINERALS) TABS tablet, Take 1 tablet by mouth daily. .  pregabalin (LYRICA) 100 MG capsule, Take 1 capsule (100 mg total) by mouth 2 (two) times daily. .  Probiotic Product (PROBIOTIC ADVANCED PO), Take by mouth daily.  .  sertraline (ZOLOFT) 100 MG tablet, TAKE 1.5 TABLETS (150 MG TOTAL) BY MOUTH DAILY. .  valACYclovir (VALTREX) 500 MG tablet, Take 500 mg by mouth as needed for lip care. .  Vitamin D, Ergocalciferol,  (DRISDOL) 1.25 MG (50000 UT) CAPS capsule, Take 1 capsule (50,000 Units total) by mouth every 7 (seven) days.    Past medical history, social, surgical and family history all reviewed in electronic medical record.  No pertanent information unless stated regarding to the chief complaint.   Review of Systems:  No headache, visual changes, nausea, vomiting, diarrhea, constipation, dizziness, abdominal pain, skin rash, fevers, chills, night sweats, weight loss, swollen lymph nodes, body aches, joint swelling, chest pain, shortness of breath, mood changes.  Positive muscle aches  Objective  Blood pressure 140/80, pulse (!) 117, height 5\' 5"  (1.651 m), weight 138 lb (62.6 kg), SpO2 98 %.    General: No apparent distress alert and oriented x3 mood and affect normal, dressed appropriately.  HEENT: Pupils equal, extraocular movements intact  Respiratory: Patient's speak in full sentences and does not appear short of breath  Cardiovascular: No lower extremity edema, non tender, no erythema  Skin: Warm dry intact with no signs of infection or rash on extremities or on axial skeleton.  Abdomen: Soft nontender  Neuro: Cranial nerves II through XII are intact, neurovascularly intact in all extremities with 2+ DTRs and 2+ pulses.  Lymph: No lymphadenopathy of posterior or anterior cervical chain or axillae bilaterally.  Gait normal with good balance and coordination.  MSK:  Non tender with full range of motion and good stability and symmetric strength and tone of shoulders, elbows, wrist, hip, knee and ankles bilaterally. Severe hypermobility  Low back pain noted around the sacroiliac joints bilaterally.  Full range of motion with hyperextension of the back noted.  Tender to palpation also around the piriformis bilaterally.  Negative straight leg test.  Neurovascular intact distally with 5 out of 5 strength.  Osteopathic findings  T3 extended rotated and side bent right inhaled third rib T7 extended  rotated and side bent left L2 flexed rotated and side bent right Sacrum right on right      Impression and Recommendations:     This case required medical decision making of moderate complexity. The above documentation has been reviewed and is accurate and complete Angela Pulley, DO       Note: This dictation was prepared with Dragon dictation along with smaller phrase technology. Any transcriptional errors that result from this process are unintentional.

## 2019-05-15 NOTE — Patient Instructions (Signed)
Good to see you Once weekly vitamin D for 12 weeks.  Turmeric 500mg  daily  Calcium pyruvate 1500 mg daily See me again in 3-4 weeks

## 2019-05-15 NOTE — Assessment & Plan Note (Signed)
Patient.  Dysfunction.  Discussed anti-inflammatories, patient says she discussed her current 92s and home exercise.  Follow-up again in 4 to 8 weeks

## 2019-06-05 ENCOUNTER — Ambulatory Visit: Payer: No Typology Code available for payment source | Admitting: Family Medicine

## 2019-06-14 MED FILL — SERTRALINE HCL 100 MG TAB: 100 | 90 days supply | Qty: 135 | Fill #2

## 2019-06-16 NOTE — Progress Notes (Signed)
Tawana ScaleZach Dusty [name] D.O. [facility] [address] [place], [place] [address] Phone: [phone] Subjective:   Angela [name], Angela [name], am serving as a scribe for Dr. Antoine PrimasZachary Alithea [name].  I'm seeing this patient by the request  of:  Everrett CoombeMatthews, Cody, DO   CC: Piriformis syndrome follow-up and hypermobility follow-up  UXL:[id number]:Subjective  Angela DillonHeather L [name] is a 38 y.o. female coming in with complaint of neck and upper back pain. Patient feels like her pain has improved by 80% in the upper back. Does have constant pain in right hip in the glute. Intermittent flares. Has been doing HEP. Does use 800mg  of IBU daily. Has not been using Vitamin D. Ising turmeric and calcium pyruvate.   Also complains of pain over medial tibia. Pain has increased since she started walking for 3 miles at lunch. Is using compression stockings. Did get some new shoes. Great toe on right foot is numb.      Past Medical History:  Diagnosis Date  . Abnormal Pap smear    repeat ok  . Fibromyalgia 09/02/2017  . Herpes zoster   . Panic attacks   . PCOS (polycystic ovarian syndrome)    conceived on metformin and clomid  . PP SVD 12/02/11 M 12/03/2011   Past Surgical History:  Procedure Laterality Date  . BREAST BIOPSY Right    Benign  . WISDOM TOOTH EXTRACTION     Social History   Socioeconomic History  . Marital status: Married    Spouse name: Not on file  . Number of children: 1  . Years of education: 8116  . Highest education level: Not on file  Occupational History  . Occupation: Systems developeradiation Therapist  Social Needs  . Financial resource strain: Not hard at all  . Food insecurity    Worry: Never true    Inability: Never true  . Transportation needs    Medical: No    Non-medical: No  Tobacco Use  . Smoking status: Former Smoker    Quit date: 11/01/2009    Years since quitting: 9.6  . Smokeless tobacco: Never Used  Substance and Sexual Activity  . Alcohol use: Yes    Alcohol/week: 2.0 standard drinks    Types: 2  Cans of beer per week  . Drug use: No  . Sexual activity: Yes    Partners: Male    Birth control/protection: I.U.D.  Lifestyle  . Physical activity    Days per week: Not on file    Minutes per session: Not on file  . Stress: Only a little  Relationships  . Social Musicianconnections    Talks on phone: Three times a week    Gets together: Three times a week    Attends religious service: Not on file    Active member of club or organization: Not on file    Attends meetings of clubs or organizations: Not on file    Relationship status: Married  Other Topics Concern  . 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    Denies any religious beliefs effecting health care.    Lives with husband and son in a 2 story home.     Education: college.    No Known Allergies Family History  Problem Relation Age of Onset  . Diabetes Father        uncontrolled  . Hypertension Father   . Hyperlipidemia Father   . Alcohol abuse Other   . Arthritis  Other   . Ovarian cancer Paternal Grandmother   . Pancreatic cancer Cousin        paternal 2nd cousin  . Anesthesia problems Neg Hx        Current Outpatient Medications (Analgesics):  Marland Kitchen  Ibuprofen-Famotidine 800-26.6 MG TABS, Take 1 tablet by mouth 3 (three) times daily as needed.   Current Outpatient Medications (Other):  Marland Kitchen  ALPRAZolam (XANAX) 0.5 MG tablet, Take 1 tablet (0.5 mg total) by mouth daily as needed for anxiety or sleep. .  cyclobenzaprine (FLEXERIL) 10 MG tablet, Take 0.5-1 tablets (5-10 mg total) by mouth 3 (three) times daily as needed for muscle spasms. .  Multiple Vitamin (MULTIVITAMIN WITH MINERALS) TABS tablet, Take 1 tablet by mouth daily. .  pregabalin (LYRICA) 100 MG capsule, Take 1 capsule (100 mg total) by mouth 2 (two) times daily. .  Probiotic Product (PROBIOTIC ADVANCED PO), Take by mouth daily.  .  sertraline (ZOLOFT) 100 MG tablet, TAKE 1.5 TABLETS (150 MG TOTAL) BY  MOUTH DAILY. .  valACYclovir (VALTREX) 500 MG tablet, Take 500 mg by mouth as needed for lip care. .  Vitamin D, Ergocalciferol, (DRISDOL) 1.25 MG (50000 UT) CAPS capsule, Take 1 capsule (50,000 Units total) by mouth every 7 (seven) days. .  Vitamin D, Ergocalciferol, (DRISDOL) 1.25 MG (50000 UT) CAPS capsule, Take 1 capsule (50,000 Units total) by mouth every 7 (seven) days.    Past medical history, social, surgical and family history all reviewed in electronic medical record.  No pertanent information unless stated regarding to the chief complaint.   Review of Systems:  No headache, visual changes, nausea, vomiting, diarrhea, constipation, dizziness, abdominal pain, skin rash, fevers, chills, night sweats, weight loss, swollen lymph nodes, body aches, joint swelling,  chest pain, shortness of breath, mood changes.  Positive muscle aches  Objective  Blood pressure 120/80, pulse (!) 106, height 5\' 5"  (1.651 m), weight 139 lb (63 kg), SpO2 98 %.    General: No apparent distress alert and oriented x3 mood and affect normal, dressed appropriately.  HEENT: Pupils equal, extraocular movements intact  Respiratory: Patient's speak in full sentences and does not appear short of breath  Cardiovascular: No lower extremity edema, non tender, no erythema  Skin: Warm dry intact with no signs of infection or rash on extremities or on axial skeleton.  Abdomen: Soft nontender  Neuro: Cranial nerves II through XII are intact, neurovascularly intact in all extremities with 2+ DTRs and 2+ pulses.  Lymph: No lymphadenopathy of posterior or anterior cervical chain or axillae bilaterally.  Gait normal with good balance and coordination.  MSK:  Non tender with full range of motion and good stability and symmetric strength and tone of shoulders, elbows, wrist, hip, knee and ankles bilaterally.  Back Exam:  Inspection: Unremarkable  Motion: Flexion 35 deg, Extension 20 deg, Side Bending to 45 deg bilaterally,   Rotation to 45 deg bilaterally  SLR laying: Negative  XSLR laying: Negative  Palpable tenderness: Tender to palpation more over the gluteal area on the right side of the piriformis pain. FABER: Positive right. Sensory change: Gross sensation intact to all lumbar and sacral dermatomes.  Reflexes: 2+ at both patellar tendons, 2+ at achilles tendons, Babinski's downgoing.  Strength at foot  Plantar-flexion: 5/5 Dorsi-flexion: 5/5 Eversion: 5/5 Inversion: 5/5  Leg strength  Quad: 5/5 Hamstring: 5/5 Hip flexor: 5/5 Hip abductors: 5/5  Gait unremarkable.  Osteopathic findings  C2 flexed rotated and side bent righ C6 flexed rotated and side bent  left T3 extended rotated and side bent right inhaled third rib T9 extended rotated and side bent left L2 flexed rotated and side bent right Sacrum right on right  Procedure: Real-time Ultrasound Guided Injection of right piriformis tendon sheath Device: GE Logiq Q7 Ultrasound guided injection is preferred based studies that show increased duration, increased effect, greater accuracy, decreased procedural pain, increased response rate, and decreased cost with ultrasound guided versus blind injection.  Verbal informed consent obtained.  Time-out conducted.  Noted no overlying erythema, induration, or other signs of local infection.  Skin prepped in a sterile fashion.  Local anesthesia: Topical Ethyl chloride.  With sterile technique and under real time ultrasound guidance: With a 21-gauge 2 f inch needle injected with 0.5 cc of 0.5% Marcaine and 1 cc of Kenalog 40 mg/mL Completed without difficulty  Pain immediately resolved suggesting accurate placement of the medication.  Advised to call if fevers/chills, erythema, induration, drainage, or persistent bleeding.  Images permanently stored and available for review in the ultrasound unit.  Impression: Technically successful ultrasound guided injection.    Impression and Recommendations:      This case required medical decision making of moderate complexity. The above documentation has been reviewed and is accurate and complete Judi SaaZachary M Jamielynn Wigley, DO       Note: This dictation was prepared with Dragon dictation along with smaller phrase technology. Any transcriptional errors that result from this process are unintentional.

## 2019-06-16 NOTE — Assessment & Plan Note (Signed)
Decision today to treat with OMT was based on Physical Exam  After verbal consent patient was treated with HVLA, ME, FPR techniques in cervical, thoracic, lumbar and sacral areas  Patient tolerated the procedure well with improvement in symptoms  Patient given exercises, stretches and lifestyle modifications  See medications in patient instructions if given  Patient will follow up in 6-8 weeks 

## 2019-06-18 ENCOUNTER — Ambulatory Visit: Payer: Self-pay

## 2019-06-18 ENCOUNTER — Ambulatory Visit (INDEPENDENT_AMBULATORY_CARE_PROVIDER_SITE_OTHER): Payer: No Typology Code available for payment source | Admitting: Family Medicine

## 2019-06-18 ENCOUNTER — Other Ambulatory Visit: Payer: Self-pay

## 2019-06-18 ENCOUNTER — Encounter: Payer: Self-pay | Admitting: Family Medicine

## 2019-06-18 VITALS — BP 120/80 | HR 106 | Ht 65.0 in | Wt 139.0 lb

## 2019-06-18 DIAGNOSIS — G5701 Lesion of sciatic nerve, right lower limb: Secondary | ICD-10-CM

## 2019-06-18 DIAGNOSIS — M25551 Pain in right hip: Secondary | ICD-10-CM

## 2019-06-18 DIAGNOSIS — M999 Biomechanical lesion, unspecified: Secondary | ICD-10-CM

## 2019-06-18 MED ORDER — VITAMIN D (ERGOCALCIFEROL) 1.25 MG (50000 UNIT) PO CAPS: 50000.0000 [IU] | ORAL_CAPSULE | ORAL | 0 refills | Status: DC

## 2019-06-18 NOTE — Patient Instructions (Addendum)
Injected piriformis Will get custom orthotics Exercises 3x a week Read about Effexor Avoid walking barefoot Vitamin D Refilled See me in 4-8 weeks

## 2019-06-18 NOTE — Assessment & Plan Note (Signed)
Patient given injection and tolerated the procedure well.  Discussed icing regimen and home exercises.  Which activities of doing which wants to avoid.  Increase activity slowly.  Follow-up again in 4 to 8 weeks

## 2019-06-19 MED FILL — VIT D2 1.25 MG (50,000 UNIT: 1.25 MG | 84 days supply | Qty: 12 | Fill #0

## 2019-06-20 ENCOUNTER — Telehealth: Payer: Self-pay

## 2019-06-20 NOTE — Telephone Encounter (Signed)
Called patient to schedule orthotics appointment. Left a message.

## 2019-06-21 ENCOUNTER — Telehealth: Payer: Self-pay

## 2019-06-21 NOTE — Telephone Encounter (Signed)
Patient called and scheduled orthotics visit.

## 2019-06-25 MED FILL — PREGABALIN 100 MG CAPS: 100 | 30 days supply | Qty: 60 | Fill #1

## 2019-07-05 ENCOUNTER — Ambulatory Visit: Payer: No Typology Code available for payment source | Admitting: Family Medicine

## 2019-07-19 ENCOUNTER — Other Ambulatory Visit: Payer: Self-pay

## 2019-07-19 ENCOUNTER — Encounter: Payer: Self-pay | Admitting: Family Medicine

## 2019-07-19 ENCOUNTER — Ambulatory Visit: Payer: No Typology Code available for payment source | Admitting: Family Medicine

## 2019-07-19 VITALS — BP 110/60 | HR 111 | Ht 66.5 in

## 2019-07-19 DIAGNOSIS — M999 Biomechanical lesion, unspecified: Secondary | ICD-10-CM

## 2019-07-19 DIAGNOSIS — G5701 Lesion of sciatic nerve, right lower limb: Secondary | ICD-10-CM

## 2019-07-19 MED ORDER — VENLAFAXINE HCL ER 37.5 MG PO CP24: 37.5000 mg | ORAL_CAPSULE | Freq: Every day | ORAL | 0 refills | Status: DC

## 2019-07-19 MED FILL — VENLAFAXINE HCL ER 37.5 MG: 37.5 | 30 days supply | Qty: 30 | Fill #0

## 2019-07-19 NOTE — Assessment & Plan Note (Signed)
80% better after the injection.  Making significant progress.  Continues to have fatigue, fibromyalgia and chronic pain syndrome.  Patient wants to change in medications.  We will start a titration for the Lyrica and Zoloft and started on Effexor.  Patient warned of potential side effects and call us symptoms occur.  Discussed icing regimen and home exercise advised.  Responding well to manipulation.  Follow-up again in 4 to 8 weeks

## 2019-07-19 NOTE — Patient Instructions (Signed)
Good to see you  Ice is your friend Keep it up  Lyrica 75mg  2 times a day for 2 weeks Then 75 mg only at night for 2 weeks Zoloft 100mg  daily for a week, then 50 mg daily for a week. Then stop  Start the effexor 37.5 mg daily  See me again in 4 weeks

## 2019-07-19 NOTE — Assessment & Plan Note (Signed)
Decision today to treat with OMT was based on Physical Exam  After verbal consent patient was treated with HVLA, ME, FPR techniques in  thoracic, lumbar and sacral areas  Patient tolerated the procedure well with improvement in symptoms  Patient given exercises, stretches and lifestyle modifications  See medications in patient instructions if given  Patient will follow up in 4-8 weeks 

## 2019-07-19 NOTE — Progress Notes (Signed)
[name] [address], [place] [address] Phone: [phone] Subjective:   I Angela [name] am serving as a Education administrator for Dr. Hulan Saas.    CC: Buttock pain follow-up  UVO:[id number]  Angela [name] is a 38 y.o. female coming in with complaint of glut pain. States she is 80% better. Every so often she gets pain with distant walking. Shin splints are not bothering her anymore. Would like to try the Effexor. States bilaterally her hands and sternum have been bothering her.  Patient is looking to have better control over many different things including her fibromyalgia.  Feels like she is optimistic for the first time in a while     Past Medical History:  Diagnosis Date  . Abnormal Pap smear    repeat ok  . Fibromyalgia 09/02/2017  . Herpes zoster   . Panic attacks   . PCOS (polycystic ovarian syndrome)    conceived on metformin and clomid  . PP SVD 12/02/11 M 12/03/2011   Past Surgical History:  Procedure Laterality Date  . BREAST BIOPSY Right    Benign  . WISDOM TOOTH EXTRACTION     Social History   Socioeconomic History  . Marital status: Married    Spouse name: Not on file  . Number of children: 1  . Years of education: 39  . Highest education level: Not on file  Occupational History  . Occupation: Facilities manager  Social Needs  . Financial resource strain: Not hard at all  . Food insecurity    Worry: Never true    Inability: Never true  . Transportation needs    Medical: No    Non-medical: No  Tobacco Use  . Smoking status: Former Smoker    Quit date: 11/01/2009    Years since quitting: 9.7  . Smokeless tobacco: Never Used  Substance and Sexual Activity  . Alcohol use: Yes    Alcohol/week: 2.0 standard drinks    Types: 2 Cans of beer per week  . Drug use: No  . Sexual activity: Yes    Partners: Male    Birth control/protection: I.U.D.  Lifestyle  . Physical activity    Days per week: Not on file    Minutes  per session: Not on file  . Stress: Only a little  Relationships  . Social Herbalist on phone: Three times a week    Gets together: Three times a week    Attends religious service: Not on file    Active member of club or organization: Not on file    Attends meetings of clubs or organizations: Not on file    Relationship status: Married  Other Topics Concern  . 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    Denies any religious beliefs effecting health care.    Lives with husband and son in a 2 story home.     Education: college.    No Known Allergies Family History  Problem Relation Age of Onset  . Diabetes Father        uncontrolled  . Hypertension Father   . Hyperlipidemia Father   . Alcohol abuse Other   . Arthritis Other   . Ovarian cancer Paternal Grandmother   . Pancreatic cancer Cousin        paternal 2nd cousin  . Anesthesia problems Neg Hx        Current  Outpatient Medications (Analgesics):  Marland Kitchen.  Ibuprofen-Famotidine 800-26.6 MG TABS, Take 1 tablet by mouth 3 (three) times daily as needed.   Current Outpatient Medications (Other):  Marland Kitchen.  ALPRAZolam (XANAX) 0.5 MG tablet, Take 1 tablet (0.5 mg total) by mouth daily as needed for anxiety or sleep. .  cyclobenzaprine (FLEXERIL) 10 MG tablet, Take 0.5-1 tablets (5-10 mg total) by mouth 3 (three) times daily as needed for muscle spasms. .  Multiple Vitamin (MULTIVITAMIN WITH MINERALS) TABS tablet, Take 1 tablet by mouth daily. .  pregabalin (LYRICA) 100 MG capsule, Take 1 capsule (100 mg total) by mouth 2 (two) times daily. .  Probiotic Product (PROBIOTIC ADVANCED PO), Take by mouth daily.  .  sertraline (ZOLOFT) 100 MG tablet, TAKE 1.5 TABLETS (150 MG TOTAL) BY MOUTH DAILY. .  valACYclovir (VALTREX) 500 MG tablet, Take 500 mg by mouth as needed for lip care. .  Vitamin D, Ergocalciferol, (DRISDOL) 1.25 MG (50000 UT) CAPS capsule, Take 1 capsule (50,000  Units total) by mouth every 7 (seven) days. Marland Kitchen.  venlafaxine XR (EFFEXOR XR) 37.5 MG 24 hr capsule, Take 1 capsule (37.5 mg total) by mouth daily with breakfast.    Past medical history, social, surgical and family history all reviewed in electronic medical record.  No pertanent information unless stated regarding to the chief complaint.   Review of Systems:  No headache, visual changes, nausea, vomiting, diarrhea, constipation, dizziness, abdominal pain, skin rash, fevers, chills, night sweats, weight loss, swollen lymph nodes,  chest pain, shortness of breath, mood changes.  Positive muscle aches, body aches  Objective  Blood pressure 110/60, pulse (!) 111, height 5' 6.5" (1.689 m), SpO2 98 %.    General: No apparent distress alert and oriented x3 mood and affect normal, dressed appropriately.  HEENT: Pupils equal, extraocular movements intact  Respiratory: Patient's speak in full sentences and does not appear short of breath  Cardiovascular: No lower extremity edema, non tender, no erythema  Skin: Warm dry intact with no signs of infection or rash on extremities or on axial skeleton.  Abdomen: Soft nontender  Neuro: Cranial nerves II through XII are intact, neurovascularly intact in all extremities with 2+ DTRs and 2+ pulses.  Lymph: No lymphadenopathy of posterior or anterior cervical chain or axillae bilaterally.  Gait normal with good balance and coordination.  MSK:  on tender with full range of motion and good stability and symmetric strength and tone of shoulders, elbows, wrist, hip, knee and ankles bilaterally.  Hypermobility noted Back exam still shows some mild diffuse tenderness in the paraspinal musculature lumbar spine.  Mild tightness of the [facility] test right greater than left.  Patient does have full range of motion with actually extensive range of motion.  Negative straight leg test.   Osteopathic findings  T6 extended rotated and side bent left L1 flexed rotated and side  bent right Sacrum right on right     Impression and Recommendations:     This case required medical decision making of moderate complexity. The above documentation has been reviewed and is accurate and complete Judi SaaZachary M Smith, DO       Note: This dictation was prepared with Dragon dictation along with smaller phrase technology. Any transcriptional errors that result from this process are unintentional.

## 2019-07-20 ENCOUNTER — Encounter: Payer: Self-pay | Admitting: Family Medicine

## 2019-07-22 ENCOUNTER — Encounter: Payer: Self-pay | Admitting: Family Medicine

## 2019-07-22 MED ORDER — PREGABALIN 50 MG PO CAPS: 50.0000 mg | ORAL_CAPSULE | Freq: Two times a day (BID) | ORAL | 1 refills | Status: DC

## 2019-07-23 MED FILL — PREGABALIN 50 MG CAPS: 50 | 30 days supply | Qty: 60 | Fill #0

## 2019-08-01 ENCOUNTER — Encounter: Payer: Self-pay | Admitting: Family Medicine

## 2019-08-01 ENCOUNTER — Ambulatory Visit: Payer: No Typology Code available for payment source | Admitting: Family Medicine

## 2019-08-01 ENCOUNTER — Ambulatory Visit (INDEPENDENT_AMBULATORY_CARE_PROVIDER_SITE_OTHER): Payer: No Typology Code available for payment source | Admitting: Family Medicine

## 2019-08-01 ENCOUNTER — Other Ambulatory Visit: Payer: Self-pay

## 2019-08-01 DIAGNOSIS — R2689 Other abnormalities of gait and mobility: Secondary | ICD-10-CM | POA: Diagnosis not present

## 2019-08-01 MED FILL — CLIND PH-BENZOYL PEROX 1.2-: 1.2-5 | 20 days supply | Qty: 45 | Fill #0

## 2019-08-01 NOTE — Assessment & Plan Note (Signed)
Orthotics made today.  Tolerated well.  Discussed that over the next 2 weeks may need to make adjustments.  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

## 2019-08-01 NOTE — Progress Notes (Signed)
Procedure Note   Patient was fitted for a : standard, cushioned, semi-rigid orthotic. The orthotic was heated and afterward the patient patient seated position and molded The patient was positioned in subtalar neutral position and 10 degrees of ankle dorsiflexion in a weight bearing stance. After completion of molding, patient did have orthotic management The blank was ground to a stable position for weight bearing. Size: 7 Base: Carbon fiber Additional Posting and Padding: left and right Medial 300/110 270/90 lateral 250/70 transverse 200/40 The patient ambulated these, and they were very comfortable.

## 2019-08-06 MED FILL — TRETINOIN 0.025% CREAM: 0.025 | 20 days supply | Qty: 45 | Fill #0

## 2019-08-07 ENCOUNTER — Encounter: Payer: Self-pay | Admitting: Family Medicine

## 2019-08-13 ENCOUNTER — Encounter: Payer: Self-pay | Admitting: Family Medicine

## 2019-08-14 ENCOUNTER — Other Ambulatory Visit: Payer: Self-pay | Admitting: Family Medicine

## 2019-08-14 MED ORDER — VENLAFAXINE HCL ER 37.5 MG PO CP24: 37.5000 mg | ORAL_CAPSULE | Freq: Every day | ORAL | 0 refills | Status: DC

## 2019-08-14 MED FILL — VENLAFAXINE HCL ER 37.5 MG: 37.5 | 30 days supply | Qty: 30 | Fill #0

## 2019-08-15 ENCOUNTER — Encounter: Payer: Self-pay | Admitting: Family Medicine

## 2019-08-15 ENCOUNTER — Other Ambulatory Visit: Payer: Self-pay

## 2019-08-15 ENCOUNTER — Ambulatory Visit (INDEPENDENT_AMBULATORY_CARE_PROVIDER_SITE_OTHER): Payer: No Typology Code available for payment source | Admitting: Family Medicine

## 2019-08-15 ENCOUNTER — Ambulatory Visit: Payer: No Typology Code available for payment source | Admitting: Family Medicine

## 2019-08-15 VITALS — BP 110/80 | HR 89 | Temp 98.2°F | Ht 67.0 in | Wt 135.2 lb

## 2019-08-15 VITALS — BP 100/60 | HR 93 | Ht 67.0 in | Wt 134.0 lb

## 2019-08-15 DIAGNOSIS — Z0000 Encounter for general adult medical examination without abnormal findings: Secondary | ICD-10-CM

## 2019-08-15 DIAGNOSIS — R768 Other specified abnormal immunological findings in serum: Secondary | ICD-10-CM | POA: Diagnosis not present

## 2019-08-15 DIAGNOSIS — M999 Biomechanical lesion, unspecified: Secondary | ICD-10-CM

## 2019-08-15 DIAGNOSIS — R1012 Left upper quadrant pain: Secondary | ICD-10-CM | POA: Diagnosis not present

## 2019-08-15 DIAGNOSIS — R7689 Other specified abnormal immunological findings in serum: Secondary | ICD-10-CM

## 2019-08-15 DIAGNOSIS — M2550 Pain in unspecified joint: Secondary | ICD-10-CM | POA: Diagnosis not present

## 2019-08-15 DIAGNOSIS — Z13220 Encounter for screening for lipoid disorders: Secondary | ICD-10-CM

## 2019-08-15 DIAGNOSIS — Z0001 Encounter for general adult medical examination with abnormal findings: Secondary | ICD-10-CM

## 2019-08-15 DIAGNOSIS — M357 Hypermobility syndrome: Secondary | ICD-10-CM

## 2019-08-15 DIAGNOSIS — G894 Chronic pain syndrome: Secondary | ICD-10-CM

## 2019-08-15 LAB — COMPREHENSIVE METABOLIC PANEL
ALT: 13 U/L (ref 0–35)
AST: 15 U/L (ref 0–37)
Albumin: 4.8 g/dL (ref 3.5–5.2)
Alkaline Phosphatase: 41 U/L (ref 39–117)
BUN: 9 mg/dL (ref 6–23)
CO2: 27 mEq/L (ref 19–32)
Calcium: 9.8 mg/dL (ref 8.4–10.5)
Chloride: 104 mEq/L (ref 96–112)
Creatinine, Ser: 0.72 mg/dL (ref 0.40–1.20)
GFR: 90.81 mL/min (ref >60.00)
Glucose, Bld: 90 mg/dL (ref 70–99)
Potassium: 4.3 mEq/L (ref 3.5–5.1)
Sodium: 140 mEq/L (ref 135–145)
Total Bilirubin: 0.8 mg/dL (ref 0.2–1.2)
Total Protein: 7.3 g/dL (ref 6.0–8.3)

## 2019-08-15 LAB — CBC WITH DIFFERENTIAL/PLATELET
Basophils Absolute: 0.1 10*3/uL (ref 0.0–0.1)
Basophils Relative: 0.9 % (ref 0.0–3.0)
Eosinophils Absolute: 0.1 10*3/uL (ref 0.0–0.7)
Eosinophils Relative: 1.7 % (ref 0.0–5.0)
HCT: 41.5 % (ref 36.0–46.0)
Hemoglobin: 13.9 g/dL (ref 12.0–15.0)
Lymphocytes Relative: 35.5 % (ref 12.0–46.0)
Lymphs Abs: 2 10*3/uL (ref 0.7–4.0)
MCHC: 33.6 g/dL (ref 30.0–36.0)
MCV: 88.6 fl (ref 78.0–100.0)
Monocytes Absolute: 0.3 10*3/uL (ref 0.1–1.0)
Monocytes Relative: 5.4 % (ref 3.0–12.0)
Neutro Abs: 3.1 10*3/uL (ref 1.4–7.7)
Neutrophils Relative %: 56.5 % (ref 43.0–77.0)
Platelets: 307 10*3/uL (ref 150.0–400.0)
RBC: 4.68 Mil/uL (ref 3.87–5.11)
RDW: 13.1 % (ref 11.5–15.5)
WBC: 5.5 10*3/uL (ref 4.0–10.5)

## 2019-08-15 LAB — LIPID PANEL
Cholesterol: 241 mg/dL — ABNORMAL HIGH (ref 0–200)
HDL: 72.8 mg/dL (ref >39.00)
LDL Cholesterol: 155 mg/dL — ABNORMAL HIGH (ref 0–99)
NonHDL: 168.03
Total CHOL/HDL Ratio: 3
Triglycerides: 66 mg/dL (ref 0.0–149.0)
VLDL: 13.2 mg/dL (ref 0.0–40.0)

## 2019-08-15 LAB — TSH: TSH: 2.95 u[IU]/mL (ref 0.35–4.50)

## 2019-08-15 LAB — SEDIMENTATION RATE: Sed Rate: 8 mm/hr (ref 0–20)

## 2019-08-15 LAB — C-REACTIVE PROTEIN: CRP: <1 mg/dL (ref 0.5–20.0)

## 2019-08-15 NOTE — Progress Notes (Signed)
Angela [name] - 38 y.o. female MRN [id number]  Date of birth: 1981-05-18  Subjective Chief Complaint  Patient presents with  . Annual Exam    Cpe pt has been fasting. Pt has an obgyn & the flu shot    HPI Angela [name] is a 38 y.o. female here today for annual exam.  She has a few additional concerns she would like to discuss today as well.  She continues to see Dr. Tamala Julian of OMT manipulation and had orthotics made which have been helpful.  She does report continue joint pain, worse in her wrists and hands.  Her zoloft and lyrica were stopped and she was changed to effexor recently, this has helped some with fatigue.   Dr. Tamala Julian has also noticed a bluish hue to fingers and toes at times that appear to be consistent with Raynauds.  She has also had fullness in her jaw area with dry mouth and some LUQ pain with mucus in her stool and blood when wiping, although she thinks the blood may be from a hemorrhoid.  She denies abdominal cramping, nausea or weight change.  She has had EGD in the past.  She does try to follow a healthy diet and stays as active as possible but is limited due to joint pain.  She is a non-smoker and consumes EtOH occasionally.    Review of Systems  Constitutional: Negative for chills, fever, malaise/fatigue and weight loss.  HENT: Negative for congestion, ear pain and sore throat.   Eyes: Negative for blurred vision, double vision and pain.  Respiratory: Negative for cough and shortness of breath.   Cardiovascular: Negative for chest pain and palpitations.  Gastrointestinal: Positive for abdominal pain. Negative for blood in stool, constipation, heartburn and nausea.  Genitourinary: Negative for dysuria and urgency.  Musculoskeletal: Positive for joint pain. Negative for myalgias.  Neurological: Negative for dizziness and headaches.  Endo/Heme/Allergies: Does not bruise/bleed easily.  Psychiatric/Behavioral: Negative for depression. The patient is not nervous/anxious  and does not have insomnia.       No Known Allergies  Past Medical History:  Diagnosis Date  . Abnormal Pap smear    repeat ok  . Fibromyalgia 09/02/2017  . Herpes zoster   . Panic attacks   . PCOS (polycystic ovarian syndrome)    conceived on metformin and clomid  . PP SVD 12/02/11 M 12/03/2011    Past Surgical History:  Procedure Laterality Date  . BREAST BIOPSY Right    Benign  . WISDOM TOOTH EXTRACTION      Social History   Socioeconomic History  . Marital status: Married    Spouse name: Not on file  . Number of children: 1  . Years of education: 73  . Highest education level: Not on file  Occupational History  . Occupation: Facilities manager  Social Needs  . Financial resource strain: Not hard at all  . Food insecurity    Worry: Never true    Inability: Never true  . Transportation needs    Medical: No    Non-medical: No  Tobacco Use  . Smoking status: Former Smoker    Quit date: 11/01/2009    Years since quitting: 9.7  . Smokeless tobacco: Never Used  Substance and Sexual Activity  . Alcohol use: Yes    Alcohol/week: 2.0 standard drinks    Types: 2 Cans of beer per week  . Drug use: No  . Sexual activity: Yes    Partners: Male    Birth  control/protection: I.U.D.  Lifestyle  . Physical activity    Days per week: Not on file    Minutes per session: Not on file  . Stress: Only a little  Relationships  . Social Herbalist on phone: Three times a week    Gets together: Three times a week    Attends religious service: Not on file    Active member of club or organization: Not on file    Attends meetings of clubs or organizations: Not on file    Relationship status: Married  Other Topics Concern  . 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    Denies any religious beliefs effecting health care.    Lives with husband and son in a 2 story home.     Education: college.     Family  History  Problem Relation Age of Onset  . Diabetes Father        uncontrolled  . Hypertension Father   . Hyperlipidemia Father   . Alcohol abuse Other   . Arthritis Other   . Ovarian cancer Paternal Grandmother   . Pancreatic cancer Cousin        paternal 2nd cousin  . Anesthesia problems Neg Hx     Health Maintenance  Topic Date Due  . PAP SMEAR-Modifier  07/03/2019  . TETANUS/TDAP  08/18/2026  . INFLUENZA VACCINE  Completed  . HIV Screening  Completed    ----------------------------------------------------------------------------------------------------------------------------------------------------------------------------------------------------------------- Physical Exam BP 110/80   Pulse 89   Temp 98.2 F (36.8 C) (Oral)   Ht _0  (1.702 m)   Wt 135 lb 3.2 oz (61.3 kg)   SpO2 98%   BMI 21.18 kg/m   Physical Exam Constitutional:      General: She is not in acute distress. HENT:     Head: Normocephalic and atraumatic.     Nose: Nose normal.  Eyes:     General: No scleral icterus.    Conjunctiva/sclera: Conjunctivae normal.  Neck:     Musculoskeletal: Normal range of motion and neck supple.     Thyroid: No thyromegaly.  Cardiovascular:     Rate and Rhythm: Normal rate and regular rhythm.     Heart sounds: Normal heart sounds.  Pulmonary:     Effort: Pulmonary effort is normal.     Breath sounds: Normal breath sounds.  Abdominal:     General: Bowel sounds are normal. There is no distension.     Palpations: Abdomen is soft.     Tenderness: There is no abdominal tenderness. There is no guarding.  Musculoskeletal: Normal range of motion.  Lymphadenopathy:     Cervical: No cervical adenopathy.  Skin:    General: Skin is warm and dry.     Findings: No rash.  Neurological:     Mental Status: She is alert and oriented to person, place, and time.     Cranial Nerves: No cranial nerve deficit.     Coordination: Coordination normal.  Psychiatric:         Behavior: Behavior normal.     ------------------------------------------------------------------------------------------------------------------------------------------------------------------------------------------------------------------- Assessment and Plan  Chronic pain syndrome -History of +ANA, will repeat to see if titers have changed given ongoing pain.  -Check ESR, CRP and RF.  Will also check fecal calprotectin given GI symptoms as well.   -Recently changed to effexor, doing ok.  Less fatigued.  -Orthotics have made a difference and she will continue to see  Dr. Tamala Julian for OMT.    Annual visit for general adult medical examination with abnormal findings Well adult.  Chronic pain/Joint/GI issues addressed today.  See separate A&P Orders Placed This Encounter  Procedures  . Calprotectin, Fecal    Standing Status:   Future    Standing Expiration Date:   08/14/2020  . Comp Met (CMET)  . CBC w/Diff  . Lipid panel  . TSH  . ANA,IFA RA Diag Pnl w/rflx Tit/Patn  . Sedimentation rate  . C-reactive protein  Immunizations: UTD Screenings: Lipid Anticipatory guidance/Risk factor reduction:  Per AVS

## 2019-08-15 NOTE — Assessment & Plan Note (Signed)
-  History of +ANA, will repeat to see if titers have changed given ongoing pain.  -Check ESR, CRP and RF.  Will also check fecal calprotectin given GI symptoms as well.   -Recently changed to effexor, doing ok.  Less fatigued.  -Orthotics have made a difference and she will continue to see Dr. Tamala Julian for OMT.

## 2019-08-15 NOTE — Assessment & Plan Note (Signed)
Well adult.  Chronic pain/Joint/GI issues addressed today.  See separate A&P Orders Placed This Encounter  Procedures  . Calprotectin, Fecal    Standing Status:   Future    Standing Expiration Date:   08/14/2020  . Comp Met (CMET)  . CBC w/Diff  . Lipid panel  . TSH  . ANA,IFA RA Diag Pnl w/rflx Tit/Patn  . Sedimentation rate  . C-reactive protein  Immunizations: UTD Screenings: Lipid Anticipatory guidance/Risk factor reduction:  Per AVS

## 2019-08-15 NOTE — Assessment & Plan Note (Signed)
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  Has transition to Effexor and is doing well.  We discussed potentially titrating up to 112 mg and patient would like to take it slow.  Patient is off a couple of her other medications which she is happy with that which includes decreasing and discontinuing the Lyrica.  Muscle relaxer for breakthrough pain.  Follow-up again in 4 to 8 weeks

## 2019-08-15 NOTE — Patient Instructions (Signed)
Preventive Care 21-37 Years Old, Female Preventive care refers to visits with your health care provider and lifestyle choices that can promote health and wellness. This includes:  A yearly physical exam. This may also be called an annual well check.  Regular dental visits and eye exams.  Immunizations.  Screening for certain conditions.  Healthy lifestyle choices, such as eating a healthy diet, getting regular exercise, not using drugs or products that contain nicotine and tobacco, and limiting alcohol use. What can I expect for my preventive care visit? Physical exam Your health care provider will check your:  Height and weight. This may be used to calculate body mass index (BMI), which tells if you are at a healthy weight.  Heart rate and blood pressure.  Skin for abnormal spots. Counseling Your health care provider may ask you questions about your:  Alcohol, tobacco, and drug use.  Emotional well-being.  Home and relationship well-being.  Sexual activity.  Eating habits.  Work and work environment.  Method of birth control.  Menstrual cycle.  Pregnancy history. What immunizations do I need?  Influenza (flu) vaccine  This is recommended every year. Tetanus, diphtheria, and pertussis (Tdap) vaccine  You may need a Td booster every 10 years. Varicella (chickenpox) vaccine  You may need this if you have not been vaccinated. Human papillomavirus (HPV) vaccine  If recommended by your health care provider, you may need three doses over 6 months. Measles, mumps, and rubella (MMR) vaccine  You may need at least one dose of MMR. You may also need a second dose. Meningococcal conjugate (MenACWY) vaccine  One dose is recommended if you are age 19-21 years and a first-year college student living in a residence hall, or if you have one of several medical conditions. You may also need additional booster doses. Pneumococcal conjugate (PCV13) vaccine  You may need  this if you have certain conditions and were not previously vaccinated. Pneumococcal polysaccharide (PPSV23) vaccine  You may need one or two doses if you smoke cigarettes or if you have certain conditions. Hepatitis A vaccine  You may need this if you have certain conditions or if you travel or work in places where you may be exposed to hepatitis A. Hepatitis B vaccine  You may need this if you have certain conditions or if you travel or work in places where you may be exposed to hepatitis B. Haemophilus influenzae type b (Hib) vaccine  You may need this if you have certain conditions. You may receive vaccines as individual doses or as more than one vaccine together in one shot (combination vaccines). Talk with your health care provider about the risks and benefits of combination vaccines. What tests do I need?  Blood tests  Lipid and cholesterol levels. These may be checked every 5 years starting at age 20.  Hepatitis C test.  Hepatitis B test. Screening  Diabetes screening. This is done by checking your blood sugar (glucose) after you have not eaten for a while (fasting).  Sexually transmitted disease (STD) testing.  BRCA-related cancer screening. This may be done if you have a family history of breast, ovarian, tubal, or peritoneal cancers.  Pelvic exam and Pap test. This may be done every 3 years starting at age 21. Starting at age 30, this may be done every 5 years if you have a Pap test in combination with an HPV test. Talk with your health care provider about your test results, treatment options, and if necessary, the need for more tests.   Follow these instructions at home: Eating and drinking   Eat a diet that includes fresh fruits and vegetables, whole grains, lean protein, and low-fat dairy.  Take vitamin and mineral supplements as recommended by your health care provider.  Do not drink alcohol if: ? Your health care provider tells you not to drink. ? You are  pregnant, may be pregnant, or are planning to become pregnant.  If you drink alcohol: ? Limit how much you have to 0-1 drink a day. ? Be aware of how much alcohol is in your drink. In the [place] one drink equals one 12 oz bottle of beer (355 mL), one 5 oz glass of wine (148 mL), or one 1 oz glass of hard liquor (44 mL). Lifestyle  Take daily care of your teeth and gums.  Stay active. Exercise for at least 30 minutes on 5 or more days each week.  Do not use any products that contain nicotine or tobacco, such as cigarettes, e-cigarettes, and chewing tobacco. If you need help quitting, ask your health care provider.  If you are sexually active, practice safe sex. Use a condom or other form of birth control (contraception) in order to prevent pregnancy and STIs (sexually transmitted infections). If you plan to become pregnant, see your health care provider for a preconception visit. What's next?  Visit your health care provider once a year for a well check visit.  Ask your health care provider how often you should have your eyes and teeth checked.  Stay up to date on all vaccines. This information is not intended to replace advice given to you by your health care provider. Make sure you discuss any questions you have with your health care provider. Document Released: 12/14/2001 Document Revised: 06/29/2018 Document Reviewed: 06/29/2018 Elsevier Patient Education  2020 [employer]  

## 2019-08-15 NOTE — Patient Instructions (Signed)
Continue Effexor Great job proud of you!  See me again in 7 weeks

## 2019-08-15 NOTE — Progress Notes (Signed)
[name] [place],  [address] Phone: [phone] Subjective:   I Angela [name] am serving as a Education administrator for Dr. Hulan Saas.   CC: Antalgic gait  BTD:[id number]   9/30/2020Orthotics made today.  Tolerated well.  Discussed that over the next 2 weeks may need to make adjustments.  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  08/15/2019 Angela [name] is a 38 y.o. female coming in with complaint of antalgic gait. States that she loves the orthotics. She can feel a difference in her muscles.  Patient states that she is making significant progress.  Not having as much tightness as she has had in quite some time.  Happy with the result so far.     Past Medical History:  Diagnosis Date  . Abnormal Pap smear    repeat ok  . Fibromyalgia 09/02/2017  . Herpes zoster   . Panic attacks   . PCOS (polycystic ovarian syndrome)    conceived on metformin and clomid  . PP SVD 12/02/11 M 12/03/2011   Past Surgical History:  Procedure Laterality Date  . BREAST BIOPSY Right    Benign  . WISDOM TOOTH EXTRACTION     Social History   Socioeconomic History  . Marital status: Married    Spouse name: Not on file  . Number of children: 1  . Years of education: 82  . Highest education level: Not on file  Occupational History  . Occupation: Facilities manager  Social Needs  . Financial resource strain: Not hard at all  . Food insecurity    Worry: Never true    Inability: Never true  . Transportation needs    Medical: No    Non-medical: No  Tobacco Use  . Smoking status: Former Smoker    Quit date: 11/01/2009    Years since quitting: 9.7  . Smokeless tobacco: Never Used  Substance and Sexual Activity  . Alcohol use: Yes    Alcohol/week: 2.0 standard drinks    Types: 2 Cans of beer per week  . Drug use: No  . Sexual activity: Yes    Partners: Male    Birth control/protection: I.U.D.  Lifestyle  . Physical  activity    Days per week: Not on file    Minutes per session: Not on file  . Stress: Only a little  Relationships  . Social Herbalist on phone: Three times a week    Gets together: Three times a week    Attends religious service: Not on file    Active member of club or organization: Not on file    Attends meetings of clubs or organizations: Not on file    Relationship status: Married  Other Topics Concern  . 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    Denies any religious beliefs effecting health care.    Lives with husband and son in a 2 story home.     Education: college.    No Known Allergies Family History  Problem Relation Age of Onset  . Diabetes Father        uncontrolled  . Hypertension Father   . Hyperlipidemia Father   . Alcohol abuse Other   . Arthritis Other   . Ovarian cancer Paternal Grandmother   . Pancreatic cancer Cousin        paternal 2nd cousin  .  Anesthesia problems Neg Hx        Current Outpatient Medications (Analgesics):  Marland Kitchen  Ibuprofen-Famotidine 800-26.6 MG TABS, Take 1 tablet by mouth 3 (three) times daily as needed.   Current Outpatient Medications (Other):  Marland Kitchen  ALPRAZolam (XANAX) 0.5 MG tablet, Take 1 tablet (0.5 mg total) by mouth daily as needed for anxiety or sleep. .  Clindamycin-Benzoyl Per, Refr, gel,  .  cyclobenzaprine (FLEXERIL) 10 MG tablet, Take 0.5-1 tablets (5-10 mg total) by mouth 3 (three) times daily as needed for muscle spasms. .  Misc Natural Products (CALCIUM PYRUVATE PO),  .  Multiple Vitamin (MULTIVITAMIN WITH MINERALS) TABS tablet, Take 1 tablet by mouth daily. .  Probiotic Product (PROBIOTIC ADVANCED PO), Take by mouth daily.  Marland Kitchen  tretinoin (RETIN-A) 0.025 % cream,  .  valACYclovir (VALTREX) 500 MG tablet, Take 500 mg by mouth as needed for lip care. .  venlafaxine XR (EFFEXOR XR) 37.5 MG 24 hr capsule, Take 1 capsule (37.5 mg total) by mouth daily  with breakfast.    Past medical history, social, surgical and family history all reviewed in electronic medical record.  No pertanent information unless stated regarding to the chief complaint.   Review of Systems:  No headache, visual changes, nausea, vomiting, diarrhea, constipation, dizziness, abdominal pain, skin rash, fevers, chills, night sweats, weight loss, swollen lymph nodes, body aches, joint swelling,  chest pain, shortness of breath, mood changes.  Positive muscle aches  Objective  Blood pressure 100/60, pulse 93, height 5\' 7"  (1.702 m), weight 134 lb (60.8 kg), SpO2 98 %.    General: No apparent distress alert and oriented x3 mood and affect normal, dressed appropriately.  HEENT: Pupils equal, extraocular movements intact  Respiratory: Patient's speak in full sentences and does not appear short of breath  Cardiovascular: No lower extremity edema, non tender, no erythema  Skin: Warm dry intact with no signs of infection or rash on extremities or on axial skeleton.  Abdomen: Soft nontender  Neuro: Cranial nerves II through XII are intact, neurovascularly intact in all extremities with 2+ DTRs and 2+ pulses.  Lymph: No lymphadenopathy of posterior or anterior cervical chain or axillae bilaterally.  Gait normal with good balance and coordination.  MSK:  tender with full range of motion and good stability and symmetric strength and tone of shoulders, elbows, wrist, hip, knee and ankles bilaterally.  Back exam does have some tightness noted more in the thoracolumbar and lumbosacral areas.  Patient now has improvement in range of motion.  Patient has negative straight leg test.  Mild tightness with test.  Foot exam still shows significant narrowing of the feet.  Osteopathic findings   T6 extended rotated and side bent left L1 flexed rotated and side bent right Sacrum right on right    Impression and Recommendations:     This case required medical decision making of  moderate complexity. The above documentation has been reviewed and is accurate and complete Angela Brownie, DO       Note: This dictation was prepared with Dragon dictation along with smaller phrase technology. Any transcriptional errors that result from this process are unintentional.

## 2019-08-15 NOTE — Assessment & Plan Note (Signed)
Decision today to treat with OMT was based on Physical Exam  After verbal consent patient was treated with HVLA, ME, FPR techniques in  thoracic, lumbar and sacral areas  Patient tolerated the procedure well with improvement in symptoms  Patient given exercises, stretches and lifestyle modifications  See medications in patient instructions if given  Patient will follow up in 4-8 weeks 

## 2019-08-16 ENCOUNTER — Other Ambulatory Visit: Payer: No Typology Code available for payment source

## 2019-08-16 DIAGNOSIS — R1012 Left upper quadrant pain: Secondary | ICD-10-CM

## 2019-08-17 ENCOUNTER — Ambulatory Visit: Payer: No Typology Code available for payment source | Admitting: Family Medicine

## 2019-08-17 LAB — ANA,IFA RA DIAG PNL W/RFLX TIT/PATN
Anti Nuclear Antibody (ANA): POSITIVE — AB
Cyclic Citrullin Peptide Ab: <16 UNITS
Rheumatoid fact SerPl-aCnc: <14 IU/mL (ref <14)

## 2019-08-17 LAB — ANTI-NUCLEAR AB-TITER (ANA TITER): ANA Titer 1: 1:320 {titer} — ABNORMAL HIGH

## 2019-08-21 ENCOUNTER — Ambulatory Visit: Payer: No Typology Code available for payment source | Admitting: Family Medicine

## 2019-08-22 ENCOUNTER — Encounter: Payer: Self-pay | Admitting: Family Medicine

## 2019-08-23 ENCOUNTER — Other Ambulatory Visit: Payer: Self-pay | Admitting: Family Medicine

## 2019-08-23 DIAGNOSIS — R768 Other specified abnormal immunological findings in serum: Secondary | ICD-10-CM

## 2019-08-23 DIAGNOSIS — M2550 Pain in unspecified joint: Secondary | ICD-10-CM

## 2019-08-23 DIAGNOSIS — I7300 Raynaud's syndrome without gangrene: Secondary | ICD-10-CM

## 2019-08-25 LAB — CALPROTECTIN, FECAL: Calprotectin, Fecal: 116 ug/g (ref 0–120)

## 2019-08-27 ENCOUNTER — Encounter: Payer: Self-pay | Admitting: Family Medicine

## 2019-08-27 ENCOUNTER — Other Ambulatory Visit: Payer: Self-pay | Admitting: Family Medicine

## 2019-08-27 ENCOUNTER — Other Ambulatory Visit: Payer: Self-pay | Admitting: Internal Medicine

## 2019-08-27 DIAGNOSIS — R1012 Left upper quadrant pain: Secondary | ICD-10-CM

## 2019-08-27 DIAGNOSIS — R768 Other specified abnormal immunological findings in serum: Secondary | ICD-10-CM

## 2019-08-28 ENCOUNTER — Other Ambulatory Visit: Payer: Self-pay

## 2019-08-28 MED ORDER — VENLAFAXINE HCL ER 37.5 MG PO CP24: 37.5000 mg | ORAL_CAPSULE | Freq: Every day | ORAL | 3 refills | Status: DC

## 2019-08-31 ENCOUNTER — Encounter: Payer: Self-pay | Admitting: Family Medicine

## 2019-08-31 MED FILL — VENLAFAXINE HCL ER 37.5 MG: 37.5 | 12 days supply | Qty: 12 | Fill #0

## 2019-09-02 ENCOUNTER — Other Ambulatory Visit: Payer: Self-pay | Admitting: *Deleted

## 2019-09-03 MED ORDER — VENLAFAXINE HCL ER 37.5 MG PO CP24: 112.5000 mg | ORAL_CAPSULE | Freq: Every day | ORAL | 3 refills | Status: DC

## 2019-09-03 NOTE — Telephone Encounter (Signed)
Patient called and left a message regarding the medication below. She states that her prescription was increased to 3 times a day. Due to the increase, she is out of the current prescription and the pharmacy will not fill it early. She would like to know if it can be sent in with the new dose of 3 times a day. She has been without it since Thursday.

## 2019-09-05 MED FILL — VENLAFAXINE HCL ER 37.5 MG: 37.5 | 90 days supply | Qty: 270 | Fill #0

## 2019-09-10 MED FILL — predniSONE 5 MG TABS: 5 | 12 days supply | Qty: 48 | Fill #0

## 2019-09-11 ENCOUNTER — Encounter: Payer: Self-pay | Admitting: Family Medicine

## 2019-09-13 ENCOUNTER — Other Ambulatory Visit: Payer: No Typology Code available for payment source

## 2019-09-17 ENCOUNTER — Other Ambulatory Visit: Payer: No Typology Code available for payment source

## 2019-09-17 DIAGNOSIS — R1012 Left upper quadrant pain: Secondary | ICD-10-CM

## 2019-09-17 DIAGNOSIS — R768 Other specified abnormal immunological findings in serum: Secondary | ICD-10-CM

## 2019-09-21 LAB — CALPROTECTIN, FECAL: Calprotectin, Fecal: 43 ug/g (ref 0–120)

## 2019-09-24 ENCOUNTER — Encounter: Payer: Self-pay | Admitting: Family Medicine

## 2019-09-26 ENCOUNTER — Encounter: Payer: Self-pay | Admitting: Family Medicine

## 2019-10-03 ENCOUNTER — Other Ambulatory Visit: Payer: Self-pay

## 2019-10-03 ENCOUNTER — Ambulatory Visit: Payer: No Typology Code available for payment source | Admitting: Family Medicine

## 2019-10-03 ENCOUNTER — Ambulatory Visit: Payer: Self-pay

## 2019-10-03 ENCOUNTER — Encounter: Payer: Self-pay | Admitting: Family Medicine

## 2019-10-03 ENCOUNTER — Other Ambulatory Visit: Payer: Self-pay | Admitting: Family Medicine

## 2019-10-03 VITALS — BP 110/84 | HR 108 | Ht 67.0 in | Wt 134.0 lb

## 2019-10-03 DIAGNOSIS — M25551 Pain in right hip: Secondary | ICD-10-CM

## 2019-10-03 DIAGNOSIS — M999 Biomechanical lesion, unspecified: Secondary | ICD-10-CM

## 2019-10-03 DIAGNOSIS — I7300 Raynaud's syndrome without gangrene: Secondary | ICD-10-CM

## 2019-10-03 DIAGNOSIS — G5701 Lesion of sciatic nerve, right lower limb: Secondary | ICD-10-CM | POA: Diagnosis not present

## 2019-10-03 DIAGNOSIS — M2550 Pain in unspecified joint: Secondary | ICD-10-CM

## 2019-10-03 DIAGNOSIS — R768 Other specified abnormal immunological findings in serum: Secondary | ICD-10-CM

## 2019-10-03 NOTE — Patient Instructions (Addendum)
Good to see you See me again in 5-6 weeks     [address] 1st floor [place], [place] [address] Phone [phone]

## 2019-10-03 NOTE — Progress Notes (Signed)
[name] [address] [place], [place] [address] Phone: [phone] Subjective:   I Angela [name] am serving as a Neurosurgeon for Dr. Antoine Primas.  I'm seeing this patient by the request  of:    This visit occurred during the SARS-CoV-2 public health emergency.  Safety protocols were in place, including screening questions prior to the visit, additional usage of staff PPE, and extensive cleaning of exam room while observing appropriate contact time as indicated for disinfecting solutions.    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  Has transition to Effexor and is doing well.  We discussed potentially titrating up to 112 mg and patient would like to take it slow.  Patient is off a couple of her other medications which she is happy with that which includes decreasing and discontinuing the Lyrica.  Muscle relaxer for breakthrough pain.  Follow-up again in 4 to 8 weeks  Update 10/03/2019 Angela [name] is a 38 y.o. female coming in with complaint of back pain. Patient is here for OMT to treat her SI joint pain. Patient states she is still in pain. Would like another injection. Right side is most pain.  Patient has had injection in previously and did respond.  Patient feels like she needs another injection.  Has responded well to manipulation as well.     Past Medical History:  Diagnosis Date  . Abnormal Pap smear    repeat ok  . Fibromyalgia 09/02/2017  . Herpes zoster   . Panic attacks   . PCOS (polycystic ovarian syndrome)    conceived on metformin and clomid  . PP SVD 12/02/11 M 12/03/2011   Past Surgical History:  Procedure Laterality Date  . BREAST BIOPSY Right    Benign  . WISDOM TOOTH EXTRACTION     Social History   Socioeconomic History  . Marital status: Married    Spouse name: Not on file  . Number of children: 1  . Years of education: 1  . Highest  education level: Not on file  Occupational History  . Occupation: Systems developer  Social Needs  . Financial resource strain: Not hard at all  . Food insecurity    Worry: Never true    Inability: Never true  . Transportation needs    Medical: No    Non-medical: No  Tobacco Use  . Smoking status: Former Smoker    Quit date: 11/01/2009    Years since quitting: 9.9  . Smokeless tobacco: Never Used  Substance and Sexual Activity  . Alcohol use: Yes    Alcohol/week: 2.0 standard drinks    Types: 2 Cans of beer per week  . Drug use: No  . Sexual activity: Yes    Partners: Male    Birth control/protection: I.U.D.  Lifestyle  . Physical activity    Days per week: Not on file    Minutes per session: Not on file  . Stress: Only a little  Relationships  . Social Musician on phone: Three times a week    Gets together: Three times a week    Attends religious service: Not on file    Active member of club or organization: Not on file    Attends meetings of clubs or organizations: Not on file    Relationship status: Married  Other Topics Concern  . 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    Denies any religious beliefs effecting health care.    Lives with husband and son in a 2 story home.     Education: college.    No Known Allergies Family History  Problem Relation Age of Onset  . Diabetes Father        uncontrolled  . Hypertension Father   . Hyperlipidemia Father   . Alcohol abuse Other   . Arthritis Other   . Ovarian cancer Paternal Grandmother   . Pancreatic cancer Cousin        paternal 2nd cousin  . Anesthesia problems Neg Hx        Current Outpatient Medications (Analgesics):  Marland Kitchen  Ibuprofen-Famotidine 800-26.6 MG TABS, Take 1 tablet by mouth 3 (three) times daily as needed.   Current Outpatient Medications (Other):  Marland Kitchen  ALPRAZolam (XANAX) 0.5 MG tablet, Take 1 tablet (0.5 mg total) by  mouth daily as needed for anxiety or sleep. .  Clindamycin-Benzoyl Per, Refr, gel,  .  Misc Natural Products (CALCIUM PYRUVATE PO),  .  Multiple Vitamin (MULTIVITAMIN WITH MINERALS) TABS tablet, Take 1 tablet by mouth daily. .  Probiotic Product (PROBIOTIC ADVANCED PO), Take by mouth daily.  Marland Kitchen  tretinoin (RETIN-A) 0.025 % cream,  .  valACYclovir (VALTREX) 500 MG tablet, Take 500 mg by mouth as needed for lip care. .  venlafaxine XR (EFFEXOR XR) 37.5 MG 24 hr capsule, Take 3 capsules (112.5 mg total) by mouth daily with breakfast.    Past medical history, social, surgical and family history all reviewed in electronic medical record.  No pertanent information unless stated regarding to the chief complaint.   Review of Systems:  No headache, visual changes, nausea, vomiting, diarrhea, constipation, dizziness, abdominal pain, skin rash, fevers, chills, night sweats, weight loss, swollen lymph nodes, body aches, joint swelling,  chest pain, shortness of breath, mood changes.  Positive muscle aches  Objective  Blood pressure 110/84, pulse (!) 108, height 5\' 7"  (1.702 m), weight 134 lb (60.8 kg), SpO2 98 %.    General: No apparent distress alert and oriented x3 mood and affect normal, dressed appropriately.  HEENT: Pupils equal, extraocular movements intact  Respiratory: Patient's speak in full sentences and does not appear short of breath  Cardiovascular: No lower extremity edema, non tender, no erythema  Skin: Warm dry intact with no signs of infection or rash on extremities or on axial skeleton.  Abdomen: Soft nontender  Neuro: Cranial nerves II through XII are intact, neurovascularly intact in all extremities with 2+ DTRs and 2+ pulses.  Lymph: No lymphadenopathy of posterior or anterior cervical chain or axillae bilaterally.  Gait normal with good balance and coordination.  MSK:  Non tender with full range of motion and good stability and symmetric strength and tone of shoulders, elbows,  wrist, hip, knee and ankles bilaterally.  Back exam mild loss of lordosis.  More tenderness over the piriformis.  Positive Faber bilaterally.  Negative straight leg test.  Osteopathic findings   T9 extended rotated and side bent left L2 flexed rotated and side bent right Sacrum right on right   Procedure: Real-time Ultrasound Guided Injection of piriformis tendon sheath Device: GE Logiq Q7 Ultrasound guided injection is preferred based studies that show increased duration, increased effect, greater accuracy, decreased procedural pain, increased response rate, and decreased cost with ultrasound guided versus blind injection.  Verbal informed consent obtained.  Time-out conducted.  Noted no overlying erythema, induration,  or other signs of local infection.  Skin prepped in a sterile fashion.  Local anesthesia: Topical Ethyl chloride.  With sterile technique and under real time ultrasound guidance: With a 21-gauge 2 inch needle injected with 0.5 cc of 0.5% Marcaine and 0.5 cc of Kenalog 40 mg/mL into the tendon sheath. Completed without difficulty  Pain immediately resolved suggesting accurate placement of the medication.  Advised to call if fevers/chills, erythema, induration, drainage, or persistent bleeding.  Images permanently stored and available for review in the ultrasound unit.  Impression: Technically successful ultrasound guided injection.   Impression and Recommendations:     This case required medical decision making of moderate complexity. The above documentation has been reviewed and is accurate and complete Angela SaaZachary M Maven Rosander, DO       Note: This dictation was prepared with Dragon dictation along with smaller phrase technology. Any transcriptional errors that result from this process are unintentional.

## 2019-10-04 ENCOUNTER — Encounter: Payer: Self-pay | Admitting: Family Medicine

## 2019-10-04 NOTE — Assessment & Plan Note (Signed)
Decision today to treat with OMT was based on Physical Exam  After verbal consent patient was treated with HVLA, ME, FPR techniques in  thoracic, lumbar and sacral areas  Patient tolerated the procedure well with improvement in symptoms  Patient given exercises, stretches and lifestyle modifications  See medications in patient instructions if given  Patient will follow up in 4-8 weeks 

## 2019-10-04 NOTE — Assessment & Plan Note (Signed)
Repeat injection given today.  Discussed icing regimen and home exercises, discussed which activities of doing which was to avoid.  Patient should increase activity slowly.  Follow-up again in 4 to 8 weeks

## 2019-10-17 ENCOUNTER — Encounter: Payer: Self-pay | Admitting: Family Medicine

## 2019-11-04 ENCOUNTER — Encounter: Payer: Self-pay | Admitting: Family Medicine

## 2019-11-08 ENCOUNTER — Ambulatory Visit (INDEPENDENT_AMBULATORY_CARE_PROVIDER_SITE_OTHER): Payer: No Typology Code available for payment source | Admitting: Family Medicine

## 2019-11-08 ENCOUNTER — Encounter: Payer: Self-pay | Admitting: Family Medicine

## 2019-11-08 ENCOUNTER — Other Ambulatory Visit: Payer: Self-pay

## 2019-11-08 VITALS — BP 110/72 | HR 105 | Ht 67.0 in | Wt 135.0 lb

## 2019-11-08 DIAGNOSIS — G894 Chronic pain syndrome: Secondary | ICD-10-CM

## 2019-11-08 DIAGNOSIS — M999 Biomechanical lesion, unspecified: Secondary | ICD-10-CM | POA: Diagnosis not present

## 2019-11-08 DIAGNOSIS — M357 Hypermobility syndrome: Secondary | ICD-10-CM

## 2019-11-08 DIAGNOSIS — M7672 Peroneal tendinitis, left leg: Secondary | ICD-10-CM | POA: Diagnosis not present

## 2019-11-08 NOTE — Assessment & Plan Note (Signed)
Patient recently did see a new rheumatologist but still work-up has been fairly inconclusive.  Patient has done relatively well with the Effexor.  Is on 112 mg.  Unable to tolerate likely higher dosing.  We discussed icing regimen and home exercises, which activities to do which wants to avoid.  Patient is to follow-up with me again 6 weeks

## 2019-11-08 NOTE — Assessment & Plan Note (Signed)
Peroneal tendinitis likely some mild cyst formation.  Discussed with patient about posture and ergonomics, we discussed proper compression, discussed heel lift, follow-up again 4 to 6 weeks

## 2019-11-08 NOTE — Assessment & Plan Note (Signed)
   Decision today to treat with OMT was based on Physical Exam  After verbal consent patient was treated with HVLA, ME, FPR techniques in  thoracic, lumbar and sacral areas  Patient tolerated the procedure well with improvement in symptoms  Patient given exercises, stretches and lifestyle modifications  See medications in patient instructions if given  Patient will follow up in6 weeks 

## 2019-11-08 NOTE — Progress Notes (Signed)
[name] [address] [place] [address] Phone: [phone] Subjective:   Angela [name], am serving as a scribe for Dr. Antoine Primas. This visit occurred during the SARS-CoV-2 public health emergency.  Safety protocols were in place, including screening questions prior to the visit, additional usage of staff PPE, and extensive cleaning of exam room while observing appropriate contact time as indicated for disinfecting solutions.    CC: Low back and hip pain follow-up  PPJ:[id number]   10/03/2019 Repeat injection given today.  Discussed icing regimen and home exercises, discussed which activities of doing which was to avoid.  Patient should increase activity slowly.  Follow-up again in 4 to 8 weeks  Update 11/08/2019 Angela [name] is a 39 y.o. female coming in with complaint of back and hip pain. Patient last seen on 10/03/2019 for injection of hip and OMT. Patient states that she is doing better but is still not 100%. Is having lateral ankle swelling of left ankle. No pain but feels numbness for the past 3 weeks.  Patient states that there is swelling of the left ankle.  Patient states that seems to be worse again in the long day.  When she walks a lot seems to get different more discomfort.  Continues to have, chronic pain overall.  Still having some fatigue as well.  Did see a new rheumatologist with significantly unusual information or findings she states as a televisit with him tomorrow     Past Medical History:  Diagnosis Date  . Abnormal Pap smear    repeat ok  . Fibromyalgia 09/02/2017  . Herpes zoster   . Panic attacks   . PCOS (polycystic ovarian syndrome)    conceived on metformin and clomid  . PP SVD 12/02/11 M 12/03/2011   Past Surgical History:  Procedure Laterality Date  . BREAST BIOPSY Right    Benign  . WISDOM TOOTH EXTRACTION     Social History   Socioeconomic History  . Marital status: Married    Spouse name: Not on  file  . Number of children: 1  . Years of education: 51  . Highest education level: Not on file  Occupational History  . Occupation: Systems developer  Tobacco Use  . Smoking status: Former Smoker    Quit date: 11/01/2009    Years since quitting: 10.0  . Smokeless tobacco: Never Used  Substance and Sexual Activity  . Alcohol use: Yes    Alcohol/week: 2.0 standard drinks    Types: 2 Cans of beer per week  . Drug use: No  . Sexual activity: Yes    Partners: Male    Birth control/protection: I.U.D.  Other Topics Concern  . 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    Denies any religious beliefs effecting health care.    Lives with husband and son in a 2 story home.     Education: college.    Social Determinants of Health   Financial Resource Strain: Low Risk   . Difficulty of Paying Living Expenses: Not hard at all  Food Insecurity: No Food Insecurity  . Worried About Programme researcher, broadcasting/film/video in the Last Year: Never true  . Ran Out of Food in the Last Year: Never true  Transportation Needs: No Transportation Needs  . Lack of Transportation (Medical): No  . Lack of Transportation (Non-Medical): No  Physical Activity:   . Days of Exercise  per Week: Not on file  . Minutes of Exercise per Session: Not on file  Stress: No Stress Concern Present  . Feeling of Stress : Only a little  Social Connections: Unknown  . Frequency of Communication with Friends and Family: Three times a week  . Frequency of Social Gatherings with Friends and Family: Three times a week  . Attends Religious Services: Not on file  . Active Member of Clubs or Organizations: Not on file  . Attends Archivist Meetings: Not on file  . Marital Status: Married   No Known Allergies Family History  Problem Relation Age of Onset  . Diabetes Father        uncontrolled  . Hypertension Father   . Hyperlipidemia Father   . Alcohol abuse Other   .  Arthritis Other   . Ovarian cancer Paternal Grandmother   . Pancreatic cancer Cousin        paternal 2nd cousin  . Anesthesia problems Neg Hx        Current Outpatient Medications (Analgesics):  Marland Kitchen  Ibuprofen-Famotidine 800-26.6 MG TABS, Take 1 tablet by mouth 3 (three) times daily as needed.   Current Outpatient Medications (Other):  Marland Kitchen  ALPRAZolam (XANAX) 0.5 MG tablet, Take 1 tablet (0.5 mg total) by mouth daily as needed for anxiety or sleep. .  Clindamycin-Benzoyl Per, Refr, gel,  .  Misc Natural Products (CALCIUM PYRUVATE PO),  .  Multiple Vitamin (MULTIVITAMIN WITH MINERALS) TABS tablet, Take 1 tablet by mouth daily. .  Probiotic Product (PROBIOTIC ADVANCED PO), Take by mouth daily.  Marland Kitchen  tretinoin (RETIN-A) 0.025 % cream,  .  valACYclovir (VALTREX) 500 MG tablet, Take 500 mg by mouth as needed for lip care. .  venlafaxine XR (EFFEXOR XR) 37.5 MG 24 hr capsule, Take 3 capsules (112.5 mg total) by mouth daily with breakfast.    Past medical history, social, surgical and family history all reviewed in electronic medical record.  No pertanent information unless stated regarding to the chief complaint.   Review of Systems:  No headache, visual changes, nausea, vomiting, diarrhea, constipation, dizziness, abdominal pain, skin rash, fevers, chills, night sweats, weight loss, swollen lymph nodes,chest pain, shortness of breath, mood changes.  Positive muscle aches, joint swelling, body aches  Objective  Blood pressure 110/72, pulse (!) 105, height 5\' 7"  (1.702 m), weight 135 lb (61.2 kg), SpO2 99 %.    General: No apparent distress alert and oriented x3 mood and affect normal, dressed appropriately.  HEENT: Pupils equal, extraocular movements intact  Respiratory: Patient's speak in full sentences and does not appear short of breath  Cardiovascular: No lower extremity edema, non tender, no erythema  Skin: Warm dry intact with no signs of infection or rash on extremities or on axial  skeleton.  Abdomen: Soft nontender  Neuro: Cranial nerves II through XII are intact, neurovascularly intact in all extremities with 2+ DTRs and 2+ pulses.  Lymph: No lymphadenopathy of posterior or anterior cervical chain or axillae bilaterally.  Gait normal with good balance and coordination.  MSK:  tender with full range of motion and good stability and symmetric strength and tone of shoulders, elbows, wrist, hip, knee and bilaterally.   Left ankle does show that there is swelling over the peroneal tendons.  Some mild tightness noted especially with dorsiflexion of the lateral aspect.  Patient does have negative Tinel's sign in the ankle.  Back exam still has some tenderness mostly in the soft tissue.  Does have pain over  the sacroiliac joint as well.  Hypomobility noted abdomen joint.  Pain is out of proportion to the amount of palpation.  Negative straight leg test.  Osteopathic findings  T6 extended rotated and side bent left L2 flexed rotated and side bent right Sacrum left on the left      Impression and Recommendations:     This case required medical decision making of moderate complexity. The above documentation has been reviewed and is accurate and complete Judi Saa, DO       Note: This dictation was prepared with Dragon dictation along with smaller phrase technology. Any transcriptional errors that result from this process are unintentional.

## 2019-11-08 NOTE — Patient Instructions (Signed)
Ankle exercises Heel lift in shoe with walking Compression socks DHEA 50 mg daily for 4 weeks See me in 6-7 weeks

## 2019-11-15 ENCOUNTER — Encounter: Payer: Self-pay | Admitting: Family Medicine

## 2019-12-01 MED FILL — VENLAFAXINE HCL ER 37.5 MG: 37.5 | 90 days supply | Qty: 270 | Fill #1

## 2019-12-11 NOTE — Telephone Encounter (Signed)
Error

## 2019-12-20 ENCOUNTER — Ambulatory Visit: Payer: No Typology Code available for payment source | Admitting: Family Medicine

## 2019-12-24 ENCOUNTER — Telehealth: Payer: Self-pay | Admitting: Family Medicine

## 2019-12-24 NOTE — Telephone Encounter (Signed)
Lvm for pt to call back about message sent in:  Appointment Request From: Angela [name]    With Provider: Everrett Coombe, DO [[facility]]    Preferred Date Range: 02/18/2020 - 02/22/2020    Preferred Times: Monday Morning, Tuesday Morning, Wednesday Morning, Thursday Morning, Friday Morning    Reason for visit: Annual Physical    Comments:  Follow up on high cholesterol from annual physical in October. First appointment of the day would be my preference. Thank you!

## 2019-12-31 NOTE — Telephone Encounter (Signed)
Tried to call again 12/31/19 2:50

## 2020-01-07 ENCOUNTER — Other Ambulatory Visit: Payer: Self-pay

## 2020-01-07 ENCOUNTER — Ambulatory Visit: Payer: No Typology Code available for payment source | Admitting: Family Medicine

## 2020-01-07 ENCOUNTER — Encounter: Payer: Self-pay | Admitting: Family Medicine

## 2020-01-07 VITALS — BP 106/70 | HR 120 | Ht 67.0 in | Wt 138.0 lb

## 2020-01-07 DIAGNOSIS — M357 Hypermobility syndrome: Secondary | ICD-10-CM

## 2020-01-07 DIAGNOSIS — M999 Biomechanical lesion, unspecified: Secondary | ICD-10-CM

## 2020-01-07 NOTE — Assessment & Plan Note (Signed)
Chronic problem, mild exacerbation.  Patient has been doing high intensity working out and I think is contributing to some discomfort and pain.  Patient is supposed to increase activity slowly.  Discussed posture and ergonomics, which activities to avoid.  Social determinants of health including patient still avoiding lesions so is unable to do the exercises on a regular basis.  Discussed home exercises and different apps like to be helpful.  Follow-up again in 6 to 8 weeks

## 2020-01-07 NOTE — Patient Instructions (Signed)
See me again in 6-8 weeks ?

## 2020-01-07 NOTE — Assessment & Plan Note (Signed)
Decision today to treat with OMT was based on Physical Exam  After verbal consent patient was treated with HVLA, ME, FPR techniques in  thoracic, lumbar and sacral areas  Patient tolerated the procedure well with improvement in symptoms  Patient given exercises, stretches and lifestyle modifications  See medications in patient instructions if given  Patient will follow up in 4-8 weeks 

## 2020-01-07 NOTE — Progress Notes (Signed)
[name] [address] [place] [address] Phone: [phone] Subjective:   Angela [name], am serving as a scribe for Dr. Antoine Primas. This visit occurred during the SARS-CoV-2 public health emergency.  Safety protocols were in place, including screening questions prior to the visit, additional usage of staff PPE, and extensive cleaning of exam room while observing appropriate contact time as indicated for disinfecting solutions.   I'm seeing this patient by the request  of:  Angela Coombe, DO  CC: Low back pain follow-up  [id number]  Angela [name] is a 39 y.o. female coming in with complaint of back pain. Last seen on 11/08/2019 for OMT. Patient states that she had to cancel her last appointment. Is ready for adjustment.  Patient has been doing the exercises occasionally.  Does try to workout on a regular basis but finds it difficult.      Past Medical History:  Diagnosis Date  . Abnormal Pap smear    repeat ok  . Fibromyalgia 09/02/2017  . Herpes zoster   . Panic attacks   . PCOS (polycystic ovarian syndrome)    conceived on metformin and clomid  . PP SVD 12/02/11 M 12/03/2011   Past Surgical History:  Procedure Laterality Date  . BREAST BIOPSY Right    Benign  . WISDOM TOOTH EXTRACTION     Social History   Socioeconomic History  . Marital status: Married    Spouse name: Not on file  . Number of children: 1  . Years of education: 34  . Highest education level: Not on file  Occupational History  . Occupation: Systems developer  Tobacco Use  . Smoking status: Former Smoker    Quit date: 11/01/2009    Years since quitting: 10.1  . Smokeless tobacco: Never Used  Substance and Sexual Activity  . Alcohol use: Yes    Alcohol/week: 2.0 standard drinks    Types: 2 Cans of beer per week  . Drug use: No  . Sexual activity: Yes    Partners: Male    Birth control/protection: I.U.D.  Other Topics Concern  . 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    Denies any religious beliefs effecting health care.    Lives with husband and son in a 2 story home.     Education: college.    Social Determinants of Health   Financial Resource Strain: Low Risk   . Difficulty of Paying Living Expenses: Not hard at all  Food Insecurity: No Food Insecurity  . Worried About Programme researcher, broadcasting/film/video in the Last Year: Never true  . Ran Out of Food in the Last Year: Never true  Transportation Needs: No Transportation Needs  . Lack of Transportation (Medical): No  . Lack of Transportation (Non-Medical): No  Physical Activity:   . Days of Exercise per Week: Not on file  . Minutes of Exercise per Session: Not on file  Stress: No Stress Concern Present  . Feeling of Stress : Only a little  Social Connections: Unknown  . Frequency of Communication with Friends and Family: Three times a week  . Frequency of Social Gatherings with Friends and Family: Three times a week  . Attends Religious Services: Not on file  . Active Member of Clubs or Organizations: Not on file  . Attends Banker Meetings: Not on file  . Marital Status: Married   No  Known Allergies Family History  Problem Relation Age of Onset  . Diabetes Father        uncontrolled  . Hypertension Father   . Hyperlipidemia Father   . Alcohol abuse Other   . Arthritis Other   . Ovarian cancer Paternal Grandmother   . Pancreatic cancer Cousin        paternal 2nd cousin  . Anesthesia problems Neg Hx        Current Outpatient Medications (Analgesics):  Marland Kitchen  Ibuprofen-Famotidine 800-26.6 MG TABS, Take 1 tablet by mouth 3 (three) times daily as needed.   Current Outpatient Medications (Other):  Marland Kitchen  ALPRAZolam (XANAX) 0.5 MG tablet, Take 1 tablet (0.5 mg total) by mouth daily as needed for anxiety or sleep. .  Clindamycin-Benzoyl Per, Refr, gel,  .  Multiple Vitamin (MULTIVITAMIN WITH MINERALS)  TABS tablet, Take 1 tablet by mouth daily. .  Probiotic Product (PROBIOTIC ADVANCED PO), Take by mouth daily.  Marland Kitchen  tretinoin (RETIN-A) 0.025 % cream,  .  valACYclovir (VALTREX) 500 MG tablet, Take 500 mg by mouth as needed for lip care. .  venlafaxine XR (EFFEXOR XR) 37.5 MG 24 hr capsule, Take 3 capsules (112.5 mg total) by mouth daily with breakfast. .  Misc Natural Products (CALCIUM PYRUVATE PO),    Reviewed prior external information including notes and imaging from  primary care provider As well as notes that were available from care everywhere and other healthcare systems.  Past medical history, social, surgical and family history all reviewed in electronic medical record.  No pertanent information unless stated regarding to the chief complaint.   Review of Systems:  No headache, visual changes, nausea, vomiting, diarrhea, constipation, dizziness, abdominal pain, skin rash, fevers, chills, night sweats, weight loss, swollen lymph nodes, body aches, joint swelling, chest pain, shortness of breath, mood changes. POSITIVE muscle aches  Objective  Blood pressure 106/70, pulse (!) 120, height 5\' 7"  (1.702 m), weight 138 lb (62.6 kg), SpO2 97 %.   General: No apparent distress alert and oriented x3 mood and affect normal, dressed appropriately.  HEENT: Pupils equal, extraocular movements intact  Respiratory: Patient's speak in full sentences and does not appear short of breath  Cardiovascular: No lower extremity edema, non tender, no erythema  Skin: Warm dry intact with no signs of infection or rash on extremities or on axial skeleton.  Abdomen: Soft nontender  Neuro: Cranial nerves II through XII are intact, neurovascularly intact in all extremities with 2+ DTRs and 2+ pulses.  Lymph: No lymphadenopathy of posterior or anterior cervical chain or axillae bilaterally.  Gait normal with good balance and coordination.  MSK:  Non tender with full range of motion and good stability and  symmetric strength and tone of shoulders, elbows, wrist, hip, knee and ankles bilaterally.  Hypermobility noted Neck: Inspection mild loss of lordosis. No palpable stepoffs. Negative Spurling's maneuver. Full neck range of motion Grip strength and sensation normal in bilateral hands Strength good C4 to T1 distribution No sensory change to C4 to T1 Negative Hoffman sign bilaterally Reflexes normal Tightness noted in the parascapular region right greater than left.    Back Exam:  Inspection: Mild loss of lordosis Motion: Flexion 45 deg, Extension 25 deg, Side Bending to 25 deg bilaterally,  Rotation to 45 deg bilaterally  SLR laying: Negative  XSLR laying: Negative  Palpable tenderness: Tender to palpation of paraspinal musculature lumbar spine. FABER: negative. Sensory change: Gross sensation intact to all lumbar and sacral dermatomes.  Reflexes: 2+ at  both patellar tendons, 2+ at achilles tendons, Babinski's downgoing.  Strength at foot  Plantar-flexion: 5/5 Dorsi-flexion: 5/5 Eversion: 5/5 Inversion: 5/5  Leg strength  Quad: 5/5 Hamstring: 5/5 Hip flexor: 5/5 Hip abductors: 5/5   Osteopathic findings  T7 extended rotated and side bent left L2 flexed rotated and side bent right Sacrum right on right   Impression and Recommendations:     This case required medical decision making of moderate complexity. The above documentation has been reviewed and is accurate and complete Judi Saa, DO       Note: This dictation was prepared with Dragon dictation along with smaller phrase technology. Any transcriptional errors that result from this process are unintentional.

## 2020-02-15 ENCOUNTER — Ambulatory Visit (INDEPENDENT_AMBULATORY_CARE_PROVIDER_SITE_OTHER): Payer: No Typology Code available for payment source | Admitting: Family Medicine

## 2020-02-15 ENCOUNTER — Encounter: Payer: Self-pay | Admitting: Family Medicine

## 2020-02-15 ENCOUNTER — Other Ambulatory Visit: Payer: Self-pay

## 2020-02-15 VITALS — BP 131/73 | HR 118 | Temp 98.2°F | Ht 67.0 in | Wt 132.0 lb

## 2020-02-15 DIAGNOSIS — G894 Chronic pain syndrome: Secondary | ICD-10-CM

## 2020-02-15 DIAGNOSIS — E782 Mixed hyperlipidemia: Secondary | ICD-10-CM

## 2020-02-15 NOTE — Patient Instructions (Signed)
Have labs completed either here or at [place] [facility].   [facility] [address], [place], [place] [address]

## 2020-02-18 ENCOUNTER — Encounter: Payer: Self-pay | Admitting: Family Medicine

## 2020-02-18 DIAGNOSIS — E785 Hyperlipidemia, unspecified: Secondary | ICD-10-CM | POA: Insufficient documentation

## 2020-02-18 NOTE — Assessment & Plan Note (Signed)
Update lipid panel.  

## 2020-02-18 NOTE — Progress Notes (Signed)
Angela [name] - 39 y.o. female MRN [id number]  Date of birth: 22-Jan-1981  Subjective Chief Complaint  Patient presents with  . Wound Check    HPI Angela [name] is a 39 y.o. female with history of elevated cholesterol and chronic joint pain here today for follow up.  She is due to have updated lipid panel.  She has seen another rheumatologist for positive ANA and had further testing showing that she has antiphospholipid syndrome.  She continues to see Dr. Katrinka Blazing for OMT to help with mgmt of her pain.   ROS:  A comprehensive ROS was completed and negative except as noted per HPI  No Known Allergies  Past Medical History:  Diagnosis Date  . Abnormal Pap smear    repeat ok  . Fibromyalgia 09/02/2017  . Herpes zoster   . Panic attacks   . PCOS (polycystic ovarian syndrome)    conceived on metformin and clomid  . PP SVD 12/02/11 M 12/03/2011    Past Surgical History:  Procedure Laterality Date  . BREAST BIOPSY Right    Benign  . WISDOM TOOTH EXTRACTION      Social History   Socioeconomic History  . Marital status: Married    Spouse name: Not on file  . Number of children: 1  . Years of education: 53  . Highest education level: Not on file  Occupational History  . Occupation: Systems developer  Tobacco Use  . Smoking status: Former Smoker    Quit date: 11/01/2009    Years since quitting: 10.3  . Smokeless tobacco: Never Used  Substance and Sexual Activity  . Alcohol use: Yes    Alcohol/week: 2.0 standard drinks    Types: 2 Cans of beer per week  . Drug use: No  . Sexual activity: Yes    Partners: Male    Birth control/protection: I.U.D.  Other Topics Concern  . 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    Denies any religious beliefs effecting health care.    Lives with husband and son in a 2 story home.     Education: college.    Social Determinants of Health   Financial Resource Strain: Low Risk    . Difficulty of Paying Living Expenses: Not hard at all  Food Insecurity: No Food Insecurity  . Worried About Programme researcher, broadcasting/film/video in the Last Year: Never true  . Ran Out of Food in the Last Year: Never true  Transportation Needs: No Transportation Needs  . Lack of Transportation (Medical): No  . Lack of Transportation (Non-Medical): No  Physical Activity:   . Days of Exercise per Week:   . Minutes of Exercise per Session:   Stress: No Stress Concern Present  . Feeling of Stress : Only a little  Social Connections: Unknown  . Frequency of Communication with Friends and Family: Three times a week  . Frequency of Social Gatherings with Friends and Family: Three times a week  . Attends Religious Services: Not on file  . Active Member of Clubs or Organizations: Not on file  . Attends Banker Meetings: Not on file  . Marital Status: Married    Family History  Problem Relation Age of Onset  . Diabetes Father        uncontrolled  . Hypertension Father   . Hyperlipidemia Father   . Alcohol abuse Other   . Arthritis Other   . Ovarian  cancer Paternal Grandmother   . Pancreatic cancer Cousin        paternal 2nd cousin  . Anesthesia problems Neg Hx     Health Maintenance  Topic Date Due  . COVID-19 Vaccine (1) Never done  . INFLUENZA VACCINE  06/01/2020  . PAP SMEAR-Modifier  12/31/2022  . TETANUS/TDAP  08/18/2026  . HIV Screening  Completed     ----------------------------------------------------------------------------------------------------------------------------------------------------------------------------------------------------------------- Physical Exam BP 131/73   Pulse (!) 118   Temp 98.2 F (36.8 C) (Oral)   Ht 5\' 7"  (1.702 m)   Wt 132 lb (59.9 kg)   BMI 20.67 kg/m   Physical Exam Constitutional:      Appearance: Normal appearance.  HENT:     Head: Normocephalic and atraumatic.  Skin:    General: Skin is warm and dry.  Neurological:      General: No focal deficit present.     Mental Status: She is alert.  Psychiatric:        Mood and Affect: Mood normal.        Behavior: Behavior normal.     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  Followed by sports medicine and stable at this time.  Positive ANA with dx of antiphospholipid.    HLD (hyperlipidemia) Update lipid panel   No orders of the defined types were placed in this encounter.   Return in about 6 months (around 08/16/2020) for Annual exam.    This visit occurred during the SARS-CoV-2 public health emergency.  Safety protocols were in place, including screening questions prior to the visit, additional usage of staff PPE, and extensive cleaning of exam room while observing appropriate contact time as indicated for disinfecting solutions.

## 2020-02-18 NOTE — Assessment & Plan Note (Signed)
Due to hypermobility.  Followed by sports medicine and stable at this time.  Positive ANA with dx of antiphospholipid.

## 2020-02-21 MED FILL — VENLAFAXINE HCL ER 37.5 MG: 37.5 | 90 days supply | Qty: 270 | Fill #2

## 2020-02-28 ENCOUNTER — Ambulatory Visit: Payer: No Typology Code available for payment source | Admitting: Family Medicine

## 2020-02-28 ENCOUNTER — Other Ambulatory Visit: Payer: Self-pay

## 2020-02-28 VITALS — BP 112/78 | Ht 67.0 in | Wt 133.0 lb

## 2020-02-28 DIAGNOSIS — M357 Hypermobility syndrome: Secondary | ICD-10-CM | POA: Diagnosis not present

## 2020-02-28 DIAGNOSIS — M999 Biomechanical lesion, unspecified: Secondary | ICD-10-CM

## 2020-02-28 DIAGNOSIS — M7672 Peroneal tendinitis, left leg: Secondary | ICD-10-CM

## 2020-02-28 LAB — COMPLETE METABOLIC PANEL WITH GFR
AG Ratio: 2.3 (calc) (ref 1.0–2.5)
ALT: 12 U/L (ref 6–29)
AST: 15 U/L (ref 10–30)
Albumin: 4.6 g/dL (ref 3.6–5.1)
Alkaline phosphatase (APISO): 37 U/L (ref 31–125)
BUN: 14 mg/dL (ref 7–25)
CO2: 23 mmol/L (ref 20–32)
Calcium: 9.2 mg/dL (ref 8.6–10.2)
Chloride: 105 mmol/L (ref 98–110)
Creat: 0.73 mg/dL (ref 0.50–1.10)
GFR, Est African American: 121 mL/min/{1.73_m2} (ref >=60)
GFR, Est Non African American: 104 mL/min/{1.73_m2} (ref >=60)
Globulin: 2 g/dL (calc) (ref 1.9–3.7)
Glucose, Bld: 72 mg/dL (ref 65–99)
Potassium: 3.8 mmol/L (ref 3.5–5.3)
Sodium: 138 mmol/L (ref 135–146)
Total Bilirubin: 0.7 mg/dL (ref 0.2–1.2)
Total Protein: 6.6 g/dL (ref 6.1–8.1)

## 2020-02-28 LAB — LIPID PANEL
Cholesterol: 216 mg/dL — ABNORMAL HIGH (ref <200)
HDL: 74 mg/dL (ref >=50)
LDL Cholesterol (Calc): 128 mg/dL (calc) — ABNORMAL HIGH
Non-HDL Cholesterol (Calc): 142 mg/dL (calc) — ABNORMAL HIGH (ref <130)
Total CHOL/HDL Ratio: 2.9 (calc) (ref <5.0)
Triglycerides: 46 mg/dL (ref <150)

## 2020-02-28 NOTE — Progress Notes (Signed)
[name] [address] [place] [address] Phone: [phone] Subjective:   [name], am serving as a scribe for Dr. Antoine Primas. This visit occurred during the SARS-CoV-2 public health emergency.  Safety protocols were in place, including screening questions prior to the visit, additional usage of staff PPE, and extensive cleaning of exam room while observing appropriate contact time as indicated for disinfecting solutions.   I'm seeing this patient by the request  of:  Everrett Coombe, DO  CC: Back pain follow-up  [id number]  Angela [name] is a 39 y.o. female coming in with complaint of back pain. Is having tightness but no flare ups since last visit. Last seen on 01/07/2020 for OMT.  Patient continues to have some discomfort overall.  Patient is able to workout on a regular basis.  Feels like she is in a good place at the moment.  Taking the ibuprofen only intermittently but continues Effexor.    Past Medical History:  Diagnosis Date  . Abnormal Pap smear    repeat ok  . Fibromyalgia 09/02/2017  . Herpes zoster   . Panic attacks   . PCOS (polycystic ovarian syndrome)    conceived on metformin and clomid  . PP SVD 12/02/11 M 12/03/2011   Past Surgical History:  Procedure Laterality Date  . BREAST BIOPSY Right    Benign  . WISDOM TOOTH EXTRACTION     Social History   Socioeconomic History  . Marital status: Married    Spouse name: Not on file  . Number of children: 1  . Years of education: 62  . Highest education level: Not on file  Occupational History  . Occupation: Systems developer  Tobacco Use  . Smoking status: Former Smoker    Quit date: 11/01/2009    Years since quitting: 10.3  . Smokeless tobacco: Never Used  Substance and Sexual Activity  . Alcohol use: Yes    Alcohol/week: 2.0 standard drinks    Types: 2 Cans of beer per week  . Drug use: No  . Sexual activity: Yes    Partners: Male    Birth  control/protection: I.U.D.  Other Topics Concern  . 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    Denies any religious beliefs effecting health care.    Lives with husband and son in a 2 story home.     Education: college.    Social Determinants of Health   Financial Resource Strain: Low Risk   . Difficulty of Paying Living Expenses: Not hard at all  Food Insecurity: No Food Insecurity  . Worried About Programme researcher, broadcasting/film/video in the Last Year: Never true  . Ran Out of Food in the Last Year: Never true  Transportation Needs: No Transportation Needs  . Lack of Transportation (Medical): No  . Lack of Transportation (Non-Medical): No  Physical Activity:   . Days of Exercise per Week:   . Minutes of Exercise per Session:   Stress: No Stress Concern Present  . Feeling of Stress : Only a little  Social Connections: Unknown  . Frequency of Communication with Friends and Family: Three times a week  . Frequency of Social Gatherings with Friends and Family: Three times a week  . Attends Religious Services: Not on file  . Active Member of Clubs or Organizations: Not on file  . Attends Banker Meetings: Not on file  .  Marital Status: Married   No Known Allergies Family History  Problem Relation Age of Onset  . Diabetes Father        uncontrolled  . Hypertension Father   . Hyperlipidemia Father   . Alcohol abuse Other   . Arthritis Other   . Ovarian cancer Paternal Grandmother   . Pancreatic cancer Cousin        paternal 2nd cousin  . Anesthesia problems Neg Hx        Current Outpatient Medications (Analgesics):  Marland Kitchen  Ibuprofen-Famotidine 800-26.6 MG TABS, Take 1 tablet by mouth 3 (three) times daily as needed.   Current Outpatient Medications (Other):  Marland Kitchen  ALPRAZolam (XANAX) 0.5 MG tablet, Take 1 tablet (0.5 mg total) by mouth daily as needed for anxiety or sleep. .  Clindamycin-Benzoyl Per, Refr, gel,  .   Multiple Vitamin (MULTIVITAMIN WITH MINERALS) TABS tablet, Take 1 tablet by mouth daily. .  Probiotic Product (PROBIOTIC ADVANCED PO), Take by mouth daily.  Marland Kitchen  tretinoin (RETIN-A) 0.025 % cream,  .  valACYclovir (VALTREX) 500 MG tablet, Take 500 mg by mouth as needed for lip care. .  venlafaxine XR (EFFEXOR XR) 37.5 MG 24 hr capsule, Take 3 capsules (112.5 mg total) by mouth daily with breakfast.   Reviewed prior external information including notes and imaging from  primary care provider As well as notes that were available from care everywhere and other healthcare systems.  Past medical history, social, surgical and family history all reviewed in electronic medical record.  No pertanent information unless stated regarding to the chief complaint.   Review of Systems:  No headache, visual changes, nausea, vomiting, diarrhea, constipation, dizziness, abdominal pain, skin rash, fevers, chills, night sweats, weight loss, swollen lymph nodes, body aches, joint swelling, chest pain, shortness of breath, mood changes. POSITIVE muscle aches  Objective  Blood pressure 112/78, height 5\' 7"  (1.702 m), weight 133 lb (60.3 kg).   General: No apparent distress alert and oriented x3 mood and affect normal, dressed appropriately.  HEENT: Pupils equal, extraocular movements intact  Respiratory: Patient's speak in full sentences and does not appear short of breath  Cardiovascular: No lower extremity edema, non tender, no erythema  Neuro: Cranial nerves II through XII are intact, neurovascularly intact in all extremities with 2+ DTRs and 2+ pulses.  Gait normal with good balance and coordination.  MSK:  Non tender with full range of motion and good stability and symmetric strength and tone of shoulders, elbows, wrist, hip, knee and ankles bilaterally.  Neck exam still shows patient does have hypermobility.  Patient does have tightness more in the thoracolumbar juncture as well as the sacroiliac joints with  tightness in the piriformis.  Patient is doing better with the strength of the hip abductors.  Osteopathic findings  T3 extended rotated and side bent right inhaled third rib T9 extended rotated and side bent left L2 flexed rotated and side bent right Sacrum right on right    Impression and Recommendations:     This case required medical decision making of moderate complexity. The above documentation has been reviewed and is accurate and complete Lyndal Pulley, DO       Note: This dictation was prepared with Dragon dictation along with smaller phrase technology. Any transcriptional errors that result from this process are unintentional.

## 2020-02-28 NOTE — Assessment & Plan Note (Signed)
Patient is doing relatively well.  Seems to be stable.  Responds well to manipulation.  Continue the Effexor.  Patient is working out but will suggested secondary to the hypermobility to avoid yoga as the main exercise treatment.  Discussed which activities to doing which wants to avoid.  Increase activity slowly.  Follow-up again in 4 to 8 weeks

## 2020-02-28 NOTE — Patient Instructions (Signed)
See me again in 7 weeks 

## 2020-02-28 NOTE — Assessment & Plan Note (Signed)

## 2020-03-21 ENCOUNTER — Encounter: Payer: Self-pay | Admitting: Family Medicine

## 2020-03-23 MED ORDER — VENLAFAXINE HCL ER 150 MG PO CP24: 150.0000 mg | ORAL_CAPSULE | Freq: Every day | ORAL | 3 refills | Status: DC

## 2020-03-24 MED FILL — VENLAFAXINE HCL ER 150 MG C: 150 | 30 days supply | Qty: 30 | Fill #0

## 2020-04-17 ENCOUNTER — Ambulatory Visit: Payer: No Typology Code available for payment source | Admitting: Family Medicine

## 2020-05-20 ENCOUNTER — Encounter: Payer: Self-pay | Admitting: Family Medicine

## 2020-05-22 MED ORDER — TRIAMCINOLONE ACETONIDE 0.5 % EX CREA
1.0000 | TOPICAL_CREAM | Freq: Two times a day (BID) | CUTANEOUS | 3 refills | Status: DC
Start: 2020-05-22 — End: 2020-05-22

## 2020-05-22 MED ORDER — TRIAMCINOLONE ACETONIDE 0.5 % EX CREA: 1.0000 "application " | TOPICAL_CREAM | Freq: Two times a day (BID) | CUTANEOUS | 3 refills | Status: DC

## 2020-05-22 MED FILL — TRIAMCINOLONE 0.5% CREAM: 0.5 | 14 days supply | Qty: 30 | Fill #0

## 2020-06-09 ENCOUNTER — Other Ambulatory Visit: Payer: Self-pay

## 2020-06-09 ENCOUNTER — Ambulatory Visit: Payer: PRIVATE HEALTH INSURANCE | Admitting: Family Medicine

## 2020-06-09 ENCOUNTER — Encounter: Payer: Self-pay | Admitting: Family Medicine

## 2020-06-09 VITALS — BP 112/82 | HR 88 | Ht 67.0 in | Wt 137.0 lb

## 2020-06-09 DIAGNOSIS — M357 Hypermobility syndrome: Secondary | ICD-10-CM | POA: Diagnosis not present

## 2020-06-09 DIAGNOSIS — M999 Biomechanical lesion, unspecified: Secondary | ICD-10-CM | POA: Diagnosis not present

## 2020-06-09 MED FILL — VENLAFAXINE HCL ER 150 MG C: 150 | 30 days supply | Qty: 30 | Fill #1

## 2020-06-09 NOTE — Patient Instructions (Signed)
Lots of clean up! Good to see you gain See me in 5 weeks

## 2020-06-09 NOTE — Assessment & Plan Note (Signed)
Chronic problem.  Patient did have an exacerbation of more of the fibromyalgia.  Doing relatively well with the Effexor.  New job and patient is now sitting more frequently.  Discussed different options that I think will be more beneficial.  States activity slowly.  Follow-up again in 5 weeks

## 2020-06-09 NOTE — Progress Notes (Signed)
[name] [address] [place] [address] Phone: [phone] Subjective:   Angela [name], am serving as a scribe for Dr. Antoine Primas. This visit occurred during the SARS-CoV-2 public health emergency.  Safety protocols were in place, including screening questions prior to the visit, additional usage of staff PPE, and extensive cleaning of exam room while observing appropriate contact time as indicated for disinfecting solutions.   I'm seeing this patient by the request  of:  Everrett Coombe, DO  CC: neck and back pain   OIN:[id number]  HILARY MILKS is a 39 y.o. female coming in with complaint of back and neck pain. OMT 02/28/2020. Patient states that she has had increase in pain since we haven't seen her in a while.  Patient is being very active overall.  Patient did switch jobs and that is why we have not seen her recently.  Has been traveling recently and has noticed more tightness of the lower back           Reviewed prior external information including notes and imaging from previsou exam, outside providers and external EMR if available.   As well as notes that were available from care everywhere and other healthcare systems.  Past medical history, social, surgical and family history all reviewed in electronic medical record.  No pertanent information unless stated regarding to the chief complaint.   Past Medical History:  Diagnosis Date  . Abnormal Pap smear    repeat ok  . Fibromyalgia 09/02/2017  . Herpes zoster   . Panic attacks   . PCOS (polycystic ovarian syndrome)    conceived on metformin and clomid  . PP SVD 12/02/11 M 12/03/2011    No Known Allergies   Review of Systems:  No headache, visual changes, nausea, vomiting, diarrhea, constipation, dizziness, abdominal pain, skin rash, fevers, chills, night sweats, weight loss, swollen lymph nodes, body aches, joint swelling, chest pain, shortness of breath, mood changes.  POSITIVE muscle aches  Objective  There were no vitals taken for this visit.   General: No apparent distress alert and oriented x3 mood and affect normal, dressed appropriately.  HEENT: Pupils equal, extraocular movements intact  Respiratory: Patient's speak in full sentences and does not appear short of breath  Cardiovascular: No lower extremity edema, non tender, no erythema  Neuro: Cranial nerves II through XII are intact, neurovascularly intact in all extremities with 2+ DTRs and 2+ pulses.  Gait normal with good balance and coordination.  MSK:  Non tender with full range of motion and good stability and symmetric strength and tone of shoulders, elbows, wrist, hip, knee and ankles bilaterally.  Back -hypermobility noted.  Discussed posture and ergonomics, patient has significant tightness with Pearlean Brownie which is new for her.  Negative straight leg test.  Osteopathic findings  T9 extended rotated and side bent left L2 flexed rotated and side bent right Sacrum right on right       Assessment and Plan: Hypermobility syndrome Chronic problem.  Patient did have an exacerbation of more of the fibromyalgia.  Doing relatively well with the Effexor.  New job and patient is now sitting more frequently.  Discussed different options that I think will be more beneficial.  States activity slowly.  Follow-up again in 5 weeks     Nonallopathic problems  Decision today to treat with OMT was based on Physical Exam  After verbal consent patient was treated with HVLA, ME, FPR techniques in  thoracic, lumbar, and sacral  areas  Patient tolerated the procedure well with improvement in symptoms  Patient given exercises, stretches and lifestyle modifications  See medications in patient instructions if given  Patient will follow up in 4-8 weeks      The above documentation has been reviewed and is accurate and complete Judi Saa, DO       Note: This dictation was prepared with Dragon  dictation along with smaller phrase technology. Any transcriptional errors that result from this process are unintentional.

## 2020-07-10 MED FILL — VALACYCLOVIR HCL 500 MG TAB: 500 | 30 days supply | Qty: 33 | Fill #0

## 2020-07-16 MED FILL — VENLAFAXINE HCL ER 150 MG C: 150 | 10 days supply | Qty: 10 | Fill #2

## 2020-07-19 ENCOUNTER — Encounter: Payer: Self-pay | Admitting: Family Medicine

## 2020-07-21 ENCOUNTER — Other Ambulatory Visit: Payer: Self-pay

## 2020-07-21 MED ORDER — VENLAFAXINE HCL ER 150 MG PO CP24: 150.0000 mg | ORAL_CAPSULE | Freq: Every day | ORAL | 3 refills | Status: DC

## 2020-07-21 MED FILL — VENLAFAXINE HCL ER 150 MG C: 150 | 30 days supply | Qty: 30 | Fill #3

## 2020-07-21 MED FILL — TRIAMCINOLONE 0.5% CREAM: 0.5 | 14 days supply | Qty: 30 | Fill #1

## 2020-07-24 ENCOUNTER — Ambulatory Visit: Payer: PRIVATE HEALTH INSURANCE | Admitting: Family Medicine

## 2020-08-07 ENCOUNTER — Encounter: Payer: Self-pay | Admitting: Family Medicine

## 2020-08-07 ENCOUNTER — Other Ambulatory Visit: Payer: Self-pay

## 2020-08-07 ENCOUNTER — Ambulatory Visit (INDEPENDENT_AMBULATORY_CARE_PROVIDER_SITE_OTHER): Payer: PRIVATE HEALTH INSURANCE | Admitting: Family Medicine

## 2020-08-07 DIAGNOSIS — G5701 Lesion of sciatic nerve, right lower limb: Secondary | ICD-10-CM | POA: Diagnosis not present

## 2020-08-07 DIAGNOSIS — M999 Biomechanical lesion, unspecified: Secondary | ICD-10-CM

## 2020-08-07 MED ORDER — VENLAFAXINE HCL ER 150 MG PO CP24: 150.0000 mg | ORAL_CAPSULE | Freq: Every day | ORAL | 3 refills | Status: DC

## 2020-08-07 NOTE — Progress Notes (Signed)
[name] [address] [place] [address] Phone: [phone] Subjective:   [name], am serving as a scribe for Dr. Antoine Primas. This visit occurred during the SARS-CoV-2 public health emergency.  Safety protocols were in place, including screening questions prior to the visit, additional usage of staff PPE, and extensive cleaning of exam room while observing appropriate contact time as indicated for disinfecting solutions.   I'm seeing this patient by the request  of:  Everrett Coombe, DO  CC: Low back pain follow-up  VEL:[id number]  Angela [name] is a 39 y.o. female coming in with complaint of back and neck pain. OMT 06/09/2020 Patient states that she notices a lot of stress in the shoulders. Hips are not as painful as she isn't getting up and down as much.   Medications patient has been prescribed: Effexor 150mg  Taking: Yes         Reviewed prior external information including notes and imaging from previsou exam, outside providers and external EMR if available.   As well as notes that were available from care everywhere and other healthcare systems.  Past medical history, social, surgical and family history all reviewed in electronic medical record.  No pertanent information unless stated regarding to the chief complaint.   Past Medical History:  Diagnosis Date  . Abnormal Pap smear    repeat ok  . Fibromyalgia 09/02/2017  . Herpes zoster   . Panic attacks   . PCOS (polycystic ovarian syndrome)    conceived on metformin and clomid  . PP SVD 12/02/11 M 12/03/2011    No Known Allergies   Review of Systems:  No headache, visual changes, nausea, vomiting, diarrhea, constipation, dizziness, abdominal pain, skin rash, fevers, chills, night sweats, weight loss, swollen lymph nodes, body aches, joint swelling, chest pain, shortness of breath, mood changes. POSITIVE muscle aches  Objective  Blood pressure 100/70, pulse (!) 115,  height 5\' 7"  (1.702 m), weight 136 lb (61.7 kg), SpO2 98 %.   General: No apparent distress alert and oriented x3 mood and affect normal, dressed appropriately.  HEENT: Pupils equal, extraocular movements intact  Respiratory: Patient's speak in full sentences and does not appear short of breath  Cardiovascular: No lower extremity edema, non tender, no erythema  Neuro: Cranial nerves II through XII are intact, neurovascularly intact in all extremities with 2+ DTRs and 2+ pulses.  Gait normal with good balance and coordination.  MSK:  Non tender with full range of motion and good stability and symmetric strength and tone of shoulders, elbows, wrist, hip, knee and ankles bilaterally.  Hypermobility noted Back Exam:  Inspection: loss or lordosis  Motion: Flexion 45 deg, Extension 25 deg, Side Bending to 25 deg bilaterally,  Rotation to 35 deg bilaterally  SLR laying: Negative  XSLR laying: Negative  Palpable tenderness: Tender to palpation in the paraspinal musculature mostly in the thoracolumbar juncture as well as the lumbosacral area.01/31/2012 FABER: negative. Sensory change: Gross sensation intact to all lumbar and sacral dermatomes.  Reflexes: 2+ at both patellar tendons, 2+ at achilles tendons, Babinski's downgoing.  Strength at foot  Plantar-flexion: 5/5 Dorsi-flexion: 5/5 Eversion: 5/5 Inversion: 5/5  Leg strength  Quad: 5/5 Hamstring: 5/5 Hip flexor: 5/5 Hip abductors: 5/5  Gait unremarkable.  Osteopathic findings  T4 extended rotated and side bent left L1 flexed rotated and side bent right Sacrum right on right       Assessment and Plan:  No problem-specific Assessment & Plan notes  found for this encounter.    Nonallopathic problems  Decision today to treat with OMT was based on Physical Exam  After verbal consent patient was treated with HVLA, ME, FPR techniques in cervical, rib, thoracic, lumbar, and sacral  areas  Patient tolerated the procedure well with improvement in  symptoms  Patient given exercises, stretches and lifestyle modifications  See medications in patient instructions if given  Patient will follow up in 4-8 weeks      The above documentation has been reviewed and is accurate and complete Judi Saa, DO       Note: This dictation was prepared with Dragon dictation along with smaller phrase technology. Any transcriptional errors that result from this process are unintentional.

## 2020-08-07 NOTE — Assessment & Plan Note (Signed)
Continue mild piriformis syndrome.  Does have a history of also the chronic pain and fibromyalgia.  Doing much better on the Effexor.  Patient has done better with the job change as well.  We discussed posture and ergonomics, patient will continue in follow-up with me again in 4 to 8 weeks

## 2020-08-07 NOTE — Patient Instructions (Signed)
See me again 6-8 weeks 

## 2020-08-22 ENCOUNTER — Ambulatory Visit (INDEPENDENT_AMBULATORY_CARE_PROVIDER_SITE_OTHER): Payer: PRIVATE HEALTH INSURANCE | Admitting: Family Medicine

## 2020-08-22 ENCOUNTER — Encounter: Payer: Self-pay | Admitting: Family Medicine

## 2020-08-22 VITALS — BP 125/65 | HR 89 | Temp 98.7°F | Ht 66.0 in | Wt 140.3 lb

## 2020-08-22 DIAGNOSIS — E782 Mixed hyperlipidemia: Secondary | ICD-10-CM

## 2020-08-22 DIAGNOSIS — F419 Anxiety disorder, unspecified: Secondary | ICD-10-CM

## 2020-08-22 DIAGNOSIS — I7300 Raynaud's syndrome without gangrene: Secondary | ICD-10-CM | POA: Insufficient documentation

## 2020-08-22 DIAGNOSIS — Z0000 Encounter for general adult medical examination without abnormal findings: Secondary | ICD-10-CM

## 2020-08-22 DIAGNOSIS — F32A Depression, unspecified: Secondary | ICD-10-CM

## 2020-08-22 MED ORDER — ALPRAZOLAM 0.5 MG PO TABS: 0.5000 mg | ORAL_TABLET | Freq: Every day | ORAL | 1 refills | Status: DC | PRN

## 2020-08-22 NOTE — Patient Instructions (Signed)
Preventive Care 38-39 Years Old, Female Preventive care refers to visits with your health care provider and lifestyle choices that can promote health and wellness. This includes:  A yearly physical exam. This may also be called an annual well check.  Regular dental visits and eye exams.  Immunizations.  Screening for certain conditions.  Healthy lifestyle choices, such as eating a healthy diet, getting regular exercise, not using drugs or products that contain nicotine and tobacco, and limiting alcohol use. What can I expect for my preventive care visit? Physical exam Your health care provider will check your:  Height and weight. This may be used to calculate body mass index (BMI), which tells if you are at a healthy weight.  Heart rate and blood pressure.  Skin for abnormal spots. Counseling Your health care provider may ask you questions about your:  Alcohol, tobacco, and drug use.  Emotional well-being.  Home and relationship well-being.  Sexual activity.  Eating habits.  Work and work environment.  Method of birth control.  Menstrual cycle.  Pregnancy history. What immunizations do I need?  Influenza (flu) vaccine  This is recommended every year. Tetanus, diphtheria, and pertussis (Tdap) vaccine  You may need a Td booster every 10 years. Varicella (chickenpox) vaccine  You may need this if you have not been vaccinated. Human papillomavirus (HPV) vaccine  If recommended by your health care provider, you may need three doses over 6 months. Measles, mumps, and rubella (MMR) vaccine  You may need at least one dose of MMR. You may also need a second dose. Meningococcal conjugate (MenACWY) vaccine  One dose is recommended if you are age 38-21 years and a first-year college student living in a residence hall, or if you have one of several medical conditions. You may also need additional booster doses. Pneumococcal conjugate (PCV13) vaccine  You may need  this if you have certain conditions and were not previously vaccinated. Pneumococcal polysaccharide (PPSV23) vaccine  You may need one or two doses if you smoke cigarettes or if you have certain conditions. Hepatitis A vaccine  You may need this if you have certain conditions or if you travel or work in places where you may be exposed to hepatitis A. Hepatitis B vaccine  You may need this if you have certain conditions or if you travel or work in places where you may be exposed to hepatitis B. Haemophilus influenzae type b (Hib) vaccine  You may need this if you have certain conditions. You may receive vaccines as individual doses or as more than one vaccine together in one shot (combination vaccines). Talk with your health care provider about the risks and benefits of combination vaccines. What tests do I need?  Blood tests  Lipid and cholesterol levels. These may be checked every 5 years starting at age 20.  Hepatitis C test.  Hepatitis B test. Screening  Diabetes screening. This is done by checking your blood sugar (glucose) after you have not eaten for a while (fasting).  Sexually transmitted disease (STD) testing.  BRCA-related cancer screening. This may be done if you have a family history of breast, ovarian, tubal, or peritoneal cancers.  Pelvic exam and Pap test. This may be done every 3 years starting at age 38. Starting at age 38, this may be done every 5 years if you have a Pap test in combination with an HPV test. Talk with your health care provider about your test results, treatment options, and if necessary, the need for more tests.   Follow these instructions at home: Eating and drinking   Eat a diet that includes fresh fruits and vegetables, whole grains, lean protein, and low-fat dairy.  Take vitamin and mineral supplements as recommended by your health care provider.  Do not drink alcohol if: ? Your health care provider tells you not to drink. ? You are  pregnant, may be pregnant, or are planning to become pregnant.  If you drink alcohol: ? Limit how much you have to 0-1 drink a day. ? Be aware of how much alcohol is in your drink. In the [place] one drink equals one 12 oz bottle of beer (355 mL), one 5 oz glass of wine (148 mL), or one 1 oz glass of hard liquor (44 mL). Lifestyle  Take daily care of your teeth and gums.  Stay active. Exercise for at least 30 minutes on 5 or more days each week.  Do not use any products that contain nicotine or tobacco, such as cigarettes, e-cigarettes, and chewing tobacco. If you need help quitting, ask your health care provider.  If you are sexually active, practice safe sex. Use a condom or other form of birth control (contraception) in order to prevent pregnancy and STIs (sexually transmitted infections). If you plan to become pregnant, see your health care provider for a preconception visit. What's next?  Visit your health care provider once a year for a well check visit.  Ask your health care provider how often you should have your eyes and teeth checked.  Stay up to date on all vaccines. This information is not intended to replace advice given to you by your health care provider. Make sure you discuss any questions you have with your health care provider. Document Revised: 06/29/2018 Document Reviewed: 06/29/2018 Elsevier Patient Education  2020 [employer].

## 2020-08-23 LAB — COMPLETE METABOLIC PANEL WITH GFR
AG Ratio: 2 (calc) (ref 1.0–2.5)
ALT: 16 U/L (ref 6–29)
AST: 17 U/L (ref 10–30)
Albumin: 4.5 g/dL (ref 3.6–5.1)
Alkaline phosphatase (APISO): 46 U/L (ref 31–125)
BUN: 13 mg/dL (ref 7–25)
CO2: 25 mmol/L (ref 20–32)
Calcium: 9.5 mg/dL (ref 8.6–10.2)
Chloride: 105 mmol/L (ref 98–110)
Creat: 0.71 mg/dL (ref 0.50–1.10)
GFR, Est African American: 125 mL/min/{1.73_m2} (ref >=60)
GFR, Est Non African American: 108 mL/min/{1.73_m2} (ref >=60)
Globulin: 2.2 g/dL (calc) (ref 1.9–3.7)
Glucose, Bld: 79 mg/dL (ref 65–99)
Potassium: 4.1 mmol/L (ref 3.5–5.3)
Sodium: 139 mmol/L (ref 135–146)
Total Bilirubin: 0.6 mg/dL (ref 0.2–1.2)
Total Protein: 6.7 g/dL (ref 6.1–8.1)

## 2020-08-23 LAB — CBC
HCT: 38.9 % (ref 35.0–45.0)
Hemoglobin: 13 g/dL (ref 11.7–15.5)
MCH: 29.8 pg (ref 27.0–33.0)
MCHC: 33.4 g/dL (ref 32.0–36.0)
MCV: 89.2 fL (ref 80.0–100.0)
MPV: 9.9 fL (ref 7.5–12.5)
Platelets: 330 10*3/uL (ref 140–400)
RBC: 4.36 10*6/uL (ref 3.80–5.10)
RDW: 11.8 % (ref 11.0–15.0)
WBC: 5.2 10*3/uL (ref 3.8–10.8)

## 2020-08-23 LAB — LIPID PANEL
Cholesterol: 216 mg/dL — ABNORMAL HIGH (ref <200)
HDL: 80 mg/dL (ref >=50)
LDL Cholesterol (Calc): 122 mg/dL (calc) — ABNORMAL HIGH
Non-HDL Cholesterol (Calc): 136 mg/dL (calc) — ABNORMAL HIGH (ref <130)
Total CHOL/HDL Ratio: 2.7 (calc) (ref <5.0)
Triglycerides: 52 mg/dL (ref <150)

## 2020-08-24 DIAGNOSIS — Z0000 Encounter for general adult medical examination without abnormal findings: Secondary | ICD-10-CM | POA: Insufficient documentation

## 2020-08-24 NOTE — Progress Notes (Signed)
Angela [name] - 39 y.o. female MRN [id number]  Date of birth: 10-17-1981  Subjective Chief Complaint  Patient presents with  . Annual Exam    HPI Angela [name] is a 39 y.o. female here today for annual exam.  She is doing well.  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   She has found this to be less stressful and really enjoying it.  She continues to see Dr. Katrinka Blazing for OMT.  Chronic pain is pretty well managed at this time.    She continues to stay pretty active as tolerated.  She fees like her diet is pretty good.   She is a non-smoker.  She has a couple of servings of EtOH each week.    She is up to date of flu and COVID vaccines.   Review of Systems  Constitutional: Negative for chills, fever, malaise/fatigue and weight loss.  HENT: Negative for congestion, ear pain and sore throat.   Eyes: Negative for blurred vision, double vision and pain.  Respiratory: Negative for cough and shortness of breath.   Cardiovascular: Negative for chest pain and palpitations.  Gastrointestinal: Negative for abdominal pain, blood in stool, constipation, heartburn and nausea.  Genitourinary: Negative for dysuria and urgency.  Musculoskeletal: Negative for joint pain and myalgias.  Neurological: Negative for dizziness and headaches.  Endo/Heme/Allergies: Does not bruise/bleed easily.  Psychiatric/Behavioral: Negative for depression. The patient is not nervous/anxious and does not have insomnia.     No Known Allergies  Past Medical History:  Diagnosis Date  . Abnormal Pap smear    repeat ok  . Fibromyalgia 09/02/2017  . Herpes zoster   . Panic attacks   . PCOS (polycystic ovarian syndrome)    conceived on metformin and clomid  . PP SVD 12/02/11 M 12/03/2011    Past Surgical History:  Procedure Laterality Date  . BREAST BIOPSY Right    Benign  . WISDOM TOOTH EXTRACTION      Social History   Socioeconomic History  .  Marital status: Married    Spouse name: Not on file  . Number of children: 1  . Years of education: 63  . Highest education level: Not on file  Occupational History  . Occupation: Systems developer  Tobacco Use  . Smoking status: Former Smoker    Quit date: 11/01/2009    Years since quitting: 10.8  . Smokeless tobacco: Never Used  Vaping Use  . Vaping Use: Never used  Substance and Sexual Activity  . Alcohol use: Yes    Alcohol/week: 2.0 standard drinks    Types: 2 Cans of beer per week  . Drug use: No  . Sexual activity: Yes    Partners: Male    Birth control/protection: I.U.D.  Other Topics Concern  . 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    Denies any religious beliefs effecting health care.    Lives with husband and son in a 2 story home.     Education: college.    Social Determinants of Health   Financial Resource Strain:   . Difficulty of Paying Living Expenses: Not on file  Food Insecurity:   . Worried About Programme researcher, broadcasting/film/video in the Last Year: Not on file  . Ran Out of Food in the Last Year: Not on file  Transportation Needs:   . Lack of Transportation (Medical): Not  on file  . Lack of Transportation (Non-Medical): Not on file  Physical Activity:   . Days of Exercise per Week: Not on file  . Minutes of Exercise per Session: Not on file  Stress:   . Feeling of Stress : Not on file  Social Connections:   . Frequency of Communication with Friends and Family: Not on file  . Frequency of Social Gatherings with Friends and Family: Not on file  . Attends Religious Services: Not on file  . Active Member of Clubs or Organizations: Not on file  . Attends Banker Meetings: Not on file  . Marital Status: Not on file    Family History  Problem Relation Age of Onset  . Diabetes Father        uncontrolled  . Hypertension Father   . Hyperlipidemia Father   . Alcohol abuse Other   .  Arthritis Other   . Ovarian cancer Paternal Grandmother   . Pancreatic cancer Cousin        paternal 2nd cousin  . Anesthesia problems Neg Hx     Health Maintenance  Topic Date Due  . Hepatitis C Screening  Never done  . INFLUENZA VACCINE  06/01/2020  . PAP SMEAR-Modifier  12/31/2022  . TETANUS/TDAP  08/18/2026  . COVID-19 Vaccine  Completed  . HIV Screening  Completed     ----------------------------------------------------------------------------------------------------------------------------------------------------------------------------------------------------------------- Physical Exam BP 125/65 (BP Location: Left Arm, Patient Position: Sitting, Cuff Size: Small)   Pulse 89   Temp 98.7 F (37.1 C)   Ht 5\' 6"  (1.676 m)   Wt 140 lb 4.8 oz (63.6 kg)   SpO2 100%   BMI 22.65 kg/m   Physical Exam Constitutional:      General: She is not in acute distress. HENT:     Head: Normocephalic and atraumatic.     Nose: Nose normal.  Eyes:     General: No scleral icterus.    Conjunctiva/sclera: Conjunctivae normal.  Neck:     Thyroid: No thyromegaly.  Cardiovascular:     Rate and Rhythm: Normal rate and regular rhythm.     Heart sounds: Normal heart sounds.  Pulmonary:     Effort: Pulmonary effort is normal.     Breath sounds: Normal breath sounds.  Abdominal:     General: Bowel sounds are normal. There is no distension.     Palpations: Abdomen is soft.     Tenderness: There is no abdominal tenderness. There is no guarding.  Musculoskeletal:        General: Normal range of motion.     Cervical back: Normal range of motion and neck supple.  Lymphadenopathy:     Cervical: No cervical adenopathy.  Skin:    General: Skin is warm and dry.     Findings: No rash.  Neurological:     Mental Status: She is alert and oriented to person, place, and time.     Cranial Nerves: No cranial nerve deficit.     Coordination: Coordination normal.  Psychiatric:        Behavior:  Behavior normal.     ------------------------------------------------------------------------------------------------------------------------------------------------------------------------------------------------------------------- Assessment and Plan  Well adult exam Well adult Orders Placed This Encounter  Procedures  . COMPLETE METABOLIC PANEL WITH GFR  . CBC  . Lipid Profile  Screening: UTD Immunizations: UTD Anticipatory guidance/risk factor reduction:  Recommendations per AVS   Meds ordered this encounter  Medications  . ALPRAZolam (XANAX) 0.5 MG tablet    Sig: Take 1 tablet (0.5 mg total)  by mouth daily as needed for anxiety or sleep.    Dispense:  20 tablet    Refill:  1    No follow-ups on file.    This visit occurred during the SARS-CoV-2 public health emergency.  Safety protocols were in place, including screening questions prior to the visit, additional usage of staff PPE, and extensive cleaning of exam room while observing appropriate contact time as indicated for disinfecting solutions.

## 2020-08-24 NOTE — Assessment & Plan Note (Signed)
Well adult Orders Placed This Encounter  Procedures  . COMPLETE METABOLIC PANEL WITH GFR  . CBC  . Lipid Profile  Screening: UTD Immunizations: UTD Anticipatory guidance/risk factor reduction:  Recommendations per AVS

## 2020-09-18 ENCOUNTER — Ambulatory Visit: Payer: PRIVATE HEALTH INSURANCE | Admitting: Family Medicine

## 2020-10-19 NOTE — Progress Notes (Signed)
[name] [address] [place] [address] Phone: [phone] Subjective:   I Angela [name] am serving as a Neurosurgeon for Dr. Antoine Primas.  This visit occurred during the SARS-CoV-2 public health emergency.  Safety protocols were in place, including screening questions prior to the visit, additional usage of staff PPE, and extensive cleaning of exam room while observing appropriate contact time as indicated for disinfecting solutions.   I'm seeing this patient by the request  of:  Everrett Coombe, DO  CC: Back and neck pain follow-up  [id number]  Angela [name] is a 39 y.o. female coming in with complaint of back and neck pain. OMT 08/07/2020. Patient states she has right knee pain as well. Hip pain that radiates down to the lateral knee. Numbness and tingling in the area. Walking up stairs she hears cracks in the knee. Believes it may be IT band. Pain is chronic and has been going on for 3 weeks. Has tried ibuprofen and heat. Hasn't noticed any swelling. 4-5/10 at its worse.   Medications patient has been prescribed: Effexor  Taking: Yes         Reviewed prior external information including notes and imaging from previsou exam, outside providers and external EMR if available.   As well as notes that were available from care everywhere and other healthcare systems.  Past medical history, social, surgical and family history all reviewed in electronic medical record.  No pertanent information unless stated regarding to the chief complaint.   Past Medical History:  Diagnosis Date  . Abnormal Pap smear    repeat ok  . Fibromyalgia 09/02/2017  . Herpes zoster   . Panic attacks   . PCOS (polycystic ovarian syndrome)    conceived on metformin and clomid  . PP SVD 12/02/11 M 12/03/2011    No Known Allergies   Review of Systems:  No headache, visual changes, nausea, vomiting, diarrhea, constipation, dizziness, abdominal pain, skin rash,  fevers, chills, night sweats, weight loss, swollen lymph nodes, body aches, joint swelling, chest pain, shortness of breath, mood changes. POSITIVE muscle aches  Objective  Blood pressure 110/80, pulse (!) 105, height 5\' 6"  (1.676 m), weight 142 lb (64.4 kg), SpO2 98 %.   General: No apparent distress alert and oriented x3 mood and affect normal, dressed appropriately.  HEENT: Pupils equal, extraocular movements intact  Respiratory: Patient's speak in full sentences and does not appear short of breath  Cardiovascular: No lower extremity edema, non tender, no erythema  Neuro: Cranial nerves II through XII are intact, neurovascularly intact in all extremities with 2+ DTRs and 2+ pulses.  Gait normal with good balance and coordination.  MSK:  Non tender with full range of motion and good stability and symmetric strength and tone of shoulders, elbows, wrist, hip, knee and ankles bilaterally.  Hypermobility noted Back -patient does have some mild loss of lordosis.  Tightness of the paraspinal musculature right sacrum more than the left.  Mild pain over the greater trochanteric area.  Negative straight leg test.  Lacks last 5 degrees of extension.  Osteopathic findings   T7 extended rotated and side bent left L2 flexed rotated and side bent right Sacrum right on right   Limited musculoskeletal ultrasound was performed and interpreted by  Limited ultrasound of patient's knee fairly unremarkable.  No significant swelling at this time.  Mild lateral degenerative changes of the patellofemoral joint but mild overall.  Meniscus appear to be unremarkable medial  and lateral. Impression: Questionable signs that is consistent with patellofemoral syndrome versus mild arthritis    Assessment and Plan:  Patellofemoral syndrome of right knee Reviewed anatomy using anatomical model and how PFS occurs.  Given rehab exercises handout for VMO, hip abductors, core, entire kinetic chain  including proprioception exercises.  Could benefit from PT, regular exercise, upright biking, and a PFS knee brace to assist with tracking abnormalities. Patient given a brace today.  Follow-up again in 4 to 8 weeks worsening pain can consider physical therapy or injections  Hypermobility syndrome Stable.  Responding well to manipulation.  Discussed posture and ergonomics.  Follow-up again 4 to 8 weeks    Nonallopathic problems  Decision today to treat with OMT was based on Physical Exam  After verbal consent patient was treated with HVLA, ME, FPR techniques in thoracic, lumbar, and sacral  areas  Patient tolerated the procedure well with improvement in symptoms  Patient given exercises, stretches and lifestyle modifications  See medications in patient instructions if given  Patient will follow up in 4-8 weeks      The above documentation has been reviewed and is accurate and complete Judi Saa, DO       Note: This dictation was prepared with Dragon dictation along with smaller phrase technology. Any transcriptional errors that result from this process are unintentional.

## 2020-10-21 ENCOUNTER — Ambulatory Visit: Payer: PRIVATE HEALTH INSURANCE | Admitting: Family Medicine

## 2020-10-21 ENCOUNTER — Encounter: Payer: Self-pay | Admitting: Family Medicine

## 2020-10-21 ENCOUNTER — Ambulatory Visit (INDEPENDENT_AMBULATORY_CARE_PROVIDER_SITE_OTHER): Payer: PRIVATE HEALTH INSURANCE

## 2020-10-21 ENCOUNTER — Ambulatory Visit: Payer: Self-pay

## 2020-10-21 ENCOUNTER — Other Ambulatory Visit: Payer: Self-pay

## 2020-10-21 VITALS — BP 110/80 | HR 105 | Ht 66.0 in | Wt 142.0 lb

## 2020-10-21 DIAGNOSIS — M222X1 Patellofemoral disorders, right knee: Secondary | ICD-10-CM | POA: Diagnosis not present

## 2020-10-21 DIAGNOSIS — G8929 Other chronic pain: Secondary | ICD-10-CM

## 2020-10-21 DIAGNOSIS — M357 Hypermobility syndrome: Secondary | ICD-10-CM | POA: Diagnosis not present

## 2020-10-21 DIAGNOSIS — M999 Biomechanical lesion, unspecified: Secondary | ICD-10-CM | POA: Diagnosis not present

## 2020-10-21 DIAGNOSIS — M25561 Pain in right knee: Secondary | ICD-10-CM

## 2020-10-21 NOTE — Assessment & Plan Note (Signed)
Stable.  Responding well to manipulation.  Discussed posture and ergonomics.  Follow-up again 4 to 8 weeks

## 2020-10-21 NOTE — Assessment & Plan Note (Signed)
Reviewed anatomy using anatomical model and how PFS occurs.  Given rehab exercises handout for VMO, hip abductors, core, entire kinetic chain including proprioception exercises.  Could benefit from PT, regular exercise, upright biking, and a PFS knee brace to assist with tracking abnormalities. Patient given a brace today.  Follow-up again in 4 to 8 weeks worsening pain can consider physical therapy or injections

## 2020-10-21 NOTE — Patient Instructions (Addendum)
Good to see you Knee brace today Exercise 3 times a week Pennsaid on the knee Xray today See me again in 6-8 weeks

## 2020-12-02 NOTE — Progress Notes (Signed)
[name] [address] [place] [address] Phone: [phone] Subjective:   I Angela [name] am serving as a Neurosurgeon for Dr. Antoine Primas.  This visit occurred during the SARS-CoV-2 public health emergency.  Safety protocols were in place, including screening questions prior to the visit, additional usage of staff PPE, and extensive cleaning of exam room while observing appropriate contact time as indicated for disinfecting solutions.   I'm seeing this patient by the request  of:  Everrett Coombe, DO  CC: Back pain and knee pain follow-up  [id number]  Angela [name] is a 40 y.o. female coming in with complaint of back and neck and knee pain. Patient states that everything has been ok. Knee is much better and is consistently wearing the knee.  Patient states that the pain is 85 to 90% better in the knee.  States that now the brace is actually getting more irritating.          Reviewed prior external information including notes and imaging from previsou exam, outside providers and external EMR if available.   As well as notes that were available from care everywhere and other healthcare systems.  Past medical history, social, surgical and family history all reviewed in electronic medical record.  No pertanent information unless stated regarding to the chief complaint.   Past Medical History:  Diagnosis Date  . Abnormal Pap smear    repeat ok  . Fibromyalgia 09/02/2017  . Herpes zoster   . Panic attacks   . PCOS (polycystic ovarian syndrome)    conceived on metformin and clomid  . PP SVD 12/02/11 M 12/03/2011    No Known Allergies   Review of Systems:  No headache, visual changes, nausea, vomiting, diarrhea, constipation, dizziness, abdominal pain, skin rash, fevers, chills, night sweats, weight loss, swollen lymph nodes,  joint swelling, chest pain, shortness of breath, mood changes. POSITIVE muscle aches, body aches  Objective   Blood pressure 130/70, pulse (!) 111, height 5\' 6"  (1.676 m), weight 145 lb (65.8 kg), SpO2 100 %.   General: No apparent distress alert and oriented x3 mood and affect normal, dressed appropriately.  HEENT: Pupils equal, extraocular movements intact  Respiratory: Patient's speak in full sentences and does not appear short of breath  Cardiovascular: No lower extremity edema, non tender, no erythema   Gait normal with good balance and coordination.  MSK: Hypomobility in multiple joints. Knee exam still shows very mild lateral tracking of the right patella.  Mild positive grind test.  No crepitus noted with range of motion which is improving.  Good stability of the knee otherwise. Back mild tightness in the thoracolumbar as well as mild tightness at the right sacroiliac point.  Patient does have some discomfort with right greater than left.  5 out of 5 strength of the lower extremity  Osteopathic findings   T6 extended rotated and side bent left L1 flexed rotated and side bent right Sacrum right on right       Assessment and Plan:  Hypermobility syndrome Hypermobility syndrome not responding very well to osteopathic manipulation.  Patient has done well with 3 different medications.  No significant vision changes at this time.  Patient will follow up with me again in 6 to 8 weeks  Patellofemoral syndrome of right knee Improvement noted from previous exam.  No significant grinding noted.  X-rays were unremarkable at last exam.  Only use the brace now for working out and then as needed.  Nonallopathic problems  Decision today to treat with OMT was based on Physical Exam  After verbal consent patient was treated with HVLA, ME, FPR techniques in thoracic, lumbar, and sacral  areas  Patient tolerated the procedure well with improvement in symptoms  Patient given exercises, stretches and lifestyle modifications  See medications in patient instructions if given  Patient  will follow up in 6-8 weeks      The above documentation has been reviewed and is accurate and complete Judi Saa, DO       Note: This dictation was prepared with Dragon dictation along with smaller phrase technology. Any transcriptional errors that result from this process are unintentional.

## 2020-12-03 ENCOUNTER — Encounter: Payer: Self-pay | Admitting: Family Medicine

## 2020-12-03 ENCOUNTER — Ambulatory Visit: Payer: PRIVATE HEALTH INSURANCE | Admitting: Family Medicine

## 2020-12-03 ENCOUNTER — Other Ambulatory Visit: Payer: Self-pay

## 2020-12-03 VITALS — BP 130/70 | HR 111 | Ht 66.0 in | Wt 145.0 lb

## 2020-12-03 DIAGNOSIS — M222X1 Patellofemoral disorders, right knee: Secondary | ICD-10-CM

## 2020-12-03 DIAGNOSIS — M357 Hypermobility syndrome: Secondary | ICD-10-CM | POA: Diagnosis not present

## 2020-12-03 DIAGNOSIS — M999 Biomechanical lesion, unspecified: Secondary | ICD-10-CM

## 2020-12-03 NOTE — Assessment & Plan Note (Signed)
Hypermobility syndrome not responding very well to osteopathic manipulation.  Patient has done well with 3 different medications.  No significant vision changes at this time.  Patient will follow up with me again in 6 to 8 weeks

## 2020-12-03 NOTE — Patient Instructions (Signed)
Good to see you Look great Knee only wear brace with exercise See me again in 6-7 weeks for the back

## 2020-12-03 NOTE — Assessment & Plan Note (Signed)
Improvement noted from previous exam.  No significant grinding noted.  X-rays were unremarkable at last exam.  Only use the brace now for working out and then as needed.

## 2020-12-20 ENCOUNTER — Other Ambulatory Visit: Payer: Self-pay | Admitting: Family Medicine

## 2021-01-14 ENCOUNTER — Ambulatory Visit: Payer: PRIVATE HEALTH INSURANCE | Admitting: Family Medicine

## 2021-01-29 NOTE — Progress Notes (Signed)
[name] [address] [place] [address] Phone: [phone] Subjective:   Angela [name], am serving as a scribe for Dr. Antoine Primas. This visit occurred during the SARS-CoV-2 public health emergency.  Safety protocols were in place, including screening questions prior to the visit, additional usage of staff PPE, and extensive cleaning of exam room while observing appropriate contact time as indicated for disinfecting solutions.   I'm seeing this patient by the request  of:  Everrett Coombe, DO  CC: Neck and back pain  [id number]  Angela [name] is a 40 y.o. female coming in with complaint of back and neck pain. OMT 12/03/2020. Patient states that her back is doing fine, but she has had to cancel the last couple of appointment so she is just ready to be adjusted.   Medications patient has been prescribed: Effexor 150mg   Taking: yes          Reviewed prior external information including notes and imaging from previsou exam, outside providers and external EMR if available.   As well as notes that were available from care everywhere and other healthcare systems.  Past medical history, social, surgical and family history all reviewed in electronic medical record.  No pertanent information unless stated regarding to the chief complaint.   Past Medical History:  Diagnosis Date  . Abnormal Pap smear    repeat ok  . Fibromyalgia 09/02/2017  . Herpes zoster   . Panic attacks   . PCOS (polycystic ovarian syndrome)    conceived on metformin and clomid  . PP SVD 12/02/11 M 12/03/2011    No Known Allergies   Review of Systems:  No headache, visual changes, nausea, vomiting, diarrhea, constipation, dizziness, abdominal pain, skin rash, fevers, chills, night sweats, weight loss, swollen lymph nodes, body aches, joint swelling, chest pain, shortness of breath, mood changes. POSITIVE muscle aches  Objective  Blood pressure 134/80, pulse (!)  112, height 5\' 6"  (1.676 m), weight 143 lb (64.9 kg), SpO2 98 %.   General: No apparent distress alert and oriented x3 mood and affect normal, dressed appropriately.  HEENT: Pupils equal, extraocular movements intact  Respiratory: Patient's speak in full sentences and does not appear short of breath  Cardiovascular: No lower extremity edema, non tender, no erythema  Gait normal with good balance and coordination.  MSK:  Non tender with full range of motion and good stability and symmetric strength and tone of shoulders, elbows, wrist, hip, knee and ankles bilaterally.  Back -hypermobility noted low back exam does have some loss of lordosis.  Some tenderness to palpation in the paraspinal musculature in the lumbar spine right greater than left.  Osteopathic findings  C2 flexed rotated and side bent right T3 extended rotated and side bent right inhaled rib T7 extended rotated and side bent left L2 flexed rotated and side bent right Sacrum right on right       Assessment and Plan:  Chronic pain syndrome Chronic pain syndrome.  Discussed with patient about continuing the Effexor at this moment.  Patient has done relatively well overall.  He does have the hypermobility.  Mild pain discomfort from time to time but nothing severe.  Patient will follow up with me again 6 to 8 weeks    Nonallopathic problems  Decision today to treat with OMT was based on Physical Exam  After verbal consent patient was treated with HVLA, ME, FPR techniques in cervical, rib, thoracic, lumbar, and sacral  areas  Patient tolerated the procedure well with improvement in symptoms  Patient given exercises, stretches and lifestyle modifications  See medications in patient instructions if given  Patient will follow up in 4-8 weeks      The above documentation has been reviewed and is accurate and complete Judi Saa, DO       Note: This dictation was prepared with Dragon dictation along with  smaller phrase technology. Any transcriptional errors that result from this process are unintentional.

## 2021-01-30 ENCOUNTER — Other Ambulatory Visit: Payer: Self-pay

## 2021-01-30 ENCOUNTER — Encounter: Payer: Self-pay | Admitting: Family Medicine

## 2021-01-30 ENCOUNTER — Ambulatory Visit: Payer: PRIVATE HEALTH INSURANCE | Admitting: Family Medicine

## 2021-01-30 VITALS — BP 134/80 | HR 112 | Ht 66.0 in | Wt 143.0 lb

## 2021-01-30 DIAGNOSIS — G894 Chronic pain syndrome: Secondary | ICD-10-CM

## 2021-01-30 DIAGNOSIS — M999 Biomechanical lesion, unspecified: Secondary | ICD-10-CM

## 2021-01-30 NOTE — Patient Instructions (Addendum)
Good to see you  [facility]  Glad you are doing so well  See me again in 3 months

## 2021-01-30 NOTE — Assessment & Plan Note (Signed)
Chronic pain syndrome.  Discussed with patient about continuing the Effexor at this moment.  Patient has done relatively well overall.  He does have the hypermobility.  Mild pain discomfort from time to time but nothing severe.  Patient will follow up with me again 6 to 8 weeks

## 2021-03-27 ENCOUNTER — Other Ambulatory Visit (HOSPITAL_BASED_OUTPATIENT_CLINIC_OR_DEPARTMENT_OTHER): Payer: Self-pay

## 2021-03-27 MED ORDER — TRIAMCINOLONE ACETONIDE 0.5 % EX CREA
TOPICAL_CREAM | CUTANEOUS | 3 refills | Status: AC
  Filled 2021-03-27: qty 30, 14d supply, fill #0

## 2021-04-15 ENCOUNTER — Other Ambulatory Visit: Payer: Self-pay | Admitting: Family Medicine

## 2021-04-29 NOTE — Progress Notes (Deleted)
  [name] [address] [place] [address] Phone: [phone] Subjective:    I'm seeing this patient by the request  of:  Everrett Coombe, DO  CC:   [id number]  JOLIET MALLOZZI is a 40 y.o. female coming in with complaint of back and neck pain. OMT 01/30/2021. Patient states   Medications patient has been prescribed: Effexor  Taking:         Reviewed prior external information including notes and imaging from previsou exam, outside providers and external EMR if available.   As well as notes that were available from care everywhere and other healthcare systems.  Past medical history, social, surgical and family history all reviewed in electronic medical record.  No pertanent information unless stated regarding to the chief complaint.   Past Medical History:  Diagnosis Date   Abnormal Pap smear    repeat ok   Fibromyalgia 09/02/2017   Herpes zoster    Panic attacks    PCOS (polycystic ovarian syndrome)    conceived on metformin and clomid   PP SVD 12/02/11 M 12/03/2011    No Known Allergies   Review of Systems:  No headache, visual changes, nausea, vomiting, diarrhea, constipation, dizziness, abdominal pain, skin rash, fevers, chills, night sweats, weight loss, swollen lymph nodes, body aches, joint swelling, chest pain, shortness of breath, mood changes. POSITIVE muscle aches  Objective  There were no vitals taken for this visit.   General: No apparent distress alert and oriented x3 mood and affect normal, dressed appropriately.  HEENT: Pupils equal, extraocular movements intact  Respiratory: Patient's speak in full sentences and does not appear short of breath  Cardiovascular: No lower extremity edema, non tender, no erythema  Neuro: Cranial nerves II through XII are intact, neurovascularly intact in all extremities with 2+ DTRs and 2+ pulses.  Gait normal with good balance and coordination.  MSK:  Non tender with full range  of motion and good stability and symmetric strength and tone of shoulders, elbows, wrist, hip, knee and ankles bilaterally.  Back - Normal skin, Spine with normal alignment and no deformity.  No tenderness to vertebral process palpation.  Paraspinous muscles are not tender and without spasm.   Range of motion is full at neck and lumbar sacral regions  Osteopathic findings  C2 flexed rotated and side bent right C6 flexed rotated and side bent left T3 extended rotated and side bent right inhaled rib T9 extended rotated and side bent left L2 flexed rotated and side bent right Sacrum right on right       Assessment and Plan:    Nonallopathic problems  Decision today to treat with OMT was based on Physical Exam  After verbal consent patient was treated with HVLA, ME, FPR techniques in cervical, rib, thoracic, lumbar, and sacral  areas  Patient tolerated the procedure well with improvement in symptoms  Patient given exercises, stretches and lifestyle modifications  See medications in patient instructions if given  Patient will follow up in 4-8 weeks      The above documentation has been reviewed and is accurate and complete Wilford Grist       Note: This dictation was prepared with Dragon dictation along with smaller phrase technology. Any transcriptional errors that result from this process are unintentional.

## 2021-04-30 ENCOUNTER — Ambulatory Visit: Payer: PRIVATE HEALTH INSURANCE | Admitting: Family Medicine

## 2021-05-18 NOTE — Progress Notes (Signed)
[name] [address] [place] [address] Phone: [phone] Subjective:   [name], am serving as a scribe for Dr. Antoine Primas.  This visit occurred during the SARS-CoV-2 public health emergency.  Safety protocols were in place, including screening questions prior to the visit, additional usage of staff PPE, and extensive cleaning of exam room while observing appropriate contact time as indicated for disinfecting solutions.    I'm seeing this patient by the request  of:  Everrett Coombe, DO  CC: Back and neck pain follow-up  [id number]  LOTTA FRANKENFIELD is a 40 y.o. female coming in with complaint of back and neck pain. OMT 01/30/2021. Patient states that she feels like she holds her stress on R side. Tightness in R shoulder down to R sacrum. Takes IBU prn.   Medications patient has been prescribed: Effexor  Taking: Yes         Reviewed prior external information including notes and imaging from previsou exam, outside providers and external EMR if available.   As well as notes that were available from care everywhere and other healthcare systems.  Past medical history, social, surgical and family history all reviewed in electronic medical record.  No pertanent information unless stated regarding to the chief complaint.   Past Medical History:  Diagnosis Date   Abnormal Pap smear    repeat ok   Fibromyalgia 09/02/2017   Herpes zoster    Panic attacks    PCOS (polycystic ovarian syndrome)    conceived on metformin and clomid   PP SVD 12/02/11 M 12/03/2011    No Known Allergies   Review of Systems:  No headache, visual changes, nausea, vomiting, diarrhea, constipation, dizziness, abdominal pain, skin rash, fevers, chills, night sweats, weight loss, swollen lymph nodes, joint swelling, chest pain, shortness of breath, mood changes. POSITIVE muscle aches, body aches  Objective  Blood pressure 110/72, pulse (!) 109, height 5'  6" (1.676 m), weight 138 lb (62.6 kg), SpO2 98 %.   General: No apparent distress alert and oriented x3 mood and affect normal, dressed appropriately.  HEENT: Pupils equal, extraocular movements intact  Respiratory: Patient's speak in full sentences and does not appear short of breath  Cardiovascular: No lower extremity edema, non tender, no erythema  Back exam shows hypermobility noted.  Patient does have some loss of lordosis of the lumbar spine.  FABER test.  Mild pain with paraspinal musculature tenderness more on the right thoracolumbar junction on the right sacroiliac joint.  5 out of 5 strength in lower extremities  Osteopathic findings  C2 flexed rotated and side bent right C7 flexed rotated and side bent left T3 extended rotated and side bent right inhaled rib T7 extended rotated and side bent left L1 flexed rotated and side bent right Sacrum right on right       Assessment and Plan:  Chronic pain syndrome Chronic problem with mild exacerbation.  Some evidence seems to be stress related.  No change in medication at the moment.  Discussed icing regimen and home exercises.  Continue the Effexor.  Patient to follow-up with me again in 6 weeks.   Nonallopathic problems  Decision today to treat with OMT was based on Physical Exam  After verbal consent patient was treated with HVLA, ME, FPR techniques in cervical, rib, thoracic, lumbar, and sacral  areas  Patient tolerated the procedure well with improvement in symptoms  Patient given exercises, stretches and lifestyle modifications  See medications in  patient instructions if given  Patient will follow up in 4-8 weeks     The above documentation has been reviewed and is accurate and complete Judi Saa, DO        Note: This dictation was prepared with Dragon dictation along with smaller phrase technology. Any transcriptional errors that result from this process are unintentional.

## 2021-05-19 ENCOUNTER — Encounter: Payer: Self-pay | Admitting: Family Medicine

## 2021-05-19 ENCOUNTER — Other Ambulatory Visit: Payer: Self-pay

## 2021-05-19 ENCOUNTER — Ambulatory Visit: Payer: No Typology Code available for payment source | Admitting: Family Medicine

## 2021-05-19 VITALS — BP 110/72 | HR 109 | Ht 66.0 in | Wt 138.0 lb

## 2021-05-19 DIAGNOSIS — M9901 Segmental and somatic dysfunction of cervical region: Secondary | ICD-10-CM

## 2021-05-19 DIAGNOSIS — M9908 Segmental and somatic dysfunction of rib cage: Secondary | ICD-10-CM

## 2021-05-19 DIAGNOSIS — G894 Chronic pain syndrome: Secondary | ICD-10-CM

## 2021-05-19 DIAGNOSIS — M9903 Segmental and somatic dysfunction of lumbar region: Secondary | ICD-10-CM | POA: Diagnosis not present

## 2021-05-19 DIAGNOSIS — M9902 Segmental and somatic dysfunction of thoracic region: Secondary | ICD-10-CM | POA: Diagnosis not present

## 2021-05-19 DIAGNOSIS — M9904 Segmental and somatic dysfunction of sacral region: Secondary | ICD-10-CM

## 2021-05-19 NOTE — Patient Instructions (Signed)
Good to see you Hopefully work will slow down Have a great trip See me again in 6-8 weeks

## 2021-05-19 NOTE — Assessment & Plan Note (Signed)
Chronic problem with mild exacerbation.  Some evidence seems to be stress related.  No change in medication at the moment.  Discussed icing regimen and home exercises.  Continue the Effexor.  Patient to follow-up with me again in 6 weeks.

## 2021-07-23 NOTE — Progress Notes (Signed)
  [name] [address] [place] [address] Phone: [phone] Subjective:   [name], am serving as a scribe for Dr. Antoine Primas. This visit occurred during the SARS-CoV-2 public health emergency.  Safety protocols were in place, including screening questions prior to the visit, additional usage of staff PPE, and extensive cleaning of exam room while observing appropriate contact time as indicated for disinfecting solutions.   I'm seeing this patient by the request  of:  Everrett Coombe, DO  CC: Low back pain follow-up  [id number]  Angela [name] is a 40 y.o. female coming in with complaint of back and neck pain. OMT on 05/19/2021. Patient states back and neck pain remain the same. No new complaints.  Patient did travel and had some more increasing discomfort with having a long drive.  Some more tightness than anything else.  No new complaints as stated above  Medications patient has been prescribed: Effexor  Taking: Yes       Past Medical History:  Diagnosis Date   Abnormal Pap smear    repeat ok   Fibromyalgia 09/02/2017   Herpes zoster    Panic attacks    PCOS (polycystic ovarian syndrome)    conceived on metformin and clomid   PP SVD 12/02/11 M 12/03/2011    No Known Allergies   Review of Systems:  No headache, visual changes, nausea, vomiting, diarrhea, constipation, dizziness, abdominal pain, skin rash, fevers, chills, night sweats, weight loss, swollen lymph nodes, body aches, joint swelling, chest pain, shortness of breath, mood changes. POSITIVE muscle aches  Objective  Blood pressure 116/78, pulse 98, height 5\' 6"  (1.676 m), weight 145 lb (65.8 kg), SpO2 98 %.   General: No apparent distress alert and oriented x3 mood and affect normal, dressed appropriately.  HEENT: Pupils equal, extraocular movements intact  Respiratory: Patient's speak in full sentences and does not appear short of breath  Cardiovascular: No  lower extremity edema, non tender, no erythema  Patient does have hypermobility noted.  Still does have tightness noted in the hip flexor bilaterally. Patient's continues to have more tightness with the right side in the parascapular region as well.  Osteopathic findings  T3 extended rotated and side bent right inhaled rib T7 extended rotated and side bent left L2 flexed rotated and side bent right Sacrum right on right       Assessment and Plan:  Hypermobility syndrome Hypermobility.  Still doing relatively well.  Does respond well to osteopathic manipulation.  Underlying chronic pain and fibromyalgia.  Responding very well though to the Effexor.  Discussed which activities to do which wants to avoid.  Increasing activity slowly.  Follow-up with me again in 2 months   Nonallopathic problems  Decision today to treat with OMT was based on Physical Exam  After verbal consent patient was treated with HVLA, ME, FPR techniques in  thoracic, lumbar, and sacral  areas  Patient tolerated the procedure well with improvement in symptoms  Patient given exercises, stretches and lifestyle modifications  See medications in patient instructions if given  Patient will follow up in 4-8 weeks      The above documentation has been reviewed and is accurate and complete , DO        Note: This dictation was prepared with Dragon dictation along with smaller phrase technology. Any transcriptional errors that result from this process are unintentional.

## 2021-07-24 ENCOUNTER — Other Ambulatory Visit: Payer: Self-pay

## 2021-07-24 ENCOUNTER — Ambulatory Visit: Payer: No Typology Code available for payment source | Admitting: Family Medicine

## 2021-07-24 ENCOUNTER — Encounter: Payer: Self-pay | Admitting: Family Medicine

## 2021-07-24 ENCOUNTER — Other Ambulatory Visit: Payer: Self-pay | Admitting: Family Medicine

## 2021-07-24 VITALS — BP 116/78 | HR 98 | Ht 66.0 in | Wt 145.0 lb

## 2021-07-24 DIAGNOSIS — M9902 Segmental and somatic dysfunction of thoracic region: Secondary | ICD-10-CM

## 2021-07-24 DIAGNOSIS — M9904 Segmental and somatic dysfunction of sacral region: Secondary | ICD-10-CM

## 2021-07-24 DIAGNOSIS — M357 Hypermobility syndrome: Secondary | ICD-10-CM

## 2021-07-24 DIAGNOSIS — M9903 Segmental and somatic dysfunction of lumbar region: Secondary | ICD-10-CM

## 2021-07-24 MED ORDER — VENLAFAXINE HCL ER 150 MG PO CP24: 150.0000 mg | ORAL_CAPSULE | Freq: Every day | ORAL | 3 refills | Status: DC

## 2021-07-24 NOTE — Assessment & Plan Note (Signed)
Hypermobility.  Still doing relatively well.  Does respond well to osteopathic manipulation.  Underlying chronic pain and fibromyalgia.  Responding very well though to the Effexor.  Discussed which activities to do which wants to avoid.  Increasing activity slowly.  Follow-up with me again in 2 months

## 2021-07-24 NOTE — Patient Instructions (Signed)
Good to see you! Glad you survived [place] See you again in 2 months

## 2021-08-25 ENCOUNTER — Encounter: Payer: No Typology Code available for payment source | Admitting: Family Medicine

## 2021-08-28 ENCOUNTER — Encounter: Payer: No Typology Code available for payment source | Admitting: Family Medicine

## 2021-09-18 ENCOUNTER — Ambulatory Visit: Payer: No Typology Code available for payment source | Admitting: Family Medicine

## 2021-11-05 NOTE — Progress Notes (Signed)
[name] [address] [place] [address] Phone: [phone] Subjective:   Angela [name], am serving as a scribe for Dr. Antoine Primas. This visit occurred during the SARS-CoV-2 public health emergency.  Safety protocols were in place, including screening questions prior to the visit, additional usage of staff PPE, and extensive cleaning of exam room while observing appropriate contact time as indicated for disinfecting solutions.   Angela'm seeing this patient by the request  of:  Everrett Coombe, DO  CC: Neck and low back pain follow-up  [id number]  Angela [name] is a 41 y.o. female coming in with complaint of back and neck pain. OMT on 07/24/2021. Patient states right half of body. No new complaints.  Medications patient has been prescribed: Effexor  Taking:         Reviewed prior external information including notes and imaging from previsou exam, outside providers and external EMR if available.   As well as notes that were available from care everywhere and other healthcare systems.  Past medical history, social, surgical and family history all reviewed in electronic medical record.  No pertanent information unless stated regarding to the chief complaint.   Past Medical History:  Diagnosis Date   Abnormal Pap smear    repeat ok   Fibromyalgia 09/02/2017   Herpes zoster    Panic attacks    PCOS (polycystic ovarian syndrome)    conceived on metformin and clomid   PP SVD 12/02/11 M 12/03/2011    No Known Allergies   Review of Systems:  No headache, visual changes, nausea, vomiting, diarrhea, constipation, dizziness, abdominal pain, skin rash, fevers, chills, night sweats, weight loss, swollen lymph nodes, body aches, joint swelling, chest pain, shortness of breath, mood changes. POSITIVE muscle aches  Objective  Blood pressure 118/68, pulse (!) 105, height 5\' 6"  (1.676 m), weight 143 lb (64.9 kg), SpO2 99 %.   General: No  apparent distress alert and oriented x3 mood and affect normal, dressed appropriately.  HEENT: Pupils equal, extraocular movements intact  Respiratory: Patient's speak in full sentences and does not appear short of breath  Low back exam does have some mild loss of lordosis.  Continues to have hypermobility noted.  Patient does have some icing regimen.  Osteopathic findings  C2 flexed rotated and side bent right C6 flexed rotated and side bent left T3 extended rotated and side bent right inhaled rib T9 extended rotated and side bent left L2 flexed rotated and side bent right Sacrum right on right       Assessment and Plan:  Hypermobility syndrome Hypermobility noted.  Discussed icing regimen and home exercises.  Discussed which activities to do and which ones to avoid.  With return of to see me again in 6 to 12 weeks    Nonallopathic problems  Decision today to treat with OMT was based on Physical Exam  After verbal consent patient was treated with HVLA, ME, FPR techniques in cervical, rib, thoracic, lumbar, and sacral  areas  Patient tolerated the procedure well with improvement in symptoms  Patient given exercises, stretches and lifestyle modifications  See medications in patient instructions if given  Patient will follow up in 4-8 weeks      The above documentation has been reviewed and is accurate and complete , DO        Note: This dictation was prepared with Dragon dictation along with smaller phrase technology. Any transcriptional errors that result from this process  are unintentional.

## 2021-11-06 ENCOUNTER — Ambulatory Visit (INDEPENDENT_AMBULATORY_CARE_PROVIDER_SITE_OTHER): Payer: PRIVATE HEALTH INSURANCE | Admitting: Family Medicine

## 2021-11-06 ENCOUNTER — Other Ambulatory Visit: Payer: Self-pay

## 2021-11-06 VITALS — BP 118/68 | HR 105 | Ht 66.0 in | Wt 143.0 lb

## 2021-11-06 DIAGNOSIS — M9908 Segmental and somatic dysfunction of rib cage: Secondary | ICD-10-CM | POA: Diagnosis not present

## 2021-11-06 DIAGNOSIS — M9902 Segmental and somatic dysfunction of thoracic region: Secondary | ICD-10-CM

## 2021-11-06 DIAGNOSIS — M357 Hypermobility syndrome: Secondary | ICD-10-CM | POA: Diagnosis not present

## 2021-11-06 DIAGNOSIS — M9901 Segmental and somatic dysfunction of cervical region: Secondary | ICD-10-CM

## 2021-11-06 DIAGNOSIS — M9903 Segmental and somatic dysfunction of lumbar region: Secondary | ICD-10-CM

## 2021-11-06 DIAGNOSIS — M9904 Segmental and somatic dysfunction of sacral region: Secondary | ICD-10-CM

## 2021-11-06 NOTE — Assessment & Plan Note (Signed)
Hypermobility noted.  Discussed icing regimen and home exercises.  Discussed which activities to do and which ones to avoid.  With return of to see me again in 6 to 12 weeks

## 2021-11-06 NOTE — Patient Instructions (Signed)
Good to see you! Thanks for waiting for me See you again in 7-8 weeks

## 2021-12-02 ENCOUNTER — Other Ambulatory Visit: Payer: Self-pay | Admitting: Family Medicine

## 2021-12-24 NOTE — Progress Notes (Signed)
[name] [address] [place] [address] Phone: [phone] Subjective:   [name], am serving as a scribe for Dr. Antoine Primas.  This visit occurred during the SARS-CoV-2 public health emergency.  Safety protocols were in place, including screening questions prior to the visit, additional usage of staff PPE, and extensive cleaning of exam room while observing appropriate contact time as indicated for disinfecting solutions.   I'm seeing this patient by the request  of:  Everrett Coombe, DO  CC: Neck and back pain follow-up  [id number]  DEMETRESS TIFT is a 41 y.o. female coming in with complaint of back and neck pain. OMT on 11/06/2021. Patient states that her back is doing ok. Two weeks ago she developed some pain in [facility] joint and distal clavicle. Also notes weakness in tricep with tingling on surface of skin.   Medications patient has been prescribed: Effexor  Taking:         Reviewed prior external information including notes and imaging from previsou exam, outside providers and external EMR if available.   As well as notes that were available from care everywhere and other healthcare systems.  Past medical history, social, surgical and family history all reviewed in electronic medical record.  No pertanent information unless stated regarding to the chief complaint.   Past Medical History:  Diagnosis Date   Abnormal Pap smear    repeat ok   Fibromyalgia 09/02/2017   Herpes zoster    Panic attacks    PCOS (polycystic ovarian syndrome)    conceived on metformin and clomid   PP SVD 12/02/11 M 12/03/2011    No Known Allergies   Review of Systems:  No headache, visual changes, nausea, vomiting, diarrhea, constipation, dizziness, abdominal pain, skin rash, fevers, chills, night sweats, weight loss, swollen lymph nodes, body aches, joint swelling, chest pain, shortness of breath, mood changes. POSITIVE muscle aches  Objective   Blood pressure 110/80, pulse (!) 104, height 5\' 6"  (1.676 m), weight 147 lb (66.7 kg), SpO2 98 %.   General: No apparent distress alert and oriented x3 mood and affect normal, dressed appropriately.  HEENT: Pupils equal, extraocular movements intact  Respiratory: Patient's speak in full sentences and does not appear short of breath  Cardiovascular: No lower extremity edema, non tender, no erythema  Neuro: Cranial nerves II through XII are intact, neurovascularly intact in all extremities with 2+ DTRs and 2+ pulses.  Gait normal with good balance and coordination.  Hypermobility noted.  The patient does have some mild loss of lordosis of the neck. Patient continues to have tightness mostly around the right sacroiliac joint.  Seems to be more than what would be anticipated today.  Patient also has significant tightness noted in the right sacroiliac joint.  Mild positive FABER test noted.  Osteopathic findings  C2 flexed rotated and side bent right C6 flexed rotated and side bent left T3 extended rotated and side bent right inhaled rib T9 extended rotated and side bent left L2 flexed rotated and side bent right Sacrum right on right       Assessment and Plan:  Hypermobility syndrome Continued hypermobility continued hypermobility noted.  Patient is doing relatively well with conservative therapy.  Patient now area does take the Effexor which is helped her with more of the chronic pain and fibromyalgia.  No significant side effects.  Do not think that increasing would be beneficial for this individual but has made significant progress over time.  Follow-up with me again in 6 weeks    Nonallopathic problems  Decision today to treat with OMT was based on Physical Exam  After verbal consent patient was treated with HVLA, ME, FPR techniques in cervical, rib, thoracic, lumbar, and sacral  areas  Patient tolerated the procedure well with improvement in symptoms  Patient given exercises,  stretches and lifestyle modifications  See medications in patient instructions if given  Patient will follow up in 4-8 weeks     The above documentation has been reviewed and is accurate and complete Judi Saa, DO        Note: This dictation was prepared with Dragon dictation along with smaller phrase technology. Any transcriptional errors that result from this process are unintentional.

## 2021-12-25 ENCOUNTER — Other Ambulatory Visit: Payer: Self-pay

## 2021-12-25 ENCOUNTER — Ambulatory Visit (INDEPENDENT_AMBULATORY_CARE_PROVIDER_SITE_OTHER): Payer: PRIVATE HEALTH INSURANCE | Admitting: Family Medicine

## 2021-12-25 VITALS — BP 110/80 | HR 104 | Ht 66.0 in | Wt 147.0 lb

## 2021-12-25 DIAGNOSIS — M9901 Segmental and somatic dysfunction of cervical region: Secondary | ICD-10-CM | POA: Diagnosis not present

## 2021-12-25 DIAGNOSIS — M9908 Segmental and somatic dysfunction of rib cage: Secondary | ICD-10-CM

## 2021-12-25 DIAGNOSIS — M357 Hypermobility syndrome: Secondary | ICD-10-CM | POA: Diagnosis not present

## 2021-12-25 DIAGNOSIS — M9903 Segmental and somatic dysfunction of lumbar region: Secondary | ICD-10-CM | POA: Diagnosis not present

## 2021-12-25 DIAGNOSIS — M9902 Segmental and somatic dysfunction of thoracic region: Secondary | ICD-10-CM

## 2021-12-25 DIAGNOSIS — M9904 Segmental and somatic dysfunction of sacral region: Secondary | ICD-10-CM | POA: Diagnosis not present

## 2021-12-25 MED ORDER — VENLAFAXINE HCL ER 150 MG PO CP24: 150.0000 mg | ORAL_CAPSULE | Freq: Every day | ORAL | 3 refills | Status: DC

## 2021-12-25 NOTE — Patient Instructions (Addendum)
Refilled Effexor  Thanks for stories Watch where you put your elbow See me in 6-8 weeks

## 2021-12-27 NOTE — Assessment & Plan Note (Signed)
Continued hypermobility continued hypermobility noted.  Patient is doing relatively well with conservative therapy.  Patient now area does take the Effexor which is helped her with more of the chronic pain and fibromyalgia.  No significant side effects.  Do not think that increasing would be beneficial for this individual but has made significant progress over time.  Follow-up with me again in 6 weeks

## 2022-02-04 NOTE — Progress Notes (Signed)
?Charlann Boxer D.O. ?[facility] ?[place] ?Phone: [phone] ?Subjective:   ?I, Jacqualin Combes, am serving as a scribe for Dr. Hulan Saas. ? ?This visit occurred during the SARS-CoV-2 public health emergency.  Safety protocols were in place, including screening questions prior to the visit, additional usage of staff PPE, and extensive cleaning of exam room while observing appropriate contact time as indicated for disinfecting solutions.  ? ? ?I'm seeing this patient by the request  of:  Luetta Nutting, DO ? ?CC: Back and neck pain follow-up ? ?[id number]  ?Angela [name] is a 41 y.o. female coming in with complaint of back and neck pain. OMT 12/25/2021. Patient states that she is tight.  No significant symptoms. ? ?Medications patient has been prescribed: None ? ?Taking: ? ? ?  ? ? ? ? ?Reviewed prior external information including notes and imaging from previsou exam, outside providers and external EMR if available.  ? ?As well as notes that were available from care everywhere and other healthcare systems. ? ?Past medical history, social, surgical and family history all reviewed in electronic medical record.  No pertanent information unless stated regarding to the chief complaint.  ? ?Past Medical History:  ?Diagnosis Date  ? Abnormal Pap smear   ? repeat ok  ? Fibromyalgia 09/02/2017  ? Herpes zoster   ? Panic attacks   ? PCOS (polycystic ovarian syndrome)   ? conceived on metformin and clomid  ? PP SVD 12/02/11 M 12/03/2011  ?  ?No Known Allergies ? ? ?Review of Systems: ? No headache, visual changes, nausea, vomiting, diarrhea, constipation, dizziness, abdominal pain, skin rash, fevers, chills, night sweats, weight loss, swollen lymph nodes, body aches, joint swelling, chest pain, shortness of breath, mood changes. POSITIVE muscle aches ? ?Objective  ?Blood pressure 112/82, pulse 98, height 5\' 6"  (1.676 m), weight 145 lb (65.8 kg), SpO2 99 %. ?  ?General: No apparent  distress alert and oriented x3 mood and affect normal, dressed appropriately.  ?HEENT: Pupils equal, extraocular movements intact  ?Respiratory: Patient's speak in full sentences and does not appear short of breath  ?Cardiovascular: No lower extremity edema, non tender, no erythema  ?Neck exam does have some loss of lordosis.  Some tenderness to palpation of the paraspinal musculature.  Patient does have hypermobility noted.  Some tightness noted in the right paraspinal musculature as well. ? ?Osteopathic findings ? ?C2 flexed rotated and side bent right ?C7 flexed rotated and side bent right  ?T3 extended rotated and side bent right inhaled rib ?T9 extended rotated and side bent left ?L1 flexed rotated and side bent right ?Sacrum right on right ? ? ? ? ?  ?Assessment and Plan: ? ?Hypermobility syndrome ?Hypermobility noted.  Discussed posture and ergonomics, discussed which activities to do and which ones to avoid, increase activity slowly.  Follow-up with me again in 6 to 8 weeks. ?  ? ?Nonallopathic problems ? ?Decision today to treat with OMT was based on Physical Exam ? ?After verbal consent patient was treated with HVLA, ME, FPR techniques in cervical, rib, thoracic, lumbar, and sacral  areas ? ?Patient tolerated the procedure well with improvement in symptoms ? ?Patient given exercises, stretches and lifestyle modifications ? ?See medications in patient instructions if given ? ?Patient will follow up in 4-8 weeks ? ?  ? ? ?The above documentation has been reviewed and is accurate and complete Lyndal Pulley, DO ? ? ? ?  ? ? Note: This  dictation was prepared with Dragon dictation along with smaller phrase technology. Any transcriptional errors that result from this process are unintentional.    ?  ?  ? ?

## 2022-02-08 ENCOUNTER — Encounter: Payer: Self-pay | Admitting: Family Medicine

## 2022-02-08 ENCOUNTER — Ambulatory Visit (INDEPENDENT_AMBULATORY_CARE_PROVIDER_SITE_OTHER): Payer: PRIVATE HEALTH INSURANCE | Admitting: Family Medicine

## 2022-02-08 VITALS — BP 112/82 | HR 98 | Ht 66.0 in | Wt 145.0 lb

## 2022-02-08 DIAGNOSIS — M9902 Segmental and somatic dysfunction of thoracic region: Secondary | ICD-10-CM | POA: Diagnosis not present

## 2022-02-08 DIAGNOSIS — M9903 Segmental and somatic dysfunction of lumbar region: Secondary | ICD-10-CM | POA: Diagnosis not present

## 2022-02-08 DIAGNOSIS — M9904 Segmental and somatic dysfunction of sacral region: Secondary | ICD-10-CM

## 2022-02-08 DIAGNOSIS — M357 Hypermobility syndrome: Secondary | ICD-10-CM

## 2022-02-08 DIAGNOSIS — M9901 Segmental and somatic dysfunction of cervical region: Secondary | ICD-10-CM | POA: Diagnosis not present

## 2022-02-08 DIAGNOSIS — M9908 Segmental and somatic dysfunction of rib cage: Secondary | ICD-10-CM | POA: Diagnosis not present

## 2022-02-08 NOTE — Patient Instructions (Signed)
You are amazing  ?Good luck with the closet and enjoy the movie ?Keep up everything else ?See me again in 6-8 weeks! ?

## 2022-02-08 NOTE — Assessment & Plan Note (Signed)
Hypermobility noted.  Discussed posture and ergonomics, discussed which activities to do and which ones to avoid, increase activity slowly.  Follow-up with me again in 6 to 8 weeks. ?

## 2022-03-08 LAB — HM PAP SMEAR: HM Pap smear: NEGATIVE

## 2022-03-08 LAB — HM MAMMOGRAPHY

## 2022-03-17 ENCOUNTER — Ambulatory Visit (INDEPENDENT_AMBULATORY_CARE_PROVIDER_SITE_OTHER): Payer: PRIVATE HEALTH INSURANCE | Admitting: Family Medicine

## 2022-03-17 ENCOUNTER — Encounter: Payer: Self-pay | Admitting: Family Medicine

## 2022-03-17 VITALS — BP 124/81 | HR 108 | Ht 66.0 in | Wt 138.0 lb

## 2022-03-17 DIAGNOSIS — M359 Systemic involvement of connective tissue, unspecified: Secondary | ICD-10-CM | POA: Insufficient documentation

## 2022-03-17 DIAGNOSIS — F419 Anxiety disorder, unspecified: Secondary | ICD-10-CM

## 2022-03-17 DIAGNOSIS — Z0000 Encounter for general adult medical examination without abnormal findings: Secondary | ICD-10-CM

## 2022-03-17 DIAGNOSIS — E782 Mixed hyperlipidemia: Secondary | ICD-10-CM | POA: Diagnosis not present

## 2022-03-17 DIAGNOSIS — F32A Depression, unspecified: Secondary | ICD-10-CM

## 2022-03-17 MED ORDER — ALPRAZOLAM 0.5 MG PO TABS: 0.5000 mg | ORAL_TABLET | Freq: Every day | ORAL | 1 refills | Status: DC | PRN

## 2022-03-17 NOTE — Progress Notes (Signed)
?Angela [name] - 41 y.o. female MRN [id number]  Date of birth: April 10, 1981 ? ?Subjective ?Chief Complaint  ?Patient presents with  ? Annual Exam  ? ? ?HPI ?Angela [name] is a 41 y.o. female here today for annual exam.  She reports that she is  doing well.  She continues to see Dr. Katrinka Blazing regularly for OMT therapy related to her hypermobility.   ? ?She is a non-smoker.  She consumes EtOH occasionally.  ? ?She tries to stay pretty active and feels that her diet is pretty healthy.  ? ?She is requesting renewal of alprazolam.  She uses this sparingly.   ? ?Review of Systems  ?Constitutional:  Negative for chills, fever, malaise/fatigue and weight loss.  ?HENT:  Negative for congestion, ear pain and sore throat.   ?Eyes:  Negative for blurred vision, double vision and pain.  ?Respiratory:  Negative for cough and shortness of breath.   ?Cardiovascular:  Negative for chest pain and palpitations.  ?Gastrointestinal:  Negative for abdominal pain, blood in stool, constipation, heartburn and nausea.  ?Genitourinary:  Negative for dysuria and urgency.  ?Musculoskeletal:  Negative for joint pain and myalgias.  ?Neurological:  Negative for dizziness and headaches.  ?Endo/Heme/Allergies:  Does not bruise/bleed easily.  ?Psychiatric/Behavioral:  Negative for depression. The patient is not nervous/anxious and does not have insomnia.   ? ?No Known Allergies ? ?Past Medical History:  ?Diagnosis Date  ? Abnormal Pap smear   ? repeat ok  ? Fibromyalgia 09/02/2017  ? Herpes zoster   ? Panic attacks   ? PCOS (polycystic ovarian syndrome)   ? conceived on metformin and clomid  ? PP SVD 12/02/11 M 12/03/2011  ? ? ?Past Surgical History:  ?Procedure Laterality Date  ? BREAST BIOPSY Right   ? Benign  ? WISDOM TOOTH EXTRACTION    ? ? ?Social History  ? ?Socioeconomic History  ? Marital status: Married  ?  Spouse name: Not on file  ? Number of children: 1  ? Years of education: 39  ? Highest education level: Not on file  ?Occupational History  ?  Occupation: Radiation Therapist  ?Tobacco Use  ? Smoking status: Former  ?  Types: Cigarettes  ?  Quit date: 11/01/2009  ?  Years since quitting: 12.3  ? Smokeless tobacco: Never  ?Vaping Use  ? Vaping Use: Never used  ?Substance and Sexual Activity  ? Alcohol use: Yes  ?  Alcohol/week: 2.0 standard drinks  ?  Types: 2 Cans of beer per week  ? Drug use: No  ? Sexual activity: Yes  ?  Partners: Male  ?  Birth control/protection: I.U.D.  ?Other Topics Concern  ? Not on file  ?Social History Narrative  ? Works as a Systems developer; Fun: [name] out with family.   ? Denies any religious beliefs effecting health care.   ? Lives with husband and son in a 2 story home.    ? Education: college.   ? ?Social Determinants of Health  ? ?Financial Resource Strain: Not on file  ?Food Insecurity: Not on file  ?Transportation Needs: Not on file  ?Physical Activity: Not on file  ?Stress: Not on file  ?Social Connections: Not on file  ? ? ?Family History  ?Problem Relation Age of Onset  ? Diabetes Father   ?     uncontrolled  ? Hypertension Father   ? Hyperlipidemia Father   ? Alcohol abuse Other   ? Arthritis Other   ? Ovarian cancer Paternal  Grandmother   ? Pancreatic cancer Cousin   ?     paternal 2nd cousin  ? Anesthesia problems Neg Hx   ? ? ?Health Maintenance  ?Topic Date Due  ? Hepatitis C Screening  Never done  ? COVID-19 Vaccine (3 - Booster for Pfizer series) 01/31/2020  ? INFLUENZA VACCINE  06/01/2022  ? PAP SMEAR-Modifier  12/31/2022  ? TETANUS/TDAP  08/18/2026  ? HIV Screening  Completed  ? HPV VACCINES  Aged Out  ? ? ? ?----------------------------------------------------------------------------------------------------------------------------------------------------------------------------------------------------------------- ?Physical Exam ?BP 124/81 (BP Location: Left Arm, Patient Position: Sitting, Cuff Size: Small)   Pulse (!) 108   Ht 5\' 6"  (1.676 m)   Wt 138 lb (62.6 kg)   SpO2 100%   BMI 22.27 kg/m?   ? ?Physical Exam ?Constitutional:   ?   General: She is not in acute distress. ?HENT:  ?   Head: Normocephalic and atraumatic.  ?   Right Ear: Tympanic membrane and ear canal normal.  ?   Left Ear: Tympanic membrane and ear canal normal.  ?   Nose: Nose normal.  ?Eyes:  ?   General: No scleral icterus. ?   Conjunctiva/sclera: Conjunctivae normal.  ?Neck:  ?   Thyroid: No thyromegaly.  ?Cardiovascular:  ?   Rate and Rhythm: Normal rate and regular rhythm.  ?   Heart sounds: Normal heart sounds.  ?Pulmonary:  ?   Effort: Pulmonary effort is normal.  ?   Breath sounds: Normal breath sounds.  ?Abdominal:  ?   General: Bowel sounds are normal. There is no distension.  ?   Palpations: Abdomen is soft.  ?   Tenderness: There is no abdominal tenderness. There is no guarding.  ?Musculoskeletal:     ?   General: Normal range of motion.  ?   Cervical back: Normal range of motion and neck supple.  ?Lymphadenopathy:  ?   Cervical: No cervical adenopathy.  ?Skin: ?   General: Skin is warm and dry.  ?   Findings: No rash.  ?Neurological:  ?   General: No focal deficit present.  ?   Mental Status: She is alert and oriented to person, place, and time.  ?   Cranial Nerves: No cranial nerve deficit.  ?   Coordination: Coordination normal.  ?Psychiatric:     ?   Mood and Affect: Mood normal.     ?   Behavior: Behavior normal.  ? ? ?------------------------------------------------------------------------------------------------------------------------------------------------------------------------------------------------------------------- ?Assessment and Plan ? ?Well adult exam ?Well adult ?Orders Placed This Encounter  ?Procedures  ? COMPLETE METABOLIC PANEL WITH GFR  ? CBC with Differential  ? Lipid Panel w/reflex Direct LDL  ?Screenings: Pap and mammogram completed at GYN, records requested.  Additional screenings per lab orders.  ?Immunizations: UTD ?Anticipatory guidance/Risk factor reduction:  Recommendations per AVS.   ? ? ?No  orders of the defined types were placed in this encounter. ? ? ?No follow-ups on file. ? ? ? ?This visit occurred during the SARS-CoV-2 public health emergency.  Safety protocols were in place, including screening questions prior to the visit, additional usage of staff PPE, and extensive cleaning of exam room while observing appropriate contact time as indicated for disinfecting solutions.  ? ?

## 2022-03-17 NOTE — Patient Instructions (Signed)

## 2022-03-17 NOTE — Assessment & Plan Note (Signed)
Well adult ?Orders Placed This Encounter  ?Procedures  ?? COMPLETE METABOLIC PANEL WITH GFR  ?? CBC with Differential  ?? Lipid Panel w/reflex Direct LDL  ?Screenings: Pap and mammogram completed at GYN, records requested.  Additional screenings per lab orders.  ?Immunizations: UTD ?Anticipatory guidance/Risk factor reduction:  Recommendations per AVS.   ?

## 2022-03-18 LAB — COMPLETE METABOLIC PANEL WITH GFR
AG Ratio: 2 (calc) (ref 1.0–2.5)
ALT: 17 U/L (ref 6–29)
AST: 15 U/L (ref 10–30)
Albumin: 4.9 g/dL (ref 3.6–5.1)
Alkaline phosphatase (APISO): 56 U/L (ref 31–125)
BUN: 15 mg/dL (ref 7–25)
CO2: 23 mmol/L (ref 20–32)
Calcium: 9.6 mg/dL (ref 8.6–10.2)
Chloride: 107 mmol/L (ref 98–110)
Creat: 0.69 mg/dL (ref 0.50–0.99)
Globulin: 2.5 g/dL (calc) (ref 1.9–3.7)
Glucose, Bld: 96 mg/dL (ref 65–99)
Potassium: 4.5 mmol/L (ref 3.5–5.3)
Sodium: 141 mmol/L (ref 135–146)
Total Bilirubin: 0.5 mg/dL (ref 0.2–1.2)
Total Protein: 7.4 g/dL (ref 6.1–8.1)
eGFR: 112 mL/min/{1.73_m2} (ref >=60)

## 2022-03-18 LAB — CBC WITH DIFFERENTIAL/PLATELET
Absolute Monocytes: 382 cells/uL (ref 200–950)
Basophils Absolute: 43 cells/uL (ref 0–200)
Basophils Relative: 0.6 %
Eosinophils Absolute: 58 cells/uL (ref 15–500)
Eosinophils Relative: 0.8 %
HCT: 41.1 % (ref 35.0–45.0)
Hemoglobin: 13.3 g/dL (ref 11.7–15.5)
Lymphs Abs: 2189 cells/uL (ref 850–3900)
MCH: 28.9 pg (ref 27.0–33.0)
MCHC: 32.4 g/dL (ref 32.0–36.0)
MCV: 89.3 fL (ref 80.0–100.0)
MPV: 9.5 fL (ref 7.5–12.5)
Monocytes Relative: 5.3 %
Neutro Abs: 4529 cells/uL (ref 1500–7800)
Neutrophils Relative %: 62.9 %
Platelets: 368 10*3/uL (ref 140–400)
RBC: 4.6 10*6/uL (ref 3.80–5.10)
RDW: 12.5 % (ref 11.0–15.0)
Total Lymphocyte: 30.4 %
WBC: 7.2 10*3/uL (ref 3.8–10.8)

## 2022-03-18 LAB — LIPID PANEL W/REFLEX DIRECT LDL
Cholesterol: 214 mg/dL — ABNORMAL HIGH (ref <200)
HDL: 65 mg/dL (ref >=50)
LDL Cholesterol (Calc): 131 mg/dL (calc) — ABNORMAL HIGH
Non-HDL Cholesterol (Calc): 149 mg/dL (calc) — ABNORMAL HIGH (ref <130)
Total CHOL/HDL Ratio: 3.3 (calc) (ref <5.0)
Triglycerides: 83 mg/dL (ref <150)

## 2022-03-19 ENCOUNTER — Encounter: Payer: Self-pay | Admitting: Family Medicine

## 2022-03-25 NOTE — Progress Notes (Deleted)
  [name] [address] [place] [address] Phone: [phone] Subjective:    I'm seeing this patient by the request  of:  Everrett Coombe, DO  CC:   [id number]  Angela [name] is a 41 y.o. female coming in with complaint of back and neck pain. OMT on 02/08/2022. Patient states   Medications patient has been prescribed: Effexor  Taking:         Reviewed prior external information including notes and imaging from previsou exam, outside providers and external EMR if available.   As well as notes that were available from care everywhere and other healthcare systems.  Past medical history, social, surgical and family history all reviewed in electronic medical record.  No pertanent information unless stated regarding to the chief complaint.   Past Medical History:  Diagnosis Date   Abnormal Pap smear    repeat ok   Fibromyalgia 09/02/2017   Herpes zoster    Panic attacks    PCOS (polycystic ovarian syndrome)    conceived on metformin and clomid   PP SVD 12/02/11 M 12/03/2011    No Known Allergies   Review of Systems:  No headache, visual changes, nausea, vomiting, diarrhea, constipation, dizziness, abdominal pain, skin rash, fevers, chills, night sweats, weight loss, swollen lymph nodes, body aches, joint swelling, chest pain, shortness of breath, mood changes. POSITIVE muscle aches  Objective  There were no vitals taken for this visit.   General: No apparent distress alert and oriented x3 mood and affect normal, dressed appropriately.  HEENT: Pupils equal, extraocular movements intact  Respiratory: Patient's speak in full sentences and does not appear short of breath  Cardiovascular: No lower extremity edema, non tender, no erythema  Neuro: Cranial nerves II through XII are intact, neurovascularly intact in all extremities with 2+ DTRs and 2+ pulses.  Gait normal with good balance and coordination.  MSK:  Non tender with full  range of motion and good stability and symmetric strength and tone of shoulders, elbows, wrist, hip, knee and ankles bilaterally.  Back - Normal skin, Spine with normal alignment and no deformity.  No tenderness to vertebral process palpation.  Paraspinous muscles are not tender and without spasm.   Range of motion is full at neck and lumbar sacral regions  Osteopathic findings  C2 flexed rotated and side bent right C6 flexed rotated and side bent left T3 extended rotated and side bent right inhaled rib T9 extended rotated and side bent left L2 flexed rotated and side bent right Sacrum right on right       Assessment and Plan:    Nonallopathic problems  Decision today to treat with OMT was based on Physical Exam  After verbal consent patient was treated with HVLA, ME, FPR techniques in cervical, rib, thoracic, lumbar, and sacral  areas  Patient tolerated the procedure well with improvement in symptoms  Patient given exercises, stretches and lifestyle modifications  See medications in patient instructions if given  Patient will follow up in 4-8 weeks      The above documentation has been reviewed and is accurate and complete Judi Saa, DO       Note: This dictation was prepared with Dragon dictation along with smaller phrase technology. Any transcriptional errors that result from this process are unintentional.

## 2022-03-30 ENCOUNTER — Ambulatory Visit: Payer: PRIVATE HEALTH INSURANCE | Admitting: Family Medicine

## 2022-03-31 ENCOUNTER — Encounter: Payer: Self-pay | Admitting: Family Medicine

## 2022-04-01 ENCOUNTER — Other Ambulatory Visit: Payer: Self-pay | Admitting: Family Medicine

## 2022-04-01 NOTE — Progress Notes (Unsigned)
[name] [address] [place] [address] Phone: [phone] Subjective:   [name], am serving as a scribe for Dr. Antoine Primas.   I'm seeing this patient by the request  of:  Everrett Coombe, DO  CC: Neck and back pain follow-up  [id number]  Angela [name] is a 41 y.o. female coming in with complaint of back and neck pain. OMT 02/08/2022. Patient states that she is having pain in thoracic spine T8 and T10.  Nothing new specifically just worsening of previous pain.  Medications patient has been prescribed: Effexor  Taking: Yes         Reviewed prior external information including notes and imaging from previsou exam, outside providers and external EMR if available.   As well as notes that were available from care everywhere and other healthcare systems.  Past medical history, social, surgical and family history all reviewed in electronic medical record.  No pertanent information unless stated regarding to the chief complaint.   Past Medical History:  Diagnosis Date   Abnormal Pap smear    repeat ok   Fibromyalgia 09/02/2017   Herpes zoster    Panic attacks    PCOS (polycystic ovarian syndrome)    conceived on metformin and clomid   PP SVD 12/02/11 M 12/03/2011    No Known Allergies   Review of Systems:  No headache, visual changes, nausea, vomiting, diarrhea, constipation, dizziness, abdominal pain, skin rash, fevers, chills, night sweats, weight loss, swollen lymph nodes, body aches, joint swelling, chest pain, shortness of breath, mood changes. POSITIVE muscle aches  Objective  Blood pressure 110/84, pulse (!) 106, height 5\' 6"  (1.676 m), weight 136 lb (61.7 kg), SpO2 96 %.   General: No apparent distress alert and oriented x3 mood and affect normal, dressed appropriately.  HEENT: Pupils equal, extraocular movements intact  Respiratory: Patient's speak in full sentences and does not appear short of breath   Cardiovascular: No lower extremity edema, non tender, no erythema  Significant increase in tightness noted of the paraspinal musculature mostly at the thoracolumbar juncture left greater than right.  Seems to go up into the thoracic area.  No CVA tenderness noted.  No midline tenderness  Osteopathic findings  C2 flexed rotated and side bent right C6 flexed rotated and side bent left T3 extended rotated and side bent right inhaled rib T9 extended rotated and side bent left L1 flexed rotated and side bent left  Sacrum right on right       Assessment and Plan:  Connective tissue disease (HCC) Hypermobility  Chronic, with mild exacerbation.  Has responded relatively well to osteopathic manipulation.  Discussed which activities to do more things to avoid.Follow-up with me again in 6 to 8 weeks   Nonallopathic problems  Decision today to treat with OMT was based on Physical Exam  After verbal consent patient was treated with HVLA, ME, FPR techniques in cervical, rib, thoracic, lumbar, and sacral  areas  Patient tolerated the procedure well with improvement in symptoms  Patient given exercises, stretches and lifestyle modifications  See medications in patient instructions if given  Patient will follow up in 4-8 weeks      The above documentation has been reviewed and is accurate and complete , DO        Note: This dictation was prepared with Dragon dictation along with smaller phrase technology. Any transcriptional errors that result from this process are unintentional.

## 2022-04-02 ENCOUNTER — Encounter: Payer: Self-pay | Admitting: Family Medicine

## 2022-04-02 ENCOUNTER — Ambulatory Visit (INDEPENDENT_AMBULATORY_CARE_PROVIDER_SITE_OTHER): Payer: PRIVATE HEALTH INSURANCE | Admitting: Family Medicine

## 2022-04-02 VITALS — BP 110/84 | HR 106 | Ht 66.0 in | Wt 136.0 lb

## 2022-04-02 DIAGNOSIS — M9903 Segmental and somatic dysfunction of lumbar region: Secondary | ICD-10-CM

## 2022-04-02 DIAGNOSIS — M9901 Segmental and somatic dysfunction of cervical region: Secondary | ICD-10-CM

## 2022-04-02 DIAGNOSIS — M9908 Segmental and somatic dysfunction of rib cage: Secondary | ICD-10-CM

## 2022-04-02 DIAGNOSIS — M9902 Segmental and somatic dysfunction of thoracic region: Secondary | ICD-10-CM

## 2022-04-02 DIAGNOSIS — M359 Systemic involvement of connective tissue, unspecified: Secondary | ICD-10-CM | POA: Diagnosis not present

## 2022-04-02 DIAGNOSIS — M9904 Segmental and somatic dysfunction of sacral region: Secondary | ICD-10-CM

## 2022-04-02 NOTE — Assessment & Plan Note (Signed)
Hypermobility  Chronic, with mild exacerbation.  Has responded relatively well to osteopathic manipulation.  Discussed which activities to do more things to avoid.Follow-up with me again in 6 to 8 weeks

## 2022-04-02 NOTE — Patient Instructions (Signed)
Good to see you See me in 6 weeks  Make appt for after your [place] trip

## 2022-04-15 ENCOUNTER — Ambulatory Visit: Payer: PRIVATE HEALTH INSURANCE | Admitting: Family Medicine

## 2022-05-11 NOTE — Progress Notes (Unsigned)
  [name] [address] [place] [address] Phone: [phone] Subjective:   Angela [name], am serving as a scribe for Dr. Antoine Primas.  I'm seeing this patient by the request  of:  Everrett Coombe, DO  CC: Back and neck pain follow-up  [id number]  Angela [name] is a 41 y.o. female coming in with complaint of back and neck pain. OMT 04/02/2022. Patient states that she is tense in shoulder.  Patient states somewhat it seems to go into her neck as well.  Mild stress in life but nothing as severe as previously.  Medications patient has been prescribed: Effexor  Taking: Yes       Past Medical History:  Diagnosis Date   Abnormal Pap smear    repeat ok   Fibromyalgia 09/02/2017   Herpes zoster    Panic attacks    PCOS (polycystic ovarian syndrome)    conceived on metformin and clomid   PP SVD 12/02/11 M 12/03/2011    No Known Allergies   Review of Systems:  No headache, visual changes, nausea, vomiting, diarrhea, constipation, dizziness, abdominal pain, skin rash, fevers, chills, night sweats, weight loss, swollen lymph nodes,, joint swelling, chest pain, shortness of breath, mood changes. POSITIVE muscle aches, body aches  Objective  Blood pressure 110/82, height 5\' 6"  (1.676 m), weight 134 lb (60.8 kg).   General: No apparent distress alert and oriented x3 mood and affect normal, dressed appropriately.  HEENT: Pupils equal, extraocular movements intact  Respiratory: Patient's speak in full sentences and does not appear short of breath  Cardiovascular: No lower extremity edema, non tender, no erythema  Gait MSK:  Back does have hypermobility noted but does have some discomfort noted in the thoracolumbar juncture.  Tightness also noted in the parascapular region in the cervical thoracic area right greater than left.  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  Some mild stress at work.  Seems to be doing relatively well overall.  Patient does respond extremely well to osteopathic manipulation.  Effexor has helped with some of her chronic pain as well.  Discussed icing regimen and home exercises.  Follow-up again in 6 to 8 weeks    Nonallopathic problems  Decision today to treat with OMT was based on Physical Exam  After verbal consent patient was treated with HVLA, ME, FPR techniques in cervical, rib, thoracic, lumbar, and sacral  areas  Patient tolerated the procedure well with improvement in symptoms  Patient given exercises, stretches and lifestyle modifications  See medications in patient instructions if given  Patient will follow up in 4-8 weeks    The above documentation has been reviewed and is accurate and complete , DO          Note: This dictation was prepared with Dragon dictation along with smaller phrase technology. Any transcriptional errors that result from this process are unintentional.

## 2022-05-13 ENCOUNTER — Encounter: Payer: Self-pay | Admitting: Family Medicine

## 2022-05-13 ENCOUNTER — Other Ambulatory Visit (HOSPITAL_BASED_OUTPATIENT_CLINIC_OR_DEPARTMENT_OTHER): Payer: Self-pay

## 2022-05-13 ENCOUNTER — Ambulatory Visit (INDEPENDENT_AMBULATORY_CARE_PROVIDER_SITE_OTHER): Payer: PRIVATE HEALTH INSURANCE | Admitting: Family Medicine

## 2022-05-13 VITALS — BP 110/82 | Ht 66.0 in | Wt 134.0 lb

## 2022-05-13 DIAGNOSIS — M9901 Segmental and somatic dysfunction of cervical region: Secondary | ICD-10-CM | POA: Diagnosis not present

## 2022-05-13 DIAGNOSIS — M9902 Segmental and somatic dysfunction of thoracic region: Secondary | ICD-10-CM

## 2022-05-13 DIAGNOSIS — M9908 Segmental and somatic dysfunction of rib cage: Secondary | ICD-10-CM | POA: Diagnosis not present

## 2022-05-13 DIAGNOSIS — M9903 Segmental and somatic dysfunction of lumbar region: Secondary | ICD-10-CM | POA: Diagnosis not present

## 2022-05-13 DIAGNOSIS — M357 Hypermobility syndrome: Secondary | ICD-10-CM

## 2022-05-13 DIAGNOSIS — F32A Depression, unspecified: Secondary | ICD-10-CM

## 2022-05-13 DIAGNOSIS — M9904 Segmental and somatic dysfunction of sacral region: Secondary | ICD-10-CM

## 2022-05-13 MED ORDER — ALPRAZOLAM 0.5 MG PO TABS
0.5000 mg | ORAL_TABLET | Freq: Every day | ORAL | 1 refills | Status: DC | PRN
  Filled 2022-05-13: qty 20, 20d supply, fill #0

## 2022-05-13 NOTE — Telephone Encounter (Signed)
Last OV: 03/17/22 Next OV: 06/11/22 Last RF: 03/17/22

## 2022-05-13 NOTE — Assessment & Plan Note (Signed)
Continues to have hypermobility syndrome.  Some mild stress at work.  Seems to be doing relatively well overall.  Patient does respond extremely well to osteopathic manipulation.  Effexor has helped with some of her chronic pain as well.  Discussed icing regimen and home exercises.  Follow-up again in 6 to 8 weeks

## 2022-05-13 NOTE — Patient Instructions (Signed)
Have amazing time in [place] Can't wait to see pics See me when you get back

## 2022-05-14 ENCOUNTER — Other Ambulatory Visit (HOSPITAL_BASED_OUTPATIENT_CLINIC_OR_DEPARTMENT_OTHER): Payer: Self-pay

## 2022-06-09 NOTE — Progress Notes (Signed)
  [name] [address] [place] [address] Phone: [phone] Subjective:   [name], am serving as a scribe for Dr. Antoine Primas.  I'm seeing this patient by the request  of:  Everrett Coombe, DO  CC: Back and neck pain  [id number]  Angela [name] is a 41 y.o. female coming in with complaint of back and neck pain. OMT 05/13/2022. Patient states that she is sore after traveling and hiking.   Medications patient has been prescribed: Effexor  Taking:         Reviewed prior external information including notes and imaging from previsou exam, outside providers and external EMR if available.   As well as notes that were available from care everywhere and other healthcare systems.  Past medical history, social, surgical and family history all reviewed in electronic medical record.  No pertanent information unless stated regarding to the chief complaint.   Past Medical History:  Diagnosis Date   Abnormal Pap smear    repeat ok   Fibromyalgia 09/02/2017   Herpes zoster    Panic attacks    PCOS (polycystic ovarian syndrome)    conceived on metformin and clomid   PP SVD 12/02/11 M 12/03/2011    No Known Allergies   Review of Systems:  No headache, visual changes, nausea, vomiting, diarrhea, constipation, dizziness, abdominal pain, skin rash, fevers, chills, night sweats, weight loss, swollen lymph nodes, body aches, joint swelling, chest pain, shortness of breath, mood changes. POSITIVE muscle aches  Objective  Blood pressure 106/72, pulse 97, height 5\' 6"  (1.676 m), weight 137 lb (62.1 kg), SpO2 98 %.   General: No apparent distress alert and oriented x3 mood and affect normal, dressed appropriately.  HEENT: Pupils equal, extraocular movements intact  Respiratory: Patient's speak in full sentences and does not appear short of breath  Cardiovascular: No lower extremity edema, non tender, no erythema  Gait MSK:  Back patient  does have some loss of lordosis.  More tightness noted in the left parascapular region.  Osteopathic findings  C7 flexed rotated and side bent right T3 extended rotated and side bent left inhaled rib T6 extended rotated and side bent left inhaled. L1 flexed rotated and side bent right Sacrum right on right    Assessment and Plan:  Rib pain Tightness noted actually more at the T7 on the left side today.  Likely secondary to some of the patient's hiking.  Discussed icing regimen and home exercises.  Continue the Effexor at this moment.  Follow-up with me again in 6 to 8 weeks.    Nonallopathic problems  Decision today to treat with OMT was based on Physical Exam  After verbal consent patient was treated with HVLA, ME, FPR techniques in cervical, rib, thoracic, lumbar, and sacral  areas  Patient tolerated the procedure well with improvement in symptoms  Patient given exercises, stretches and lifestyle modifications  See medications in patient instructions if given  Patient will follow up in 4-8 weeks    The above documentation has been reviewed and is accurate and complete , DO          Note: This dictation was prepared with Dragon dictation along with smaller phrase technology. Any transcriptional errors that result from this process are unintentional.

## 2022-06-11 ENCOUNTER — Ambulatory Visit (INDEPENDENT_AMBULATORY_CARE_PROVIDER_SITE_OTHER): Payer: PRIVATE HEALTH INSURANCE | Admitting: Family Medicine

## 2022-06-11 ENCOUNTER — Encounter: Payer: Self-pay | Admitting: Family Medicine

## 2022-06-11 VITALS — BP 106/72 | HR 97 | Ht 66.0 in | Wt 137.0 lb

## 2022-06-11 DIAGNOSIS — R0781 Pleurodynia: Secondary | ICD-10-CM

## 2022-06-11 DIAGNOSIS — M9908 Segmental and somatic dysfunction of rib cage: Secondary | ICD-10-CM

## 2022-06-11 DIAGNOSIS — M9902 Segmental and somatic dysfunction of thoracic region: Secondary | ICD-10-CM | POA: Diagnosis not present

## 2022-06-11 DIAGNOSIS — M9901 Segmental and somatic dysfunction of cervical region: Secondary | ICD-10-CM

## 2022-06-11 DIAGNOSIS — M9904 Segmental and somatic dysfunction of sacral region: Secondary | ICD-10-CM

## 2022-06-11 DIAGNOSIS — M9903 Segmental and somatic dysfunction of lumbar region: Secondary | ICD-10-CM | POA: Diagnosis not present

## 2022-06-11 NOTE — Assessment & Plan Note (Signed)
Tightness noted actually more at the T7 on the left side today.  Likely secondary to some of the patient's hiking.  Discussed icing regimen and home exercises.  Continue the Effexor at this moment.  Follow-up with me again in 6 to 8 weeks.

## 2022-06-11 NOTE — Patient Instructions (Signed)
Thanks for starting my day off right 2-5mg  of melatonin at 6pm See me in 6-8 weeks

## 2022-06-25 ENCOUNTER — Encounter: Payer: Self-pay | Admitting: Family Medicine

## 2022-07-21 NOTE — Progress Notes (Signed)
  [place] Phone: [phone] Subjective:   [name], am serving as a scribe for Dr. Hulan Saas.  I'm seeing this patient by the request  of:  Luetta Nutting, DO  CC: Neck and back pain follow-up  [id number]  Angela [name] is a 41 y.o. female coming in with complaint of back and neck pain. OMT 06/11/2022. Patient states that she forgot to take Effexor yesterday and the following day she will feel dizzy and lightheaded. Did take it this morning.   Medications patient has been prescribed: Effexor  Taking: Yes but she notices worsening pain if she misses a         Past Medical History:  Diagnosis Date   Abnormal Pap smear    repeat ok   Fibromyalgia 09/02/2017   Herpes zoster    Panic attacks    PCOS (polycystic ovarian syndrome)    conceived on metformin and clomid   PP SVD 12/02/11 M 12/03/2011    No Known Allergies   Review of Systems:  No headache, visual changes, nausea, vomiting, diarrhea, constipation, dizziness, abdominal pain, skin rash, fevers, chills, night sweats, weight loss, swollen lymph nodes, body aches, joint swelling, chest pain, shortness of breath, mood changes. POSITIVE muscle aches  Objective  Blood pressure 104/78, pulse 90, height 5\' 6"  (1.676 m), weight 139 lb (63 kg), SpO2 97 %.   General: No apparent distress alert and oriented x3 mood and affect normal, dressed appropriately.  HEENT: Pupils equal, extraocular movements intact  Respiratory: Patient's speak in full sentences and does not appear short of breath  Cardiovascular: No lower extremity edema, non tender, no erythema  Gait MSK:  Back comfort noted more in the parascapular region in the thoracolumbar juncture today.  Tightness still noted around the sacroiliac joint.  Hypermobility still noted though.  Osteopathic findings  C2 flexed rotated and side bent right C6 flexed rotated and side bent  left T3 extended rotated and side bent right inhaled rib T7 extended rotated and side bent left L1 flexed rotated and side bent right Sacrum right on right       Assessment and Plan:  Hypermobility syndrome Continues with the hypermobility syndrome.  Discussed posture and ergonomics, which activities to do patient about the Effexor and it does seem to be helpful patient notices increasing discomfort as well.  Discussed continuing to stay active and follow-up with me again in 6 to 8 weeks    Nonallopathic problems  Decision today to treat with OMT was based on Physical Exam  After verbal consent patient was treated with HVLA, ME, FPR techniques in cervical, rib, thoracic, lumbar, and sacral  areas  Patient tolerated the procedure well with improvement in symptoms  Patient given exercises, stretches and lifestyle modifications  See medications in patient instructions if given  Patient will follow up in 4-8 weeks    The above documentation has been reviewed and is accurate and complete Lyndal Pulley, DO          Note: This dictation was prepared with Dragon dictation along with smaller phrase technology. Any transcriptional errors that result from this process are unintentional.

## 2022-07-23 ENCOUNTER — Ambulatory Visit (INDEPENDENT_AMBULATORY_CARE_PROVIDER_SITE_OTHER): Payer: PRIVATE HEALTH INSURANCE | Admitting: Family Medicine

## 2022-07-23 VITALS — BP 104/78 | HR 90 | Ht 66.0 in | Wt 139.0 lb

## 2022-07-23 DIAGNOSIS — M9903 Segmental and somatic dysfunction of lumbar region: Secondary | ICD-10-CM

## 2022-07-23 DIAGNOSIS — M357 Hypermobility syndrome: Secondary | ICD-10-CM

## 2022-07-23 DIAGNOSIS — M9904 Segmental and somatic dysfunction of sacral region: Secondary | ICD-10-CM | POA: Diagnosis not present

## 2022-07-23 DIAGNOSIS — M9902 Segmental and somatic dysfunction of thoracic region: Secondary | ICD-10-CM | POA: Diagnosis not present

## 2022-07-23 DIAGNOSIS — M9901 Segmental and somatic dysfunction of cervical region: Secondary | ICD-10-CM | POA: Diagnosis not present

## 2022-07-23 DIAGNOSIS — M9908 Segmental and somatic dysfunction of rib cage: Secondary | ICD-10-CM | POA: Diagnosis not present

## 2022-07-23 NOTE — Assessment & Plan Note (Signed)
Continues with the hypermobility syndrome.  Discussed posture and ergonomics, which activities to do patient about the Effexor and it does seem to be helpful patient notices increasing discomfort as well.  Discussed continuing to stay active and follow-up with me again in 6 to 8 weeks

## 2022-07-23 NOTE — Patient Instructions (Signed)
See me again in 6 weeks 

## 2022-07-31 ENCOUNTER — Other Ambulatory Visit: Payer: Self-pay | Admitting: Family Medicine

## 2022-08-02 ENCOUNTER — Other Ambulatory Visit (HOSPITAL_BASED_OUTPATIENT_CLINIC_OR_DEPARTMENT_OTHER): Payer: Self-pay

## 2022-08-02 ENCOUNTER — Encounter: Payer: Self-pay | Admitting: Family Medicine

## 2022-08-02 MED ORDER — VENLAFAXINE HCL ER 150 MG PO CP24
150.0000 mg | ORAL_CAPSULE | Freq: Every day | ORAL | 0 refills | Status: DC
  Filled 2022-08-02: qty 30, 30d supply, fill #0
  Filled 2022-08-27: qty 30, 30d supply, fill #1
  Filled 2022-09-29: qty 30, 30d supply, fill #2

## 2022-08-27 ENCOUNTER — Other Ambulatory Visit (HOSPITAL_BASED_OUTPATIENT_CLINIC_OR_DEPARTMENT_OTHER): Payer: Self-pay

## 2022-09-01 NOTE — Progress Notes (Signed)
[facility] [place] Phone: [phone] Subjective:   Angela [name], am serving as a scribe for Dr. Hulan [name].  I'm seeing this patient by the request  of:  Angela Nutting, DO  CC: Back and neck pain follow-up  [id number]  Angela [name] is a 41 y.o. female coming in with complaint of back and neck pain. OMT 07/23/2022. Patient states that she is experiencing her normal aches and pains.   Medications patient has been prescribed: Effexor  Taking:         Reviewed prior external information including notes and imaging from previsou exam, outside providers and external EMR if available.   As well as notes that were available from care everywhere and other healthcare systems.  Past medical history, social, surgical and family history all reviewed in electronic medical record.  [name] pertanent information unless stated regarding to the chief complaint.   Past Medical History:  Diagnosis Date   Abnormal Pap smear    repeat ok   Fibromyalgia 09/02/2017   Herpes zoster    Panic attacks    PCOS (polycystic ovarian syndrome)    conceived on metformin and clomid   PP SVD 12/02/11 M 12/03/2011    [name] Known Allergies   Review of Systems:  [name] headache, visual changes, nausea, vomiting, diarrhea, constipation, dizziness, abdominal pain, skin rash, fevers, chills, night sweats, weight loss, swollen lymph nodes, body aches, joint swelling, chest pain, shortness of breath, mood changes. POSITIVE muscle aches  Objective  Blood pressure 102/78, pulse 96, height 5\' 6"  (1.676 m), weight 142 lb (64.4 kg), SpO2 95 %.   General: [name] apparent distress alert and oriented x3 mood and affect normal, dressed appropriately.  HEENT: Pupils equal, extraocular movements intact  Respiratory: Patient's speak in full sentences and does not appear short of breath  Cardiovascular: [name] lower extremity edema, non tender, [name] erythema  Gait  normal gait MSK:  Back has more tightness than usual.  Patient's lower back is tender to palpation from the L1 area to the L5 area bilaterally.  Patient does have some mild tightness with FABER test this is different than patient's usual baseline.  Osteopathic findings  C2 flexed rotated and side bent right C6 flexed rotated and side bent left T3 extended rotated and side bent right inhaled rib T9 extended rotated and side bent left L2 flexed rotated and side bent right Sacrum right on right       Assessment and Plan:  Hypermobility syndrome Discussed which activities to be patient has not been worsening.  Hypermobility discussed with patient continuing to prioritize older.  [name] change in medications.  We discussed if necessary we can put some time with formal physical therapy.  Follow-up with me again in 6 to 8 weeks.    Nonallopathic problems  Decision today to treat with OMT was based on Physical Exam  After verbal consent patient was treated with HVLA, ME, FPR techniques in cervical, rib, thoracic, lumbar, and sacral  areas  Patient tolerated the procedure well with improvement in symptoms  Patient given exercises, stretches and lifestyle modifications  See medications in patient instructions if given  Patient will follow up in 4-8 weeks     The above documentation has been reviewed and is accurate and complete Angela Pulley, DO         Note: This dictation was prepared with Dragon dictation along with smaller phrase technology. Any transcriptional errors that result  from this process are unintentional.

## 2022-09-07 ENCOUNTER — Ambulatory Visit (INDEPENDENT_AMBULATORY_CARE_PROVIDER_SITE_OTHER): Payer: PRIVATE HEALTH INSURANCE | Admitting: Family Medicine

## 2022-09-07 VITALS — BP 102/78 | HR 96 | Ht 66.0 in | Wt 142.0 lb

## 2022-09-07 DIAGNOSIS — M9908 Segmental and somatic dysfunction of rib cage: Secondary | ICD-10-CM

## 2022-09-07 DIAGNOSIS — M9902 Segmental and somatic dysfunction of thoracic region: Secondary | ICD-10-CM

## 2022-09-07 DIAGNOSIS — M357 Hypermobility syndrome: Secondary | ICD-10-CM

## 2022-09-07 DIAGNOSIS — M9903 Segmental and somatic dysfunction of lumbar region: Secondary | ICD-10-CM

## 2022-09-07 DIAGNOSIS — M9901 Segmental and somatic dysfunction of cervical region: Secondary | ICD-10-CM

## 2022-09-07 DIAGNOSIS — M9904 Segmental and somatic dysfunction of sacral region: Secondary | ICD-10-CM

## 2022-09-07 NOTE — Assessment & Plan Note (Signed)
Discussed which activities to be patient has not been worsening.  Hypermobility discussed with patient continuing to prioritize older.  No change in medications.  We discussed if necessary we can put some time with formal physical therapy.  Follow-up with me again in 6 to 8 weeks.

## 2022-09-07 NOTE — Patient Instructions (Signed)
Thanks for being early bird Try to put yourself first See me in 6-7 weeks

## 2022-09-29 ENCOUNTER — Other Ambulatory Visit (HOSPITAL_BASED_OUTPATIENT_CLINIC_OR_DEPARTMENT_OTHER): Payer: Self-pay

## 2022-10-26 ENCOUNTER — Other Ambulatory Visit: Payer: Self-pay | Admitting: Family Medicine

## 2022-10-27 ENCOUNTER — Other Ambulatory Visit (HOSPITAL_BASED_OUTPATIENT_CLINIC_OR_DEPARTMENT_OTHER): Payer: Self-pay

## 2022-10-27 ENCOUNTER — Other Ambulatory Visit: Payer: Self-pay

## 2022-10-27 MED ORDER — VENLAFAXINE HCL ER 150 MG PO CP24
150.0000 mg | ORAL_CAPSULE | Freq: Every day | ORAL | 0 refills | Status: DC
  Filled 2022-10-27: qty 30, 30d supply, fill #0
  Filled 2022-11-25: qty 30, 30d supply, fill #1
  Filled 2022-12-26: qty 30, 30d supply, fill #2

## 2022-10-27 NOTE — Progress Notes (Unsigned)
Angela [name] [address] [place] [address] Phone: [phone] Subjective:   Angela [name], am serving as a scribe for Dr. Antoine Primas.  I'm seeing this patient by the request  of:  Everrett Coombe, DO  CC: Back and neck pain follow-up  [id number]  Angela [name] is a 41 y.o. female coming in with complaint of back and neck pain. OMT 09/07/2022. Patient states that she was in [place] 3 weeks and her R hip pain increased.   Medications patient has been prescribed: Effexor  Taking:         Reviewed prior external information including notes and imaging from previsou exam, outside providers and external EMR if available.   As well as notes that were available from care everywhere and other healthcare systems.  Past medical history, social, surgical and family history all reviewed in electronic medical record.  No pertanent information unless stated regarding to the chief complaint.   Past Medical History:  Diagnosis Date   Abnormal Pap smear    repeat ok   Fibromyalgia 09/02/2017   Herpes zoster    Panic attacks    PCOS (polycystic ovarian syndrome)    conceived on metformin and clomid   PP SVD 12/02/11 M 12/03/2011    No Known Allergies   Review of Systems:  No headache, visual changes, nausea, vomiting, diarrhea, constipation, dizziness, abdominal pain, skin rash, fevers, chills, night sweats, weight loss, swollen lymph nodes, body aches, joint swelling, chest pain, shortness of breath, mood changes. POSITIVE muscle aches  Objective  Blood pressure 122/72, pulse (!) 101, height 5\' 6"  (1.676 m), SpO2 99 %.   General: No apparent distress alert and oriented x3 mood and affect normal, dressed appropriately.  HEENT: Pupils equal, extraocular movements intact  Respiratory: Patient's speak in full sentences and does not appear short of breath  Cardiovascular: No lower extremity edema, non tender, no erythema  Gait MSK:  Back low  back does have some loss of lordosis.  Some tenderness to palpation in the paraspinal musculature.  Patient does have tenderness more in the thoracolumbar juncture than usual.  Osteopathic findings  C2 flexed rotated and side bent right C5 flexed rotated and side bent left T3 extended rotated and side bent right inhaled rib T11 extended rotated and side bent left L1 flexed rotated and side bent right Sacrum right on right       Assessment and Plan:  Hypermobility syndrome Hypermobility syndrome the patient did have some increasing complaints today.  This is likely secondary to the repetitive walking patient was doing and she was out of the state.  Discussed with patient icing regimen and home exercises, which activities to do and which ones to avoid.  Increase activity slowly.  Follow-up again in 6 to 8 weeks refilled patient's Effexor at 150 mg.  Fibromyalgia Stable, responding well to the Effexor.    Nonallopathic problems  Decision today to treat with OMT was based on Physical Exam  After verbal consent patient was treated with HVLA, ME, FPR techniques in cervical, rib, thoracic, lumbar, and sacral  areas  Patient tolerated the procedure well with improvement in symptoms  Patient given exercises, stretches and lifestyle modifications  See medications in patient instructions if given  Patient will follow up in 4-8 weeks     The above documentation has been reviewed and is accurate and complete , DO         Note: This dictation was prepared  with Dragon dictation along with smaller phrase technology. Any transcriptional errors that result from this process are unintentional.

## 2022-11-02 ENCOUNTER — Encounter: Payer: Self-pay | Admitting: Family Medicine

## 2022-11-02 ENCOUNTER — Ambulatory Visit: Payer: PRIVATE HEALTH INSURANCE | Admitting: Family Medicine

## 2022-11-02 VITALS — BP 122/72 | HR 101 | Ht 66.0 in

## 2022-11-02 DIAGNOSIS — M9902 Segmental and somatic dysfunction of thoracic region: Secondary | ICD-10-CM | POA: Diagnosis not present

## 2022-11-02 DIAGNOSIS — M9903 Segmental and somatic dysfunction of lumbar region: Secondary | ICD-10-CM

## 2022-11-02 DIAGNOSIS — M357 Hypermobility syndrome: Secondary | ICD-10-CM

## 2022-11-02 DIAGNOSIS — M797 Fibromyalgia: Secondary | ICD-10-CM | POA: Diagnosis not present

## 2022-11-02 DIAGNOSIS — M9908 Segmental and somatic dysfunction of rib cage: Secondary | ICD-10-CM

## 2022-11-02 DIAGNOSIS — M9901 Segmental and somatic dysfunction of cervical region: Secondary | ICD-10-CM | POA: Diagnosis not present

## 2022-11-02 DIAGNOSIS — M9904 Segmental and somatic dysfunction of sacral region: Secondary | ICD-10-CM

## 2022-11-02 NOTE — Assessment & Plan Note (Signed)
Stable, responding well to the Effexor.

## 2022-11-02 NOTE — Assessment & Plan Note (Addendum)
Hypermobility syndrome the patient did have some increasing complaints today.  This is likely secondary to the repetitive walking patient was doing and she was out of the state.  Discussed with patient icing regimen and home exercises, which activities to do and which ones to avoid.  Increase activity slowly.  Follow-up again in 6 to 8 weeks refilled patient's Effexor at 150 mg.

## 2022-11-02 NOTE — Patient Instructions (Signed)
Be careful with controlled substance See me again in 7-8 weeks

## 2022-11-25 ENCOUNTER — Other Ambulatory Visit (HOSPITAL_BASED_OUTPATIENT_CLINIC_OR_DEPARTMENT_OTHER): Payer: Self-pay

## 2022-12-15 NOTE — Progress Notes (Unsigned)
  [place] Phone: [phone] Subjective:    I'm seeing this patient by the request  of:  Luetta Nutting, DO  CC: neck and back pain   [id number]  Angela [name] is a 42 y.o. female coming in with complaint of back and neck pain. OMT 11/02/2022. Patient states that she is tight today.  Nothing severe but still tight enough that gives her some discomfort.  Patient has not needed anything such as over-the-counter medications at the moment for the pain.  Continues to try to be active overall.  Medications patient has been prescribed: Effexor  Taking:         Reviewed prior external information including notes and imaging from previsou exam, outside providers and external EMR if available.   As well as notes that were available from care everywhere and other healthcare systems.  Past medical history, social, surgical and family history all reviewed in electronic medical record.  No pertanent information unless stated regarding to the chief complaint.   Past Medical History:  Diagnosis Date   Abnormal Pap smear    repeat ok   Fibromyalgia 09/02/2017   Herpes zoster    Panic attacks    PCOS (polycystic ovarian syndrome)    conceived on metformin and clomid   PP SVD 12/02/11 M 12/03/2011    No Known Allergies   Review of Systems:  No headache, visual changes, nausea, vomiting, diarrhea, constipation, dizziness, abdominal pain, skin rash, fevers, chills, night sweats, weight loss, swollen lymph nodes, body aches, joint swelling, chest pain, shortness of breath, mood changes. POSITIVE muscle aches  Objective  Blood pressure 110/78, pulse 73, height 5' 6"$  (1.676 m), weight 149 lb (67.6 kg), SpO2 98 %.   General: No apparent distress alert and oriented x3 mood and affect normal, dressed appropriately.  HEENT: Pupils equal, extraocular movements intact  Respiratory: Patient's speak in full sentences and does  not appear short of breath  Cardiovascular: No lower extremity edema, non tender, no erythema  Gait MSK:  Back   Osteopathic findings  C2 flexed rotated and side bent right C6 flexed rotated and side bent left T3 extended rotated and side bent right inhaled rib T9 extended rotated and side bent left L2 flexed rotated and side bent right Sacrum right on right       Assessment and Plan:  Connective tissue disease ([place]) Patient is doing very well.  Does have the hypermobility syndrome still noted.  Notes icing regimen and home exercises, and does respond extremely well though to osteopathic manipulation.  Follow-up again in 6 to 8 weeks otherwise.  Fibromyalgia Continue same medications at this time this includes the Effexor.    Nonallopathic problems  Decision today to treat with OMT was based on Physical Exam  After verbal consent patient was treated with HVLA, ME, FPR techniques in cervical, rib, thoracic, lumbar, and sacral  areas  Patient tolerated the procedure well with improvement in symptoms  Patient given exercises, stretches and lifestyle modifications  See medications in patient instructions if given  Patient will follow up in 4-8 weeks    The above documentation has been reviewed and is accurate and complete Lyndal Pulley, DO          Note: This dictation was prepared with Dragon dictation along with smaller phrase technology. Any transcriptional errors that result from this process are unintentional.

## 2022-12-21 ENCOUNTER — Ambulatory Visit: Payer: PRIVATE HEALTH INSURANCE | Admitting: Family Medicine

## 2022-12-21 VITALS — BP 110/78 | HR 73 | Ht 66.0 in | Wt 149.0 lb

## 2022-12-21 DIAGNOSIS — M9902 Segmental and somatic dysfunction of thoracic region: Secondary | ICD-10-CM

## 2022-12-21 DIAGNOSIS — M9903 Segmental and somatic dysfunction of lumbar region: Secondary | ICD-10-CM

## 2022-12-21 DIAGNOSIS — M359 Systemic involvement of connective tissue, unspecified: Secondary | ICD-10-CM

## 2022-12-21 DIAGNOSIS — M9901 Segmental and somatic dysfunction of cervical region: Secondary | ICD-10-CM | POA: Diagnosis not present

## 2022-12-21 DIAGNOSIS — M9904 Segmental and somatic dysfunction of sacral region: Secondary | ICD-10-CM | POA: Diagnosis not present

## 2022-12-21 DIAGNOSIS — M797 Fibromyalgia: Secondary | ICD-10-CM | POA: Diagnosis not present

## 2022-12-21 DIAGNOSIS — M9908 Segmental and somatic dysfunction of rib cage: Secondary | ICD-10-CM | POA: Diagnosis not present

## 2022-12-21 NOTE — Assessment & Plan Note (Signed)
Patient is doing very well.  Does have the hypermobility syndrome still noted.  Notes icing regimen and home exercises, and does respond extremely well though to osteopathic manipulation.  Follow-up again in 6 to 8 weeks otherwise.

## 2022-12-21 NOTE — Patient Instructions (Signed)
Good to see you Thanks for giving me important universe questions See me again in 5-6 weeks

## 2022-12-21 NOTE — Assessment & Plan Note (Signed)
Continue same medications at this time this includes the Effexor.

## 2022-12-27 ENCOUNTER — Other Ambulatory Visit (HOSPITAL_BASED_OUTPATIENT_CLINIC_OR_DEPARTMENT_OTHER): Payer: Self-pay

## 2023-01-21 ENCOUNTER — Other Ambulatory Visit: Payer: Self-pay | Admitting: Family Medicine

## 2023-01-26 ENCOUNTER — Other Ambulatory Visit (HOSPITAL_BASED_OUTPATIENT_CLINIC_OR_DEPARTMENT_OTHER): Payer: Self-pay

## 2023-01-26 MED ORDER — VENLAFAXINE HCL ER 150 MG PO CP24
150.0000 mg | ORAL_CAPSULE | Freq: Every day | ORAL | 0 refills | Status: DC
  Filled 2023-01-26: qty 30, 30d supply, fill #0

## 2023-02-02 NOTE — Progress Notes (Unsigned)
[place] Phone: [phone] Subjective:   Angela [name], am serving as a scribe for Dr. Hulan Saas.  I'm seeing this patient by the request  of:  Luetta Nutting, DO  CC: Back and neck pain follow-up  [id number]  Angela [name] is a 42 y.o. female coming in with complaint of back and neck pain. OMT 12/21/2022. Patient states that she has been more tense due to some stress.   Medications patient has been prescribed: Effexor  Taking: Yes         Reviewed prior external information including notes and imaging from previsou exam, outside providers and external EMR if available.   As well as notes that were available from care everywhere and other healthcare systems.  Past medical history, social, surgical and family history all reviewed in electronic medical record.  [name] pertanent information unless stated regarding to the chief complaint.   Past Medical History:  Diagnosis Date   Abnormal Pap smear    repeat ok   Fibromyalgia 09/02/2017   Herpes zoster    Panic attacks    PCOS (polycystic ovarian syndrome)    conceived on metformin and clomid   PP SVD 12/02/11 M 12/03/2011    [name] Known Allergies   Review of Systems:  [name] headache, visual changes, nausea, vomiting, diarrhea, constipation, dizziness, abdominal pain, skin rash, fevers, chills, night sweats, weight loss, swollen lymph nodes, body aches, joint swelling, chest pain, shortness of breath, mood changes. POSITIVE muscle aches  Objective  Blood pressure 122/88, pulse 84, height 5\' 6"  (1.676 m), weight 148 lb (67.1 kg), SpO2 98 %.   General: [name] apparent distress alert and oriented x3 mood and affect normal, dressed appropriately.  HEENT: Pupils equal, extraocular movements intact  Respiratory: Patient's speak in full sentences and does not appear short of breath  Cardiovascular: [name] lower extremity edema, non tender, [name] erythema  Back  exam does have some loss of lordosis.  Hypermobility still noted.  Does have tightness noted in the neck bilaterally.  Osteopathic findings  C2 flexed rotated and side bent right C5 flexed rotated and side bent left T3 extended rotated and side bent right inhaled rib T9 extended rotated and side bent left L2 flexed rotated and side bent right Sacrum right on right       Assessment and Plan:  Fibromyalgia Discussed HEP  Discussed which activities to do and which ones to do discussed with patient that I do think the Effexor has helped out significantly low.  Does respond extremely well though to osteopathic manipulation as another modality for some of this.  Patient is to continue with some of the home exercises.  Do feel some of the increase in anxiety recently with patient having some illnesses with her dad and husband recently.  Follow-up with me again in 6 to 8 weeks    Nonallopathic problems  Decision today to treat with OMT was based on Physical Exam  After verbal consent patient was treated with HVLA, ME, FPR techniques in cervical, rib, thoracic, lumbar, and sacral  areas  Patient tolerated the procedure well with improvement in symptoms  Patient given exercises, stretches and lifestyle modifications  See medications in patient instructions if given  Patient will follow up in 4-8 weeks     The above documentation has been reviewed and is accurate and complete Lyndal Pulley, DO         Note: This dictation was  prepared with Dragon dictation along with smaller phrase technology. Any transcriptional errors that result from this process are unintentional.

## 2023-02-03 ENCOUNTER — Ambulatory Visit: Payer: PRIVATE HEALTH INSURANCE | Admitting: Family Medicine

## 2023-02-03 ENCOUNTER — Encounter: Payer: Self-pay | Admitting: Family Medicine

## 2023-02-03 VITALS — BP 122/88 | HR 84 | Ht 66.0 in | Wt 148.0 lb

## 2023-02-03 DIAGNOSIS — M9902 Segmental and somatic dysfunction of thoracic region: Secondary | ICD-10-CM

## 2023-02-03 DIAGNOSIS — M9903 Segmental and somatic dysfunction of lumbar region: Secondary | ICD-10-CM

## 2023-02-03 DIAGNOSIS — M9901 Segmental and somatic dysfunction of cervical region: Secondary | ICD-10-CM

## 2023-02-03 DIAGNOSIS — M9908 Segmental and somatic dysfunction of rib cage: Secondary | ICD-10-CM | POA: Diagnosis not present

## 2023-02-03 DIAGNOSIS — M797 Fibromyalgia: Secondary | ICD-10-CM | POA: Diagnosis not present

## 2023-02-03 DIAGNOSIS — M9904 Segmental and somatic dysfunction of sacral region: Secondary | ICD-10-CM

## 2023-02-03 NOTE — Assessment & Plan Note (Signed)
Discussed HEP  Discussed which activities to do and which ones to do discussed with patient that I do think the Effexor has helped out significantly low.  Does respond extremely well though to osteopathic manipulation as another modality for some of this.  Patient is to continue with some of the home exercises.  Do feel some of the increase in anxiety recently with patient having some illnesses with her dad and husband recently.  Follow-up with me again in 6 to 8 weeks

## 2023-02-03 NOTE — Patient Instructions (Addendum)
Good to see you Good luck with husband Find time for yourself See me in 6-8 weeks

## 2023-03-22 NOTE — Progress Notes (Signed)
  [name] [address] [place] [address] Phone: [phone] Subjective:   [name], am serving as a scribe for Dr. Antoine Primas. I'm seeing this patient by the request  of:  Everrett Coombe, DO  CC: Back and neck pain follow-up  [id number]  Angela [name] is a 42 y.o. female coming in with complaint of back and neck pain. OMT 02/03/2023. Patient states that she feels like she needs to be adjusted.   Medications patient has been prescribed: Effexor Taking: yes          Reviewed prior external information including notes and imaging from previsou exam, outside providers and external EMR if available.   As well as notes that were available from care everywhere and other healthcare systems.  Past medical history, social, surgical and family history all reviewed in electronic medical record.  No pertanent information unless stated regarding to the chief complaint.   Past Medical History:  Diagnosis Date   Abnormal Pap smear    repeat ok   Fibromyalgia 09/02/2017   Herpes zoster    Panic attacks    PCOS (polycystic ovarian syndrome)    conceived on metformin and clomid   PP SVD 12/02/11 M 12/03/2011    No Known Allergies   Review of Systems:  No headache, visual changes, nausea, vomiting, diarrhea, constipation, dizziness, abdominal pain, skin rash, fevers, chills, night sweats, weight loss, swollen lymph nodes, body aches, joint swelling, chest pain, shortness of breath, mood changes. POSITIVE muscle aches  Objective  Blood pressure 110/70, pulse 91, height 5\' 6"  (1.676 m), weight 149 lb (67.6 kg), SpO2 98 %.   General: No apparent distress alert and oriented x3 mood and affect normal, dressed appropriately.  HEENT: Pupils equal, extraocular movements intact  Respiratory: Patient's speak in full sentences and does not appear short of breath  Cardiovascular: No lower extremity edema, non tender, no erythema  Neck exam does  have hypermobility tightness though on right  Right TTP  Osteopathic findings  C2 flexed rotated and side bent right C7 flexed rotated and side bent right T3 extended rotated and side bent right inhaled rib T9 extended rotated and side bent left L2 flexed rotated and side bent right L5 flexed rotated and side bent left. Sacrum right on right       Assessment and Plan:  Rib pain Patient has been doing unremarkable at the moment.  Patient continues to do well with her new role discussed icing regimen and home exercises, we discussed which activities to do and which ones to avoid.  Increase activity slowly over the course of next several weeks.  Follow-up with me again in 6 to 8 weeks.    Nonallopathic problems  Decision today to treat with OMT was based on Physical Exam  After verbal consent patient was treated with HVLA, ME, FPR techniques in cervical, rib, thoracic, lumbar, and sacral  areas  Patient tolerated the procedure well with improvement in symptoms  Patient given exercises, stretches and lifestyle modifications  See medications in patient instructions if given  Patient will follow up in 4-8 weeks    The above documentation has been reviewed and is accurate and complete Judi Saa, DO          Note: This dictation was prepared with Dragon dictation along with smaller phrase technology. Any transcriptional errors that result from this process are unintentional.

## 2023-03-24 ENCOUNTER — Encounter: Payer: Self-pay | Admitting: Family Medicine

## 2023-03-24 ENCOUNTER — Ambulatory Visit: Payer: PRIVATE HEALTH INSURANCE | Admitting: Family Medicine

## 2023-03-24 VITALS — BP 110/70 | HR 91 | Ht 66.0 in | Wt 149.0 lb

## 2023-03-24 DIAGNOSIS — R0781 Pleurodynia: Secondary | ICD-10-CM | POA: Diagnosis not present

## 2023-03-24 DIAGNOSIS — M9903 Segmental and somatic dysfunction of lumbar region: Secondary | ICD-10-CM | POA: Diagnosis not present

## 2023-03-24 DIAGNOSIS — M9902 Segmental and somatic dysfunction of thoracic region: Secondary | ICD-10-CM

## 2023-03-24 DIAGNOSIS — M9901 Segmental and somatic dysfunction of cervical region: Secondary | ICD-10-CM | POA: Diagnosis not present

## 2023-03-24 DIAGNOSIS — M9904 Segmental and somatic dysfunction of sacral region: Secondary | ICD-10-CM

## 2023-03-24 DIAGNOSIS — M9908 Segmental and somatic dysfunction of rib cage: Secondary | ICD-10-CM

## 2023-03-24 NOTE — Patient Instructions (Addendum)
Good to see you Terrified of ducks See me in 6-8 weeks

## 2023-03-24 NOTE — Assessment & Plan Note (Signed)
Patient has been doing unremarkable at the moment.  Patient continues to do well with her new role discussed icing regimen and home exercises, we discussed which activities to do and which ones to avoid.  Increase activity slowly over the course of next several weeks.  Follow-up with me again in 6 to 8 weeks.

## 2023-05-19 ENCOUNTER — Ambulatory Visit: Payer: PRIVATE HEALTH INSURANCE | Admitting: Family Medicine

## 2023-06-07 ENCOUNTER — Encounter: Payer: Self-pay | Admitting: Family Medicine

## 2023-06-07 ENCOUNTER — Ambulatory Visit (INDEPENDENT_AMBULATORY_CARE_PROVIDER_SITE_OTHER): Payer: PRIVATE HEALTH INSURANCE | Admitting: Family Medicine

## 2023-06-07 VITALS — BP 103/68 | HR 91 | Ht 66.0 in | Wt 149.0 lb

## 2023-06-07 DIAGNOSIS — F419 Anxiety disorder, unspecified: Secondary | ICD-10-CM

## 2023-06-07 DIAGNOSIS — Z0000 Encounter for general adult medical examination without abnormal findings: Secondary | ICD-10-CM

## 2023-06-07 DIAGNOSIS — R5383 Other fatigue: Secondary | ICD-10-CM

## 2023-06-07 DIAGNOSIS — E782 Mixed hyperlipidemia: Secondary | ICD-10-CM

## 2023-06-07 DIAGNOSIS — Z1159 Encounter for screening for other viral diseases: Secondary | ICD-10-CM

## 2023-06-07 DIAGNOSIS — F32A Depression, unspecified: Secondary | ICD-10-CM

## 2023-06-07 MED ORDER — ALPRAZOLAM 0.5 MG PO TABS
0.5000 mg | ORAL_TABLET | Freq: Every day | ORAL | 1 refills | Status: DC | PRN
Start: 2023-06-07 — End: 2024-06-22

## 2023-06-07 NOTE — Progress Notes (Unsigned)
[name] [address] [place] [address] Phone: [phone] Subjective:   [name], am serving as a scribe for Dr. Antoine Primas.  I'm seeing this patient by the request  of:  Everrett Coombe, DO  CC: Back and neck pain follow-up  [id number]  Angela [name] is a 42 y.o. female coming in with complaint of back and neck pain. OMT 03/24/2023. Patient states   Medications patient has been prescribed: Effexor  Taking:         Reviewed prior external information including notes and imaging from previsou exam, outside providers and external EMR if available.   As well as notes that were available from care everywhere and other healthcare systems.  Past medical history, social, surgical and family history all reviewed in electronic medical record.  No pertanent information unless stated regarding to the chief complaint.   Past Medical History:  Diagnosis Date   Abnormal Pap smear    repeat ok   Fibromyalgia 09/02/2017   Herpes zoster    Panic attacks    PCOS (polycystic ovarian syndrome)    conceived on metformin and clomid   PP SVD 12/02/11 M 12/03/2011    No Known Allergies   Review of Systems:  No headache, visual changes, nausea, vomiting, diarrhea, constipation, dizziness, abdominal pain, skin rash, fevers, chills, night sweats, weight loss, swollen lymph nodes, body aches, joint swelling, chest pain, shortness of breath, mood changes. POSITIVE muscle aches  Objective  Blood pressure 112/76, pulse 87, height 5\' 6"  (1.676 m), weight 149 lb (67.6 kg).   General: No apparent distress alert and oriented x3 mood and affect normal, dressed appropriately.  HEENT: Pupils equal, extraocular movements intact  Respiratory: Patient's speak in full sentences and does not appear short of breath  Cardiovascular: No lower extremity edema, non tender, no erythema  Gait MSK:  Back does have significant tightness noted.  Seems to be  more in the right periscapular area patient does have some limited sidebending of the neck bilaterally.  Tightness noted at the occipital region on the right greater than the left.  Osteopathic findings  C2 flexed rotated and side bent right C6 flexed rotated and side bent left T3 extended rotated and side bent right inhaled rib T9 extended rotated and side bent left L2 flexed rotated and side bent right Sacrum right on right    Assessment and Plan:  Hypermobility syndrome Hypermobility as well as patient has had some increasing stress that is likely contributing.  Not working out as regularly as she would like to.  Discussed icing regimen and home exercises.  Continue the Effexor.  Patient does not need another refill at the moment.  Will follow-up with me again in 6 to 8 weeks    Nonallopathic problems  Decision today to treat with OMT was based on Physical Exam  After verbal consent patient was treated with HVLA, ME, FPR techniques in cervical, rib, thoracic, lumbar, and sacral  areas  Patient tolerated the procedure well with improvement in symptoms  Patient given exercises, stretches and lifestyle modifications  See medications in patient instructions if given  Patient will follow up in 4-8 weeks    The above documentation has been reviewed and is accurate and complete Judi Saa, DO          Note: This dictation was prepared with Dragon dictation along with smaller phrase technology. Any transcriptional errors that result from this process are unintentional.

## 2023-06-07 NOTE — Progress Notes (Signed)
Angela [name] - 42 y.o. female MRN [id number]  Date of birth: 10-19-1981  Subjective Chief Complaint  Patient presents with   Annual Exam    HPI Angela [name] is a 42 y.o. female here today for annual exam.   She reports that she is doing well.   She continues to see Dr. Katrinka Blazing for osteopathic adjustments. Remains on effexor.  Rare use of alprazolam.   She remains pretty active.  She feels that diet is pretty good.   She is a non-smoker.  Occasional EtOH use.   Review of Systems  Constitutional:  Negative for chills, fever, malaise/fatigue and weight loss.  HENT:  Negative for congestion, ear pain and sore throat.   Eyes:  Negative for blurred vision, double vision and pain.  Respiratory:  Negative for cough and shortness of breath.   Cardiovascular:  Negative for chest pain and palpitations.  Gastrointestinal:  Negative for abdominal pain, blood in stool, constipation, heartburn and nausea.  Genitourinary:  Negative for dysuria and urgency.  Musculoskeletal:  Negative for joint pain and myalgias.  Neurological:  Negative for dizziness and headaches.  Endo/Heme/Allergies:  Does not bruise/bleed easily.  Psychiatric/Behavioral:  Negative for depression. The patient is not nervous/anxious and does not have insomnia.     No Known Allergies  Past Medical History:  Diagnosis Date   Abnormal Pap smear    repeat ok   Fibromyalgia 09/02/2017   Herpes zoster    Panic attacks    PCOS (polycystic ovarian syndrome)    conceived on metformin and clomid   PP SVD 12/02/11 M 12/03/2011    Past Surgical History:  Procedure Laterality Date   BREAST BIOPSY Right    Benign   WISDOM TOOTH EXTRACTION      Social History   Socioeconomic History   Marital status: Married    Spouse name: Not on file   Number of children: 1   Years of education: 16   Highest education level: Not on file  Occupational History   Occupation: Radiation Therapist  Tobacco Use   Smoking status:  Former    Current packs/day: 0.00    Types: Cigarettes    Quit date: 11/01/2009    Years since quitting: 13.6   Smokeless tobacco: Never  Vaping Use   Vaping status: Never Used  Substance and Sexual Activity   Alcohol use: Yes    Alcohol/week: 2.0 standard drinks of alcohol    Types: 2 Cans of beer per week   Drug use: No   Sexual activity: Yes    Partners: Male    Birth control/protection: I.U.D.  Other Topics Concern   Not on file  Social History Narrative   Works as a Systems developer; Fun: [name] out with family.    Denies any religious beliefs effecting health care.    Lives with husband and son in a 2 story home.     Education: college.    Social Determinants of Health   Financial Resource Strain: Low Risk  (05/01/2019)   Overall Financial Resource Strain (CARDIA)    Difficulty of Paying Living Expenses: Not hard at all  Food Insecurity: No Food Insecurity (05/01/2019)   Hunger Vital Sign    Worried About Running Out of Food in the Last Year: Never true    Ran Out of Food in the Last Year: Never true  Transportation Needs: No Transportation Needs (05/01/2019)   PRAPARE - Administrator, Civil Service (Medical): No  Lack of Transportation (Non-Medical): No  Physical Activity: Not on file  Stress: No Stress Concern Present (05/01/2019)   [employer] of Occupational Health - Occupational Stress Questionnaire    Feeling of Stress : Only a little  Social Connections: Unknown (05/01/2019)   Social Connection and Isolation Panel [NHANES]    Frequency of Communication with Friends and Family: Three times a week    Frequency of Social Gatherings with Friends and Family: Three times a week    Attends Religious Services: Not on file    Active Member of Clubs or Organizations: Not on file    Attends Banker Meetings: Not on file    Marital Status: Married    Family History  Problem Relation Age of Onset   Diabetes Father        uncontrolled    Hypertension Father    Hyperlipidemia Father    Alcohol abuse Other    Arthritis Other    Ovarian cancer Paternal Grandmother    Pancreatic cancer Cousin        paternal 2nd cousin   Anesthesia problems Neg Hx     Health Maintenance  Topic Date Due   Hepatitis C Screening  Never done   INFLUENZA VACCINE  08/05/2023 (Originally 06/02/2023)   COVID-19 Vaccine (4 - 2023-24 season) 08/23/2023 (Originally 07/02/2022)   PAP SMEAR-Modifier  03/08/2025   DTaP/Tdap/Td (3 - Td or Tdap) 08/18/2026   HIV Screening  Completed   HPV VACCINES  Aged Out     ----------------------------------------------------------------------------------------------------------------------------------------------------------------------------------------------------------------- Physical Exam BP 103/68 (BP Location: Left Arm, Patient Position: Sitting, Cuff Size: Normal)   Pulse 91   Ht 5\' 6"  (1.676 m)   Wt 149 lb (67.6 kg)   SpO2 100%   BMI 24.05 kg/m   Physical Exam Constitutional:      General: She is not in acute distress. HENT:     Head: Normocephalic and atraumatic.     Right Ear: Tympanic membrane and ear canal normal.     Left Ear: Tympanic membrane and ear canal normal.     Nose: Nose normal.  Eyes:     General: No scleral icterus.    Conjunctiva/sclera: Conjunctivae normal.  Neck:     Thyroid: No thyromegaly.  Cardiovascular:     Rate and Rhythm: Normal rate and regular rhythm.     Heart sounds: Normal heart sounds.  Pulmonary:     Effort: Pulmonary effort is normal.     Breath sounds: Normal breath sounds.  Abdominal:     General: Bowel sounds are normal. There is no distension.     Palpations: Abdomen is soft.     Tenderness: There is no abdominal tenderness. There is no guarding.  Musculoskeletal:        General: Normal range of motion.     Cervical back: Normal range of motion and neck supple.  Lymphadenopathy:     Cervical: No cervical adenopathy.  Skin:    General: Skin is  warm and dry.     Findings: No rash.  Neurological:     General: No focal deficit present.     Mental Status: She is alert and oriented to person, place, and time.     Cranial Nerves: No cranial nerve deficit.     Coordination: Coordination normal.  Psychiatric:        Mood and Affect: Mood normal.        Behavior: Behavior normal.     ------------------------------------------------------------------------------------------------------------------------------------------------------------------------------------------------------------------- Assessment and Plan  Well adult  exam Well adult Orders Placed This Encounter  Procedures   Hepatitis C Antibody   CMP14+EGFR   CBC w/Diff/Platelet   Lipid Panel With LDL/HDL Ratio   TSH   Vitamin D (25 hydroxy)  Screenings:  per lab orders Immunizations:  UTD Anticipatory guidance/Risk factor reduction:  Recommendations per AVS.    No orders of the defined types were placed in this encounter.   No follow-ups on file.    This visit occurred during the SARS-CoV-2 public health emergency.  Safety protocols were in place, including screening questions prior to the visit, additional usage of staff PPE, and extensive cleaning of exam room while observing appropriate contact time as indicated for disinfecting solutions.

## 2023-06-07 NOTE — Assessment & Plan Note (Signed)
Well adult Orders Placed This Encounter  Procedures   Hepatitis C Antibody   CMP14+EGFR   CBC w/Diff/Platelet   Lipid Panel With LDL/HDL Ratio   TSH   Vitamin D (25 hydroxy)  Screenings:  per lab orders Immunizations:  UTD Anticipatory guidance/Risk factor reduction:  Recommendations per AVS.

## 2023-06-07 NOTE — Patient Instructions (Signed)
Preventive Care 40-41 Years Old, Female Preventive care refers to lifestyle choices and visits with your health care provider that can promote health and wellness. Preventive care visits are also called wellness exams. What can I expect for my preventive care visit? Counseling Your health care provider may ask you questions about your: Medical history, including: Past medical problems. Family medical history. Pregnancy history. Current health, including: Menstrual cycle. Method of birth control. Emotional well-being. Home life and relationship well-being. Sexual activity and sexual health. Lifestyle, including: Alcohol, nicotine or tobacco, and drug use. Access to firearms. Diet, exercise, and sleep habits. Work and work environment. Sunscreen use. Safety issues such as seatbelt and bike helmet use. Physical exam Your health care provider will check your: Height and weight. These may be used to calculate your BMI (body mass index). BMI is a measurement that tells if you are at a healthy weight. Waist circumference. This measures the distance around your waistline. This measurement also tells if you are at a healthy weight and may help predict your risk of certain diseases, such as type 2 diabetes and high blood pressure. Heart rate and blood pressure. Body temperature. Skin for abnormal spots. What immunizations do I need?  Vaccines are usually given at various ages, according to a schedule. Your health care provider will recommend vaccines for you based on your age, medical history, and lifestyle or other factors, such as travel or where you work. What tests do I need? Screening Your health care provider may recommend screening tests for certain conditions. This may include: Lipid and cholesterol levels. Diabetes screening. This is done by checking your blood sugar (glucose) after you have not eaten for a while (fasting). Pelvic exam and Pap test. Hepatitis B test. Hepatitis C  test. HIV (human immunodeficiency virus) test. STI (sexually transmitted infection) testing, if you are at risk. Lung cancer screening. Colorectal cancer screening. Mammogram. Talk with your health care provider about when you should start having regular mammograms. This may depend on whether you have a family history of breast cancer. BRCA-related cancer screening. This may be done if you have a family history of breast, ovarian, tubal, or peritoneal cancers. Bone density scan. This is done to screen for osteoporosis. Talk with your health care provider about your test results, treatment options, and if necessary, the need for more tests. Follow these instructions at home: Eating and drinking  Eat a diet that includes fresh fruits and vegetables, whole grains, lean protein, and low-fat dairy products. Take vitamin and mineral supplements as recommended by your health care provider. Do not drink alcohol if: Your health care provider tells you not to drink. You are pregnant, may be pregnant, or are planning to become pregnant. If you drink alcohol: Limit how much you have to 0-1 drink a day. Know how much alcohol is in your drink. In the [place] one drink equals one 12 oz bottle of beer (355 mL), one 5 oz glass of wine (148 mL), or one 1 oz glass of hard liquor (44 mL). Lifestyle Brush your teeth every morning and night with fluoride toothpaste. Floss one time each day. Exercise for at least 30 minutes 5 or more days each week. Do not use any products that contain nicotine or tobacco. These products include cigarettes, chewing tobacco, and vaping devices, such as e-cigarettes. If you need help quitting, ask your health care provider. Do not use drugs. If you are sexually active, practice safe sex. Use a condom or other form of protection to   prevent STIs. If you do not wish to become pregnant, use a form of birth control. If you plan to become pregnant, see your health care provider for a  prepregnancy visit. Take aspirin only as told by your health care provider. Make sure that you understand how much to take and what form to take. Work with your health care provider to find out whether it is safe and beneficial for you to take aspirin daily. Find healthy ways to manage stress, such as: Meditation, yoga, or listening to music. Journaling. Talking to a trusted person. Spending time with friends and family. Minimize exposure to UV radiation to reduce your risk of skin cancer. Safety Always wear your seat belt while driving or riding in a vehicle. Do not drive: If you have been drinking alcohol. Do not ride with someone who has been drinking. When you are tired or distracted. While texting. If you have been using any mind-altering substances or drugs. Wear a helmet and other protective equipment during sports activities. If you have firearms in your house, make sure you follow all gun safety procedures. Seek help if you have been physically or sexually abused. What's next? Visit your health care provider once a year for an annual wellness visit. Ask your health care provider how often you should have your eyes and teeth checked. Stay up to date on all vaccines. This information is not intended to replace advice given to you by your health care provider. Make sure you discuss any questions you have with your health care provider. Document Revised: 04/15/2021 Document Reviewed: 04/15/2021 Elsevier Patient Education  2024 Elsevier Inc.  

## 2023-06-08 ENCOUNTER — Encounter: Payer: Self-pay | Admitting: Family Medicine

## 2023-06-08 ENCOUNTER — Ambulatory Visit: Payer: PRIVATE HEALTH INSURANCE | Admitting: Family Medicine

## 2023-06-08 VITALS — BP 112/76 | HR 87 | Ht 66.0 in | Wt 149.0 lb

## 2023-06-08 DIAGNOSIS — M9902 Segmental and somatic dysfunction of thoracic region: Secondary | ICD-10-CM | POA: Diagnosis not present

## 2023-06-08 DIAGNOSIS — M9908 Segmental and somatic dysfunction of rib cage: Secondary | ICD-10-CM | POA: Diagnosis not present

## 2023-06-08 DIAGNOSIS — M9904 Segmental and somatic dysfunction of sacral region: Secondary | ICD-10-CM | POA: Diagnosis not present

## 2023-06-08 DIAGNOSIS — M357 Hypermobility syndrome: Secondary | ICD-10-CM | POA: Diagnosis not present

## 2023-06-08 DIAGNOSIS — M9903 Segmental and somatic dysfunction of lumbar region: Secondary | ICD-10-CM

## 2023-06-08 DIAGNOSIS — M9901 Segmental and somatic dysfunction of cervical region: Secondary | ICD-10-CM

## 2023-06-08 NOTE — Patient Instructions (Signed)
Good luck with contracts See me in 6-8 weeiks

## 2023-06-08 NOTE — Assessment & Plan Note (Signed)
Hypermobility as well as patient has had some increasing stress that is likely contributing.  Not working out as regularly as she would like to.  Discussed icing regimen and home exercises.  Continue the Effexor.  Patient does not need another refill at the moment.  Will follow-up with me again in 6 to 8 weeks

## 2023-06-27 ENCOUNTER — Other Ambulatory Visit: Payer: Self-pay | Admitting: Family Medicine

## 2023-06-28 ENCOUNTER — Encounter: Payer: Self-pay | Admitting: Family Medicine

## 2023-07-22 ENCOUNTER — Ambulatory Visit: Payer: PRIVATE HEALTH INSURANCE | Admitting: Family Medicine

## 2023-07-27 NOTE — Progress Notes (Signed)
  [name] [address] [place] [address] Phone: [phone] Subjective:    I'm seeing this patient by the request  of:  Everrett Coombe, DO  CC: Low back and neck pain follow-up  BJY:NWGNFAOZHY  Angela [name] is a 42 y.o. female coming in with complaint of back and neck pain. OMT 06/08/2023. Patient states doing well but has had some stress.  Will be traveling and near future. Concerned about this sitting for longer duration.  Medications patient has been prescribed: Effexor  Taking: Yes       Past Medical History:  Diagnosis Date   Abnormal Pap smear    repeat ok   Fibromyalgia 09/02/2017   Herpes zoster    Panic attacks    PCOS (polycystic ovarian syndrome)    conceived on metformin and clomid   PP SVD 12/02/11 M 12/03/2011    No Known Allergies   Review of Systems:  No headache, visual changes, nausea, vomiting, diarrhea, constipation, dizziness, abdominal pain, skin rash, fevers, chills, night sweats, weight loss, swollen lymph nodes, body aches, joint swelling, chest pain, shortness of breath, mood changes. POSITIVE muscle aches  Objective  Height 5\' 6"  (1.676 m).   General: No apparent distress alert and oriented x3 mood and affect normal, dressed appropriately.  HEENT: Pupils equal, extraocular movements intact  Respiratory: Patient's speak in full sentences and does not appear short of breath  Cardiovascular: No lower extremity edema, non tender, no erythema  MSK:  Back hypermobility noted but does have tenderness to palpation in the thoracolumbar juncture as well as the sacroiliac joint.  Tightness noted on the right side of the paraspinal musculature of the neck as well as in the paraspinal and scapular area.  Osteopathic findings  C2 flexed rotated and side bent right C5 flexed rotated and side bent left T3 extended rotated and side bent right inhaled rib T7 extended rotated and side bent left L3 flexed rotated and  side bent right Sacrum right on right     Assessment and Plan:  Chronic pain syndrome Patient does have chronic pain syndrome.  Discussed icing regimen and home exercises, discussed which activities to do and which ones to avoid.  Increase activity slowly.  Discussed icing regimen.  Follow-up again in 6 to 8 weeks.    Nonallopathic problems  Decision today to treat with OMT was based on Physical Exam  After verbal consent patient was treated with HVLA, ME, FPR techniques in cervical, rib, thoracic, lumbar, and sacral  areas  Patient tolerated the procedure well with improvement in symptoms  Patient given exercises, stretches and lifestyle modifications  See medications in patient instructions if given  Patient will follow up in 4-8 weeks    The above documentation has been reviewed and is accurate and complete Judi Saa, DO          Note: This dictation was prepared with Dragon dictation along with smaller phrase technology. Any transcriptional errors that result from this process are unintentional.

## 2023-07-29 ENCOUNTER — Ambulatory Visit: Payer: PRIVATE HEALTH INSURANCE | Admitting: Family Medicine

## 2023-07-29 VITALS — Ht 66.0 in

## 2023-07-29 DIAGNOSIS — M9903 Segmental and somatic dysfunction of lumbar region: Secondary | ICD-10-CM

## 2023-07-29 DIAGNOSIS — G894 Chronic pain syndrome: Secondary | ICD-10-CM | POA: Diagnosis not present

## 2023-07-29 DIAGNOSIS — M9901 Segmental and somatic dysfunction of cervical region: Secondary | ICD-10-CM

## 2023-07-29 DIAGNOSIS — M9904 Segmental and somatic dysfunction of sacral region: Secondary | ICD-10-CM | POA: Diagnosis not present

## 2023-07-29 DIAGNOSIS — M9902 Segmental and somatic dysfunction of thoracic region: Secondary | ICD-10-CM

## 2023-07-29 DIAGNOSIS — M9908 Segmental and somatic dysfunction of rib cage: Secondary | ICD-10-CM | POA: Diagnosis not present

## 2023-07-29 NOTE — Assessment & Plan Note (Signed)
Patient does have chronic pain syndrome.  Discussed icing regimen and home exercises, discussed which activities to do and which ones to avoid.  Increase activity slowly.  Discussed icing regimen.  Follow-up again in 6 to 8 weeks.

## 2023-08-09 ENCOUNTER — Telehealth: Payer: Self-pay

## 2023-08-09 NOTE — Telephone Encounter (Signed)
NA

## 2023-09-08 NOTE — Progress Notes (Signed)
[name] [address] [place] [address] Phone: [phone] Subjective:   [name], am serving as a scribe for Dr. Antoine Primas.  I'm seeing this patient by the request  of:  Everrett Coombe, DO  CC: back and neck pain follow up   BJY:NWGNFAOZHY  Angela [name] is a 42 y.o. female coming in with complaint of back and neck pain. OMT 07/29/2023. Patient states that she started to workout more doing 2 a day workouts. Is ok today but has some sharp pain at times in R greater trochanter.     Medications patient has been prescribed: Effexor  Taking:      Reviewed prior external information including notes and imaging from previsou exam, outside providers and external EMR if available.   As well as notes that were available from care everywhere and other healthcare systems.  Past medical history, social, surgical and family history all reviewed in electronic medical record.  No pertanent information unless stated regarding to the chief complaint.   Past Medical History:  Diagnosis Date   Abnormal Pap smear    repeat ok   Fibromyalgia 09/02/2017   Herpes zoster    Panic attacks    PCOS (polycystic ovarian syndrome)    conceived on metformin and clomid   PP SVD 12/02/11 M 12/03/2011    No Known Allergies   Review of Systems:  No headache, visual changes, nausea, vomiting, diarrhea, constipation, dizziness, abdominal pain, skin rash, fevers, chills, night sweats, weight loss, swollen lymph nodes, body aches, joint swelling, chest pain, shortness of breath, mood changes. POSITIVE muscle aches  Objective  Blood pressure 110/78, pulse 91, height 5\' 6"  (1.676 m), weight 149 lb (67.6 kg), SpO2 98%.   General: No apparent distress alert and oriented x3 mood and affect normal, dressed appropriately.  HEENT: Pupils equal, extraocular movements intact  Respiratory: Patient's speak in full sentences and does not appear short of breath   Cardiovascular: No lower extremity edema, non tender, no erythema  MSK:  Back does have some loss of lordosis.  Hypermobility noted.  The patient's right hip does have posterior rotation noted.  This is different than patient's usual.  Tightness with FABER test noted.  Neurovascular intact  Osteopathic findings  C3 flexed rotated and side bent right C6 flexed rotated and side bent left T3 extended rotated and side bent right inhaled rib T9 extended rotated and side bent left L3 flexed rotated and side bent right Sacrum right on right       Assessment and Plan:  Connective tissue disease (HCC) Connective tissue disorder noted still.  Does have hypermobility but still has tightness noted on the right side of the back.  Discussed icing regimen and home exercises, discussed which activities to do and which ones to avoid.  Increase activity slowly over the course of next several weeks.  Discussed icing regimen.  Follow-up again 6 to 8 weeks Discussed Effexor and other vitamin supplementations and the potential interactions  Nonallopathic problems  Decision today to treat with OMT was based on Physical Exam  After verbal consent patient was treated with HVLA, ME, FPR techniques in cervical, rib, thoracic, lumbar, and sacral  areas  Patient tolerated the procedure well with improvement in symptoms  Patient given exercises, stretches and lifestyle modifications  See medications in patient instructions if given  Patient will follow up in 4-8 weeks    The above documentation has been reviewed and is accurate and complete  Judi Saa, DO          Note: This dictation was prepared with Dragon dictation along with smaller phrase technology. Any transcriptional errors that result from this process are unintentional.

## 2023-09-12 ENCOUNTER — Encounter: Payer: Self-pay | Admitting: Family Medicine

## 2023-09-12 ENCOUNTER — Ambulatory Visit: Payer: PRIVATE HEALTH INSURANCE | Admitting: Family Medicine

## 2023-09-12 VITALS — BP 110/78 | HR 91 | Ht 66.0 in | Wt 149.0 lb

## 2023-09-12 DIAGNOSIS — M9903 Segmental and somatic dysfunction of lumbar region: Secondary | ICD-10-CM | POA: Diagnosis not present

## 2023-09-12 DIAGNOSIS — M9904 Segmental and somatic dysfunction of sacral region: Secondary | ICD-10-CM | POA: Diagnosis not present

## 2023-09-12 DIAGNOSIS — M9902 Segmental and somatic dysfunction of thoracic region: Secondary | ICD-10-CM | POA: Diagnosis not present

## 2023-09-12 DIAGNOSIS — M9908 Segmental and somatic dysfunction of rib cage: Secondary | ICD-10-CM | POA: Diagnosis not present

## 2023-09-12 DIAGNOSIS — M9901 Segmental and somatic dysfunction of cervical region: Secondary | ICD-10-CM

## 2023-09-12 DIAGNOSIS — M359 Systemic involvement of connective tissue, unspecified: Secondary | ICD-10-CM

## 2023-09-12 NOTE — Assessment & Plan Note (Signed)
Connective tissue disorder noted still.  Does have hypermobility but still has tightness noted on the right side of the back.  Discussed icing regimen and home exercises, discussed which activities to do and which ones to avoid.  Increase activity slowly over the course of next several weeks.  Discussed icing regimen.  Follow-up again 6 to 8 weeks

## 2023-09-12 NOTE — Patient Instructions (Addendum)
Black cohosh DHEA 50mg  for 1 month See me again in 5-6 weeks

## 2023-10-13 NOTE — Progress Notes (Signed)
[name] [address] [place] [address] Phone: [phone] Subjective:   Angela [name], am serving as a scribe for Dr. Antoine Primas.  I'm seeing this patient by the request  of:  Everrett Coombe, DO  CC: back and neck pain   [id number]  Angela [name] is a 42 y.o. female coming in with complaint of back and neck pain. OMT 09/12/2023. Patient states that R hip has been more painful recently over glute. Trying to be more active.   Medications patient has been prescribed: Effexor  Taking: Yes         Reviewed prior external information including notes and imaging from previsou exam, outside providers and external EMR if available.   As well as notes that were available from care everywhere and other healthcare systems.  Past medical history, social, surgical and family history all reviewed in electronic medical record.  No pertanent information unless stated regarding to the chief complaint.   Past Medical History:  Diagnosis Date   Abnormal Pap smear    repeat ok   Fibromyalgia 09/02/2017   Herpes zoster    Panic attacks    PCOS (polycystic ovarian syndrome)    conceived on metformin and clomid   PP SVD 12/02/11 M 12/03/2011    No Known Allergies   Review of Systems:  No headache, visual changes, nausea, vomiting, diarrhea, constipation, dizziness, abdominal pain, skin rash, fevers, chills, night sweats, weight loss, swollen lymph nodes, joint swelling, chest pain, shortness of breath, mood changes. POSITIVE muscle aches, body aches  Objective  Blood pressure 104/72, pulse 93, height 5\' 6"  (1.676 m), weight 151 lb (68.5 kg), SpO2 98%.   General: No apparent distress alert and oriented x3 mood and affect normal, dressed appropriately.  HEENT: Pupils equal, extraocular movements intact  Respiratory: Patient's speak in full sentences and does not appear short of breath  Cardiovascular: No lower extremity edema, non tender,  no erythema  Gait normal MSK:  Back does have some loss lordosis noted.  Osteopathic findings  C2 flexed rotated and side bent right C6 flexed rotated and side bent left T3 extended rotated and side bent right inhaled rib T9 extended rotated and side bent left L2 flexed rotated and side bent right L5 flexed rotated and side bent right Sacrum right on right       Assessment and Plan:  Connective tissue disease (HCC) Continued hypermobility.  Is causing some increased discomfort in the piriformis muscle.  Discussed icing regimen and home exercises, discussed which activities to do and alternatives to avoid.  Increase activity slowly over the course of next several weeks.  Follow-up again in 6 to 8 weeks.  Fibromyalgia Stable on the Effexor.  Continue to monitor.  Piriformis syndrome Patient has had this for quite some time.  Has responded well to the injection previously and if needed we can consider it.  Do feel that the Effexor at the current dosing of 75 mg is beneficial for this individual.  Follow-up again 6 to 8 weeks.  Discussed manual massage therapy    Nonallopathic problems  Decision today to treat with OMT was based on Physical Exam  After verbal consent patient was treated with HVLA, ME, FPR techniques in cervical, rib, thoracic, lumbar, and sacral  areas  Patient tolerated the procedure well with improvement in symptoms  Patient given exercises, stretches and lifestyle modifications  See medications in patient instructions if given  Patient will follow up in 4-8  weeks     The above documentation has been reviewed and is accurate and complete Judi Saa, DO         Note: This dictation was prepared with Dragon dictation along with smaller phrase technology. Any transcriptional errors that result from this process are unintentional.

## 2023-10-17 ENCOUNTER — Ambulatory Visit: Payer: PRIVATE HEALTH INSURANCE | Admitting: Family Medicine

## 2023-10-17 ENCOUNTER — Encounter: Payer: Self-pay | Admitting: Family Medicine

## 2023-10-17 VITALS — BP 104/72 | HR 93 | Ht 66.0 in | Wt 151.0 lb

## 2023-10-17 DIAGNOSIS — M9901 Segmental and somatic dysfunction of cervical region: Secondary | ICD-10-CM

## 2023-10-17 DIAGNOSIS — M9902 Segmental and somatic dysfunction of thoracic region: Secondary | ICD-10-CM | POA: Diagnosis not present

## 2023-10-17 DIAGNOSIS — M797 Fibromyalgia: Secondary | ICD-10-CM | POA: Diagnosis not present

## 2023-10-17 DIAGNOSIS — M9904 Segmental and somatic dysfunction of sacral region: Secondary | ICD-10-CM | POA: Diagnosis not present

## 2023-10-17 DIAGNOSIS — M359 Systemic involvement of connective tissue, unspecified: Secondary | ICD-10-CM | POA: Diagnosis not present

## 2023-10-17 DIAGNOSIS — M9903 Segmental and somatic dysfunction of lumbar region: Secondary | ICD-10-CM

## 2023-10-17 DIAGNOSIS — M9908 Segmental and somatic dysfunction of rib cage: Secondary | ICD-10-CM

## 2023-10-17 DIAGNOSIS — G5701 Lesion of sciatic nerve, right lower limb: Secondary | ICD-10-CM | POA: Diagnosis not present

## 2023-10-17 NOTE — Assessment & Plan Note (Signed)
Continued hypermobility.  Is causing some increased discomfort in the piriformis muscle.  Discussed icing regimen and home exercises, discussed which activities to do and alternatives to avoid.  Increase activity slowly over the course of next several weeks.  Follow-up again in 6 to 8 weeks.

## 2023-10-17 NOTE — Assessment & Plan Note (Signed)
Stable on the Effexor.  Continue to monitor.

## 2023-10-17 NOTE — Patient Instructions (Signed)
Good to see you Tennis ball in back R pocket Thanks for picture See me in 7-8 weeks

## 2023-10-17 NOTE — Assessment & Plan Note (Signed)
Patient has had this for quite some time.  Has responded well to the injection previously and if needed we can consider it.  Do feel that the Effexor at the current dosing of 75 mg is beneficial for this individual.  Follow-up again 6 to 8 weeks.  Discussed manual massage therapy

## 2023-10-19 ENCOUNTER — Other Ambulatory Visit: Payer: Self-pay | Admitting: Family Medicine

## 2023-12-01 NOTE — Progress Notes (Signed)
 [name] [address] [place] [address] Phone: [phone] Subjective:   Angela [name], am serving as a scribe for Dr. Arthea Claudene.  I'm seeing this patient by the request  of:  Alvia Bring, DO  CC: Back and neck pain follow-up  YEP:Dlagzrupcz  Angela [name] is a 43 y.o. female coming in with complaint of back and neck pain. OMT 10/17/2023. Patient states that she is the same as last visit.  Has had some increasing stress recently that could have exacerbated some of the underlying discomfort and pain.  Medications patient has been prescribed: Effexor   Taking: Yes         Reviewed prior external information including notes and imaging from previous exam, outside providers and external EMR if available.   As well as notes that were available from care everywhere and other healthcare systems.  Past medical history, social, surgical and family history all reviewed in electronic medical record.  No pertanent information unless stated regarding to the chief complaint.   Past Medical History:  Diagnosis Date   Abnormal Pap smear    repeat ok   Fibromyalgia 09/02/2017   Herpes zoster    Panic attacks    PCOS (polycystic ovarian syndrome)    conceived on metformin and clomid   PP SVD 12/02/11 M 12/03/2011    No Known Allergies   Review of Systems:  No headache, visual changes, nausea, vomiting, diarrhea, constipation, dizziness, abdominal pain, skin rash, fevers, chills, night sweats, weight loss, swollen lymph nodes, body aches, joint swelling, chest pain, shortness of breath, mood changes. POSITIVE muscle aches  Objective  Blood pressure 118/84, pulse 93, height 5' 6 (1.676 m).   General: No apparent distress alert and oriented x3 mood and affect normal, dressed appropriately.  HEENT: Pupils equal, extraocular movements intact  Respiratory: Patient's speak in full sentences and does not appear short of breath   Cardiovascular: No lower extremity edema, non tender, no erythema  Gait MSK:  Back does have some hypermobility noted.  Some tenderness to palpation of the paraspinal musculature.  Patient does have some tightness noted in [place] right greater than left.  Osteopathic findings  C2 flexed rotated and side bent right C6 flexed rotated and side bent left T3 extended rotated and side bent right inhaled rib T9 extended rotated and side bent left L2 flexed rotated and side bent right Sacrum right on right       Assessment and Plan:  Chronic pain syndrome Has done relatively well but does have a connective tissue disorder that is contributing in the fibromyalgia.  Continuing the same medications and we have discussed the differences in some of the Effexor  and the Cymbalta.  Has had some increasing stress in her life even though some of it has been good stress.  Discussed icing regimen and home exercises.  Discussed which activities to do and which ones to avoid.  Increase activity slowly otherwise.  Follow-up with me again in 6 to 8 weeks otherwise.    Nonallopathic problems  Decision today to treat with OMT was based on Physical Exam  After verbal consent patient was treated with HVLA, ME, FPR techniques in cervical, rib, thoracic, lumbar, and sacral  areas  Patient tolerated the procedure well with improvement in symptoms  Patient given exercises, stretches and lifestyle modifications  See medications in patient instructions if given  Patient will follow up in 4-8 weeks     The above documentation has been  reviewed and is accurate and complete Arthea CHRISTELLA Sharps, DO         Note: This dictation was prepared with Dragon dictation along with smaller phrase technology. Any transcriptional errors that result from this process are unintentional.

## 2023-12-06 ENCOUNTER — Ambulatory Visit: Payer: PRIVATE HEALTH INSURANCE | Admitting: Family Medicine

## 2023-12-06 ENCOUNTER — Encounter: Payer: Self-pay | Admitting: Family Medicine

## 2023-12-06 VITALS — BP 118/84 | HR 93 | Ht 66.0 in

## 2023-12-06 DIAGNOSIS — M9902 Segmental and somatic dysfunction of thoracic region: Secondary | ICD-10-CM | POA: Diagnosis not present

## 2023-12-06 DIAGNOSIS — M9908 Segmental and somatic dysfunction of rib cage: Secondary | ICD-10-CM

## 2023-12-06 DIAGNOSIS — M9903 Segmental and somatic dysfunction of lumbar region: Secondary | ICD-10-CM

## 2023-12-06 DIAGNOSIS — M9904 Segmental and somatic dysfunction of sacral region: Secondary | ICD-10-CM | POA: Diagnosis not present

## 2023-12-06 DIAGNOSIS — M9901 Segmental and somatic dysfunction of cervical region: Secondary | ICD-10-CM

## 2023-12-06 DIAGNOSIS — G894 Chronic pain syndrome: Secondary | ICD-10-CM | POA: Diagnosis not present

## 2023-12-06 NOTE — Assessment & Plan Note (Signed)
 Has done relatively well but does have a connective tissue disorder that is contributing in the fibromyalgia.  Continuing the same medications and we have discussed the differences in some of the Effexor  and the Cymbalta.  Has had some increasing stress in her life even though some of it has been good stress.  Discussed icing regimen and home exercises.  Discussed which activities to do and which ones to avoid.  Increase activity slowly otherwise.  Follow-up with me again in 6 to 8 weeks otherwise.

## 2023-12-06 NOTE — Patient Instructions (Signed)
Great to see you Have some wonderful problems Thanks for music  See me in 7-8 weeks

## 2024-02-01 NOTE — Progress Notes (Unsigned)
 [name] [address] [place] [address] Phone: [phone] Subjective:   [name], am serving as a scribe for Dr. Antoine Primas.  I'm seeing this patient by the request  of:  Everrett Coombe, DO  CC: Back and neck pain follow-up  BJY:[id number]  Angela [name] is a 43 y.o. female coming in with complaint of back and neck pain. OMT 12/06/2023. Patient states that she has been trying to lose some weight. Has R hip pain that needs adjustment.   Medications patient has been prescribed: Effexor  Taking: Yes         Reviewed prior external information including notes and imaging from previsou exam, outside providers and external EMR if available.   As well as notes that were available from care everywhere and other healthcare systems.  Past medical history, social, surgical and family history all reviewed in electronic medical record.  No pertanent information unless stated regarding to the chief complaint.   Past Medical History:  Diagnosis Date   Abnormal Pap smear    repeat ok   Fibromyalgia 09/02/2017   Herpes zoster    Panic attacks    PCOS (polycystic ovarian syndrome)    conceived on metformin and clomid   PP SVD 12/02/11 M 12/03/2011    No Known Allergies   Review of Systems:  No headache, visual changes, nausea, vomiting, diarrhea, constipation, dizziness, abdominal pain, skin rash, fevers, chills, night sweats, weight loss, swollen lymph nodes, body aches, joint swelling, chest pain, shortness of breath, mood changes. POSITIVE muscle aches  Objective  Blood pressure 110/72, pulse 92, height 5\' 6"  (1.676 m), weight 146 lb (66.2 kg), SpO2 98%.   General: No apparent distress alert and oriented x3 mood and affect normal, dressed appropriately.  HEENT: Pupils equal, extraocular movements intact  Respiratory: Patient's speak in full sentences and does not appear short of breath  Cardiovascular: No lower extremity edema,  non tender, no erythema  Gait MSK:  Back does have significant tightness noted. Tightness no more over the right sacroiliac joint also in the gluteal area.  Piriformis is tight right greater than left.  Hypermobility noted otherwise  Osteopathic findings  C6 flexed rotated and side bent left T3 extended rotated and side bent right inhaled rib T9 extended rotated and side bent left T11 extended rotated and side bent right L1 flexed rotated and side bent right Sacrum right on right       Assessment and Plan:  Hypermobility syndrome Patient is doing well but continue to work on strengthening more than flexibility.  Patient does have significant hypermobility and do feel that that causes more tension on the capsules of patient's joints.  Patient is continuing to workout on a regular basis in order to continue to do so.  Patient's goal is to get down to 130 135 pounds.  Patient's BMI is within regular range already follow-up again within 2 months    Nonallopathic problems  Decision today to treat with OMT was based on Physical Exam  After verbal consent patient was treated with HVLA, ME, FPR techniques in cervical, rib, thoracic, lumbar, and sacral  areas  Patient tolerated the procedure well with improvement in symptoms  Patient given exercises, stretches and lifestyle modifications  See medications in patient instructions if given  Patient will follow up in 4-8 weeks     The above documentation has been reviewed and is accurate and complete Judi Saa, DO  Note: This dictation was prepared with Dragon dictation along with smaller phrase technology. Any transcriptional errors that result from this process are unintentional.

## 2024-02-03 ENCOUNTER — Encounter: Payer: Self-pay | Admitting: Family Medicine

## 2024-02-03 ENCOUNTER — Ambulatory Visit: Payer: PRIVATE HEALTH INSURANCE | Admitting: Family Medicine

## 2024-02-03 VITALS — BP 110/72 | HR 92 | Ht 66.0 in | Wt 146.0 lb

## 2024-02-03 DIAGNOSIS — M9904 Segmental and somatic dysfunction of sacral region: Secondary | ICD-10-CM

## 2024-02-03 DIAGNOSIS — M357 Hypermobility syndrome: Secondary | ICD-10-CM

## 2024-02-03 DIAGNOSIS — M9902 Segmental and somatic dysfunction of thoracic region: Secondary | ICD-10-CM | POA: Diagnosis not present

## 2024-02-03 DIAGNOSIS — M9903 Segmental and somatic dysfunction of lumbar region: Secondary | ICD-10-CM

## 2024-02-03 DIAGNOSIS — M9908 Segmental and somatic dysfunction of rib cage: Secondary | ICD-10-CM | POA: Diagnosis not present

## 2024-02-03 DIAGNOSIS — M9901 Segmental and somatic dysfunction of cervical region: Secondary | ICD-10-CM

## 2024-02-03 NOTE — Assessment & Plan Note (Signed)
 Patient is doing well but continue to work on strengthening more than flexibility.  Patient does have significant hypermobility and do feel that that causes more tension on the capsules of patient's joints.  Patient is continuing to workout on a regular basis in order to continue to do so.  Patient's goal is to get down to 130 135 pounds.  Patient's BMI is within regular range already follow-up again within 2 months

## 2024-02-03 NOTE — Patient Instructions (Signed)
 Good to see you! We'll send in our travel agent See you again in 6-8 weeks

## 2024-02-20 ENCOUNTER — Other Ambulatory Visit: Payer: Self-pay | Admitting: Family Medicine

## 2024-03-22 NOTE — Progress Notes (Unsigned)
 [name] [address] [place] [address] Phone: [phone] Subjective:   Angela [name], am serving as a scribe for Dr. Ronnell [name].  I'm seeing this patient by the request  of:  Angela Holter, DO  CC: Neck and back pain follow-up  BJY:[id number]  Angela [name] is a 43 y.o. female coming in with complaint of back and neck pain. OMT 02/03/2024. Patient states that her upper back and neck are tight.  Medications patient has been prescribed:   Taking:         Reviewed prior external information including notes and imaging from previsou exam, outside providers and external EMR if available.   As well as notes that were available from care everywhere and other healthcare systems.  Past medical history, social, surgical and family history all reviewed in electronic medical record.  No pertanent information unless stated regarding to the chief complaint.   Past Medical History:  Diagnosis Date   Abnormal Pap smear    repeat ok   Fibromyalgia 09/02/2017   Herpes zoster    Panic attacks    PCOS (polycystic ovarian syndrome)    conceived on metformin and clomid   PP SVD 12/02/11 M 12/03/2011    No Known Allergies   Review of Systems:  No headache, visual changes, nausea, vomiting, diarrhea, constipation, dizziness, abdominal pain, skin rash, fevers, chills, night sweats, weight loss, swollen lymph nodes, body aches, joint swelling, chest pain, shortness of breath, mood changes. POSITIVE muscle aches  Objective  Blood pressure 102/62, pulse 92, height 5\' 6"  (1.676 m), weight 146 lb (66.2 kg), SpO2 98%.   General: No apparent distress alert and oriented x3 mood and affect normal, dressed appropriately.  HEENT: Pupils equal, extraocular movements intact  Respiratory: Patient's speak in full sentences and does not appear short of breath  Cardiovascular: No lower extremity edema, non tender, no erythema  Hypermobility noted.  Patient's  neck does have some significant tightness noted with sidebending and rotation bilaterally.  Negative Spurling's noted today.  Patient's upper back also has significant tightness mostly on the right side of the parascapular area.  Osteopathic findings  C2 flexed rotated and side bent right C7 flexed rotated and side bent right T3 extended rotated and side bent right inhaled rib T9 extended rotated and side bent left L2 flexed rotated and side bent right Sacrum right on right       Assessment and Plan:  Fibromyalgia Fibromyalgia, hypermobility, increasing anxiety, significant difficulty overall with some of the job aspect.  Causing aggravation of underlying condition and mild worsening.  Discussed with patient about icing regimen and home exercises, which activities to do and which ones to avoid.  Increase activity slowly.  Follow-up again with me in 6 to 8 weeks.    Nonallopathic problems  Decision today to treat with OMT was based on Physical Exam  After verbal consent patient was treated with HVLA, ME, FPR techniques in cervical, rib, thoracic, lumbar, and sacral  areas  Patient tolerated the procedure well with improvement in symptoms  Patient given exercises, stretches and lifestyle modifications  See medications in patient instructions if given  Patient will follow up in 4-8 weeks    The above documentation has been reviewed and is accurate and complete Angela Lever M Angela Muraski, DO          Note: This dictation was prepared with Dragon dictation along with smaller phrase technology. Any transcriptional errors that result from this process are  unintentional.

## 2024-03-28 ENCOUNTER — Encounter: Payer: Self-pay | Admitting: Family Medicine

## 2024-03-28 ENCOUNTER — Ambulatory Visit: Payer: PRIVATE HEALTH INSURANCE | Admitting: Family Medicine

## 2024-03-28 VITALS — BP 102/62 | HR 92 | Ht 66.0 in | Wt 146.0 lb

## 2024-03-28 DIAGNOSIS — M9901 Segmental and somatic dysfunction of cervical region: Secondary | ICD-10-CM

## 2024-03-28 DIAGNOSIS — M9908 Segmental and somatic dysfunction of rib cage: Secondary | ICD-10-CM | POA: Diagnosis not present

## 2024-03-28 DIAGNOSIS — M797 Fibromyalgia: Secondary | ICD-10-CM

## 2024-03-28 DIAGNOSIS — M9903 Segmental and somatic dysfunction of lumbar region: Secondary | ICD-10-CM | POA: Diagnosis not present

## 2024-03-28 DIAGNOSIS — M9902 Segmental and somatic dysfunction of thoracic region: Secondary | ICD-10-CM

## 2024-03-28 DIAGNOSIS — M9904 Segmental and somatic dysfunction of sacral region: Secondary | ICD-10-CM | POA: Diagnosis not present

## 2024-03-28 NOTE — Patient Instructions (Signed)
 Good to see you Keep planning fun things for yourself Bring some literature See me in a 6-8 weeks

## 2024-03-28 NOTE — Assessment & Plan Note (Signed)
 Fibromyalgia, hypermobility, increasing anxiety, significant difficulty overall with some of the job aspect.  Causing aggravation of underlying condition and mild worsening.  Discussed with patient about icing regimen and home exercises, which activities to do and which ones to avoid.  Increase activity slowly.  Follow-up again with me in 6 to 8 weeks.

## 2024-05-29 NOTE — Progress Notes (Unsigned)
  [name] [address] [place] [address] Phone: [phone] Subjective:   Angela [name], am serving as a scribe for Dr. Arthea Claudene.  I'm seeing this patient by the request  of:  Alvia Bring, DO  CC: back exam f/u   YEP:Dlagzrupcz  Angela [name] is a 43 y.o. female coming in with complaint of back and neck pain. OMT 03/28/2024. Patient states that she is having pain at the base of her skull. Unsure if she is grinding teeth and or stressed.   Medications patient has been prescribed: Effexor   Taking:         Reviewed prior external information including notes and imaging from previsou exam, outside providers and external EMR if available.   As well as notes that were available from care everywhere and other healthcare systems.  Past medical history, social, surgical and family history all reviewed in electronic medical record.  No pertanent information unless stated regarding to the chief complaint.   Past Medical History:  Diagnosis Date   Abnormal Pap smear    repeat ok   Fibromyalgia 09/02/2017   Herpes zoster    Panic attacks    PCOS (polycystic ovarian syndrome)    conceived on metformin and clomid   PP SVD 12/02/11 M 12/03/2011    No Known Allergies   Review of Systems:  No headache, visual changes, nausea, vomiting, diarrhea, constipation, dizziness, abdominal pain, skin rash, fevers, chills, night sweats, weight loss, swollen lymph nodes, body aches, joint swelling, chest pain, shortness of breath, mood changes. POSITIVE muscle aches  Objective  Blood pressure 122/78, height 5' 6 (1.676 m), weight 145 lb (65.8 kg).   General: No apparent distress alert and oriented x3 mood and affect normal, dressed appropriately.  HEENT: Pupils equal, extraocular movements intact  Respiratory: Patient's speak in full sentences and does not appear short of breath  Cardiovascular: No lower extremity edema, non tender, no erythema   MSK:  Back does have loss of lordosis.  Patient does have tightness with [place] right greater than left.  Osteopathic findings  C3 flexed rotated and side bent right C6 flexed rotated and side bent left T3 extended rotated and side bent right inhaled rib T7 extended rotated and side bent left L2 flexed rotated and side bent right Sacrum right on right    Assessment and Plan:  Hypermobility syndrome Discussed with patientregimen of home exercises.  Discussed which activities to do and which ones to avoid.  Will be traveling a long distance.  Has muscle relaxer if needed.  I discussed other ergonomic changes with patient having a 17-hour flight. Follow-up with me again in 6 to 8 weeks otherwise.    Nonallopathic problems  Decision today to treat with OMT was based on Physical Exam  After verbal consent patient was treated with HVLA, ME, FPR techniques in cervical, rib, thoracic, lumbar, and sacral  areas  Patient tolerated the procedure well with improvement in symptoms  Patient given exercises, stretches and lifestyle modifications  See medications in patient instructions if given  Patient will follow up in 4-8 weeks    The above documentation has been reviewed and is accurate and complete Jessiah Steinhart M Annette Liotta, DO          Note: This dictation was prepared with Dragon dictation along with smaller phrase technology. Any transcriptional errors that result from this process are unintentional.

## 2024-05-31 ENCOUNTER — Encounter: Payer: Self-pay | Admitting: Family Medicine

## 2024-05-31 ENCOUNTER — Ambulatory Visit: Payer: PRIVATE HEALTH INSURANCE | Admitting: Family Medicine

## 2024-05-31 VITALS — BP 122/78 | Ht 66.0 in | Wt 145.0 lb

## 2024-05-31 DIAGNOSIS — M357 Hypermobility syndrome: Secondary | ICD-10-CM

## 2024-05-31 DIAGNOSIS — M9903 Segmental and somatic dysfunction of lumbar region: Secondary | ICD-10-CM

## 2024-05-31 DIAGNOSIS — M9901 Segmental and somatic dysfunction of cervical region: Secondary | ICD-10-CM

## 2024-05-31 DIAGNOSIS — M9902 Segmental and somatic dysfunction of thoracic region: Secondary | ICD-10-CM

## 2024-05-31 DIAGNOSIS — M9904 Segmental and somatic dysfunction of sacral region: Secondary | ICD-10-CM | POA: Diagnosis not present

## 2024-05-31 DIAGNOSIS — M9908 Segmental and somatic dysfunction of rib cage: Secondary | ICD-10-CM | POA: Diagnosis not present

## 2024-05-31 NOTE — Assessment & Plan Note (Signed)
 Discussed with patientregimen of home exercises.  Discussed which activities to do and which ones to avoid.  Will be traveling a long distance.  Has muscle relaxer if needed.  I discussed other ergonomic changes with patient having a 17-hour flight. Follow-up with me again in 6 to 8 weeks otherwise.

## 2024-05-31 NOTE — Patient Instructions (Signed)
 Good to see you Get a tattoo that says Sac up in [place] See you at your next appt

## 2024-06-07 ENCOUNTER — Encounter: Payer: PRIVATE HEALTH INSURANCE | Admitting: Family Medicine

## 2024-06-12 ENCOUNTER — Telehealth: Payer: Self-pay

## 2024-06-12 NOTE — Telephone Encounter (Signed)
 Left message requesting the name of the provider that has ordered recent mammogram. We need copy of the record for her chart.

## 2024-06-15 ENCOUNTER — Other Ambulatory Visit (HOSPITAL_COMMUNITY): Payer: Self-pay

## 2024-06-17 ENCOUNTER — Other Ambulatory Visit: Payer: Self-pay | Admitting: Family Medicine

## 2024-06-19 ENCOUNTER — Ambulatory Visit: Payer: PRIVATE HEALTH INSURANCE | Admitting: Family Medicine

## 2024-06-19 NOTE — Progress Notes (Unsigned)
 [name] [address] [place] [address] Phone: [phone] Subjective:   Angela [name], am serving as a scribe for Dr. Arthea [name].  I'm seeing this patient by the request  of:  Alvia Bring, DO  CC: Back and neck pain follow-up  YEP:Dlagzrupcz  Angela [name] is a 43 y.o. female coming in with complaint of back and neck pain. OMT 05/31/2024.  Since we have seen patient she did travel to [place].  Patient states that she tried to do stretches on vacation. Pain over GT that radiates into the R knee.   Medications patient has been prescribed: Effexor   Taking:         Reviewed prior external information including notes and imaging from previsou exam, outside providers and external EMR if available.   As well as notes that were available from care everywhere and other healthcare systems.  Past medical history, social, surgical and family history all reviewed in electronic medical record.  No pertanent information unless stated regarding to the chief complaint.   Past Medical History:  Diagnosis Date   Abnormal Pap smear    repeat ok   Fibromyalgia 09/02/2017   Herpes zoster    Panic attacks    PCOS (polycystic ovarian syndrome)    conceived on metformin and clomid   PP SVD 12/02/11 M 12/03/2011    No Known Allergies   Review of Systems:  No headache, visual changes, nausea, vomiting, diarrhea, constipation, dizziness, abdominal pain, skin rash, fevers, chills, night sweats, weight loss, swollen lymph nodes, body aches, joint swelling, chest pain, shortness of breath, mood changes. POSITIVE muscle aches  Objective  Blood pressure 102/72, pulse 88, height 5' 6 (1.676 m), weight 141 lb (64 kg), SpO2 97%.   General: No apparent distress alert and oriented x3 mood and affect normal, dressed appropriately.  HEENT: Pupils equal, extraocular movements intact  Respiratory: Patient's speak in full sentences and does not appear short of  breath  Cardiovascular: No lower extremity edema, non tender, no erythema  Gait relatively normal MSK:  Neck exam shows tightness noted on the right side of the neck as well with sidebending.  Fairly significant.  Negative Spurling's.  Low back exam shows significant tightness easily from patient's baseline.  Seems to be more on the right side paraspinal musculature.  Tenderness over the greater trochanteric area as well.  Osteopathic findings  C2 flexed rotated and side bent right C7 flexed rotated and side bent left T3 extended rotated and side bent right inhaled rib T9 extended rotated and side bent left T11 extended rotated and side bent right L2 flexed rotated and side bent right Sacrum right on right     Assessment and Plan:  Connective tissue disease (HCC) That does cause some exacerbation with some instability of the joint.  Discussed with patient about continuing strengthening aspect.  Responding extremely well to the Effexor .  Explained well to home exercises as well as when having difficulty osteoporotic medication.  Did well with the significant traveling.  Follow-up with me again in 2 to 3 months    Nonallopathic problems  Decision today to treat with OMT was based on Physical Exam  After verbal consent patient was treated with HVLA, ME, FPR techniques in cervical, rib, thoracic, lumbar, and sacral  areas  Patient tolerated the procedure well with improvement in symptoms  Patient given exercises, stretches and lifestyle modifications  See medications in patient instructions if given  Patient will follow up  in 4-8 weeks     The above documentation has been reviewed and is accurate and complete Arthea CHRISTELLA Sharps, DO         Note: This dictation was prepared with Dragon dictation along with smaller phrase technology. Any transcriptional errors that result from this process are unintentional.

## 2024-06-21 ENCOUNTER — Encounter: Payer: Self-pay | Admitting: Family Medicine

## 2024-06-21 ENCOUNTER — Ambulatory Visit: Payer: PRIVATE HEALTH INSURANCE | Admitting: Family Medicine

## 2024-06-21 VITALS — BP 102/72 | HR 88 | Ht 66.0 in | Wt 141.0 lb

## 2024-06-21 DIAGNOSIS — M9904 Segmental and somatic dysfunction of sacral region: Secondary | ICD-10-CM | POA: Diagnosis not present

## 2024-06-21 DIAGNOSIS — M9908 Segmental and somatic dysfunction of rib cage: Secondary | ICD-10-CM

## 2024-06-21 DIAGNOSIS — M9901 Segmental and somatic dysfunction of cervical region: Secondary | ICD-10-CM

## 2024-06-21 DIAGNOSIS — M359 Systemic involvement of connective tissue, unspecified: Secondary | ICD-10-CM

## 2024-06-21 DIAGNOSIS — M9902 Segmental and somatic dysfunction of thoracic region: Secondary | ICD-10-CM

## 2024-06-21 DIAGNOSIS — M9903 Segmental and somatic dysfunction of lumbar region: Secondary | ICD-10-CM

## 2024-06-21 NOTE — Assessment & Plan Note (Signed)
 That does cause some exacerbation with some instability of the joint.  Discussed with patient about continuing strengthening aspect.  Responding extremely well to the Effexor .  Explained well to home exercises as well as when having difficulty osteoporotic medication.  Did well with the significant traveling.  Follow-up with me again in 2 to 3 months

## 2024-06-21 NOTE — Patient Instructions (Signed)
 Good to see you! Thanks for my banana Good luck with the emails See you again in 2 months

## 2024-06-22 ENCOUNTER — Encounter: Payer: Self-pay | Admitting: Family Medicine

## 2024-06-22 ENCOUNTER — Ambulatory Visit (INDEPENDENT_AMBULATORY_CARE_PROVIDER_SITE_OTHER): Payer: PRIVATE HEALTH INSURANCE | Admitting: Family Medicine

## 2024-06-22 VITALS — BP 102/67 | HR 88 | Ht 65.95 in | Wt 144.6 lb

## 2024-06-22 DIAGNOSIS — E782 Mixed hyperlipidemia: Secondary | ICD-10-CM

## 2024-06-22 DIAGNOSIS — F419 Anxiety disorder, unspecified: Secondary | ICD-10-CM | POA: Diagnosis not present

## 2024-06-22 DIAGNOSIS — R5383 Other fatigue: Secondary | ICD-10-CM | POA: Diagnosis not present

## 2024-06-22 DIAGNOSIS — Z0000 Encounter for general adult medical examination without abnormal findings: Secondary | ICD-10-CM

## 2024-06-22 DIAGNOSIS — F32A Depression, unspecified: Secondary | ICD-10-CM

## 2024-06-22 MED ORDER — ALPRAZOLAM 0.5 MG PO TABS: 0.5000 mg | ORAL_TABLET | Freq: Every day | ORAL | 1 refills | Status: AC | PRN

## 2024-06-22 NOTE — Assessment & Plan Note (Signed)
 Well adult Orders Placed This Encounter  Procedures   CMP14+EGFR   CBC with Differential/Platelet   Lipid Panel With LDL/HDL Ratio   Vitamin D  (25 hydroxy)   TSH   B12  Screenings:  per lab orders Immunizations:  UTD Anticipatory guidance/Risk factor reduction:  Recommendations per AVS.

## 2024-06-22 NOTE — Patient Instructions (Signed)
 Preventive Care 42-42 Years Old, Female  Preventive care refers to lifestyle choices and visits with your health care provider that can promote health and wellness. Preventive care visits are also called wellness exams.  What can I expect for my preventive care visit?  Counseling  Your health care provider may ask you questions about your:  Medical history, including:  Past medical problems.  Family medical history.  Pregnancy history.  Current health, including:  Menstrual cycle.  Method of birth control.  Emotional well-being.  Home life and relationship well-being.  Sexual activity and sexual health.  Lifestyle, including:  Alcohol, nicotine or tobacco, and drug use.  Access to firearms.  Diet, exercise, and sleep habits.  Work and work Astronomer.  Sunscreen use.  Safety issues such as seatbelt and bike helmet use.  Physical exam  Your health care provider will check your:  Height and weight. These may be used to calculate your BMI (body mass index). BMI is a measurement that tells if you are at a healthy weight.  Waist circumference. This measures the distance around your waistline. This measurement also tells if you are at a healthy weight and may help predict your risk of certain diseases, such as type 2 diabetes and high blood pressure.  Heart rate and blood pressure.  Body temperature.  Skin for abnormal spots.  What immunizations do I need?    Vaccines are usually given at various ages, according to a schedule. Your health care provider will recommend vaccines for you based on your age, medical history, and lifestyle or other factors, such as travel or where you work.  What tests do I need?  Screening  Your health care provider may recommend screening tests for certain conditions. This may include:  Lipid and cholesterol levels.  Diabetes screening. This is done by checking your blood sugar (glucose) after you have not eaten for a while (fasting).  Pelvic exam and Pap test.  Hepatitis B test.  Hepatitis C  test.  HIV (human immunodeficiency virus) test.  STI (sexually transmitted infection) testing, if you are at risk.  Lung cancer screening.  Colorectal cancer screening.  Mammogram. Talk with your health care provider about when you should start having regular mammograms. This may depend on whether you have a family history of breast cancer.  BRCA-related cancer screening. This may be done if you have a family history of breast, ovarian, tubal, or peritoneal cancers.  Bone density scan. This is done to screen for osteoporosis.  Talk with your health care provider about your test results, treatment options, and if necessary, the need for more tests.  Follow these instructions at home:  Eating and drinking    Eat a diet that includes fresh fruits and vegetables, whole grains, lean protein, and low-fat dairy products.  Take vitamin and mineral supplements as recommended by your health care provider.  Do not drink alcohol if:  Your health care provider tells you not to drink.  You are pregnant, may be pregnant, or are planning to become pregnant.  If you drink alcohol:  Limit how much you have to 0-1 drink a day.  Know how much alcohol is in your drink. In the [place] one drink equals one 12 oz bottle of beer (355 mL), one 5 oz glass of wine (148 mL), or one 1 oz glass of hard liquor (44 mL).  Lifestyle  Brush your teeth every morning and night with fluoride toothpaste. Floss one time each day.  Exercise for at least  30 minutes 5 or more days each week.  Do not use any products that contain nicotine or tobacco. These products include cigarettes, chewing tobacco, and vaping devices, such as e-cigarettes. If you need help quitting, ask your health care provider.  Do not use drugs.  If you are sexually active, practice safe sex. Use a condom or other form of protection to prevent STIs.  If you do not wish to become pregnant, use a form of birth control. If you plan to become pregnant, see your health care provider for a  prepregnancy visit.  Take aspirin only as told by your health care provider. Make sure that you understand how much to take and what form to take. Work with your health care provider to find out whether it is safe and beneficial for you to take aspirin daily.  Find healthy ways to manage stress, such as:  Meditation, yoga, or listening to music.  Journaling.  Talking to a trusted person.  Spending time with friends and family.  Minimize exposure to UV radiation to reduce your risk of skin cancer.  Safety  Always wear your seat belt while driving or riding in a vehicle.  Do not drive:  If you have been drinking alcohol. Do not ride with someone who has been drinking.  When you are tired or distracted.  While texting.  If you have been using any mind-altering substances or drugs.  Wear a helmet and other protective equipment during sports activities.  If you have firearms in your house, make sure you follow all gun safety procedures.  Seek help if you have been physically or sexually abused.  What's next?  Visit your health care provider once a year for an annual wellness visit.  Ask your health care provider how often you should have your eyes and teeth checked.  Stay up to date on all vaccines.  This information is not intended to replace advice given to you by your health care provider. Make sure you discuss any questions you have with your health care provider.  Document Revised: 04/15/2021 Document Reviewed: 04/15/2021  Elsevier Patient Education  2024 [employer].

## 2024-06-22 NOTE — Progress Notes (Signed)
 Angela [name] - 43 y.o. female MRN [id number]  Date of birth: 1981/03/21  Subjective Chief Complaint  Patient presents with   Annual Exam    HPI Angela [name] is a 43 y.o. female here today for annual exam.   She reports that she is doing well.   She continues to stay pretty active and feels that her diet is pretty good.   Continues to see Dr. Claudene for OMT treatment.  This has worked well for her.   She is a non-smoker.  She consumes EtOH occasionally.   She has regular dental care.   Review of Systems  Constitutional:  Negative for chills, fever, malaise/fatigue and weight loss.  HENT:  Negative for congestion, ear pain and sore throat.   Eyes:  Negative for blurred vision, double vision and pain.  Respiratory:  Negative for cough and shortness of breath.   Cardiovascular:  Negative for chest pain and palpitations.  Gastrointestinal:  Negative for abdominal pain, blood in stool, constipation, heartburn and nausea.  Genitourinary:  Negative for dysuria and urgency.  Musculoskeletal:  Negative for joint pain and myalgias.  Neurological:  Negative for dizziness and headaches.  Endo/Heme/Allergies:  Does not bruise/bleed easily.  Psychiatric/Behavioral:  Negative for depression. The patient is not nervous/anxious and does not have insomnia.     No Known Allergies  Past Medical History:  Diagnosis Date   Abnormal Pap smear    repeat ok   Fibromyalgia 09/02/2017   Herpes zoster    Panic attacks    PCOS (polycystic ovarian syndrome)    conceived on metformin and clomid   PP SVD 12/02/11 M 12/03/2011    Past Surgical History:  Procedure Laterality Date   BREAST BIOPSY Right    Benign   WISDOM TOOTH EXTRACTION      Social History   Socioeconomic History   Marital status: Married    Spouse name: Not on file   Number of children: 1   Years of education: 16   Highest education level: Master's degree (e.g., MA, MS, MEng, MEd, MSW, MBA)  Occupational History    Occupation: Radiation Therapist  Tobacco Use   Smoking status: Former    Current packs/day: 0.00    Types: Cigarettes    Quit date: 11/01/2009    Years since quitting: 14.6   Smokeless tobacco: Never  Vaping Use   Vaping status: Never Used  Substance and Sexual Activity   Alcohol use: Yes    Alcohol/week: 2.0 standard drinks of alcohol    Types: 2 Cans of beer per week   Drug use: No   Sexual activity: Yes    Partners: Male    Birth control/protection: I.U.D.  Other Topics Concern   Not on file  Social History Narrative   Works as a Systems developer; Fun: [place] out with family.    Denies any religious beliefs effecting health care.    Lives with husband and son in a 2 story home.     Education: college.    Social Drivers of Corporate investment banker Strain: Low Risk  (06/20/2024)   Overall Financial Resource Strain (CARDIA)    Difficulty of Paying Living Expenses: Not very hard  Food Insecurity: No Food Insecurity (06/20/2024)   Hunger Vital Sign    Worried About Running Out of Food in the Last Year: Never true    Ran Out of Food in the Last Year: Never true  Transportation Needs: No Transportation Needs (06/20/2024)   PRAPARE -  Administrator, Civil Service (Medical): No    Lack of Transportation (Non-Medical): No  Physical Activity: Insufficiently Active (06/20/2024)   Exercise Vital Sign    Days of Exercise per Week: 4 days    Minutes of Exercise per Session: 30 min  Stress: No Stress Concern Present (06/20/2024)   [employer] of Occupational Health - Occupational Stress Questionnaire    Feeling of Stress: Only a little  Social Connections: Moderately Integrated (06/20/2024)   Social Connection and Isolation Panel    Frequency of Communication with Friends and Family: Three times a week    Frequency of Social Gatherings with Friends and Family: Once a week    Attends Religious Services: Never    Database administrator or Organizations: Yes     Attends Engineer, structural: More than 4 times per year    Marital Status: Married    Family History  Problem Relation Age of Onset   Diabetes Father        uncontrolled   Hypertension Father    Hyperlipidemia Father    Alcohol abuse Other    Arthritis Other    Ovarian cancer Paternal Grandmother    Pancreatic cancer Cousin        paternal 2nd cousin   Anesthesia problems Neg Hx     Health Maintenance  Topic Date Due   Hepatitis B Vaccines 19-59 Average Risk (1 of 3 - 19+ 3-dose series) Never done   HPV VACCINES (1 - 3-dose SCDM series) Never done   MAMMOGRAM  03/09/2023   COVID-19 Vaccine (4 - 2024-25 season) 07/03/2023   INFLUENZA VACCINE  06/01/2024   Cervical Cancer Screening (HPV/Pap Cotest)  03/08/2025   DTaP/Tdap/Td (3 - Td or Tdap) 08/18/2026   Hepatitis C Screening  Completed   HIV Screening  Completed   Pneumococcal Vaccine  Aged Out   Meningococcal B Vaccine  Aged Out     ----------------------------------------------------------------------------------------------------------------------------------------------------------------------------------------------------------------- Physical Exam BP 102/67 (BP Location: Left Arm, Patient Position: Sitting, Cuff Size: Normal)   Pulse 88   Ht 5' 5.95 (1.675 m)   Wt 144 lb 9.6 oz (65.6 kg)   SpO2 100%   BMI 23.38 kg/m   Physical Exam Constitutional:      General: She is not in acute distress. HENT:     Head: Normocephalic and atraumatic.     Right Ear: Tympanic membrane and ear canal normal.     Left Ear: Tympanic membrane and ear canal normal.     Nose: Nose normal.  Eyes:     General: No scleral icterus.    Conjunctiva/sclera: Conjunctivae normal.  Neck:     Thyroid : No thyromegaly.  Cardiovascular:     Rate and Rhythm: Normal rate and regular rhythm.     Heart sounds: Normal heart sounds.  Pulmonary:     Effort: Pulmonary effort is normal.     Breath sounds: Normal breath sounds.   Abdominal:     General: Bowel sounds are normal. There is no distension.     Palpations: Abdomen is soft.     Tenderness: There is no abdominal tenderness. There is no guarding.  Musculoskeletal:        General: Normal range of motion.     Cervical back: Normal range of motion and neck supple.  Lymphadenopathy:     Cervical: No cervical adenopathy.  Skin:    General: Skin is warm and dry.     Findings: No rash.  Neurological:  General: No focal deficit present.     Mental Status: She is alert and oriented to person, place, and time.     Cranial Nerves: No cranial nerve deficit.     Coordination: Coordination normal.  Psychiatric:        Mood and Affect: Mood normal.        Behavior: Behavior normal.     ------------------------------------------------------------------------------------------------------------------------------------------------------------------------------------------------------------------- Assessment and Plan  Well adult exam Well adult Orders Placed This Encounter  Procedures   CMP14+EGFR   CBC with Differential/Platelet   Lipid Panel With LDL/HDL Ratio   Vitamin D  (25 hydroxy)   TSH   B12  Screenings:  per lab orders Immunizations:  UTD Anticipatory guidance/Risk factor reduction:  Recommendations per AVS.    Meds ordered this encounter  Medications   ALPRAZolam  (XANAX ) 0.5 MG tablet    Sig: Take 1 tablet (0.5 mg total) by mouth daily as needed for anxiety or sleep.    Dispense:  20 tablet    Refill:  1    No follow-ups on file.

## 2024-06-23 LAB — CMP14+EGFR
ALT: 13 IU/L (ref 0–32)
AST: 17 IU/L (ref 0–40)
Albumin: 4.7 g/dL (ref 3.9–4.9)
Alkaline Phosphatase: 77 IU/L (ref 44–121)
BUN/Creatinine Ratio: 17 (ref 9–23)
BUN: 13 mg/dL (ref 6–24)
Bilirubin Total: 0.5 mg/dL (ref 0.0–1.2)
CO2: 15 mmol/L — ABNORMAL LOW (ref 20–29)
Calcium: 9.6 mg/dL (ref 8.7–10.2)
Chloride: 103 mmol/L (ref 96–106)
Creatinine, Ser: 0.77 mg/dL (ref 0.57–1.00)
Globulin, Total: 2.6 g/dL (ref 1.5–4.5)
Glucose: 83 mg/dL (ref 70–99)
Potassium: 4.3 mmol/L (ref 3.5–5.2)
Sodium: 138 mmol/L (ref 134–144)
Total Protein: 7.3 g/dL (ref 6.0–8.5)
eGFR: 99 mL/min/1.73 (ref >59)

## 2024-06-23 LAB — CBC WITH DIFFERENTIAL/PLATELET
Basophils Absolute: 0 x10E3/uL (ref 0.0–0.2)
Basos: 1 %
EOS (ABSOLUTE): 0.1 x10E3/uL (ref 0.0–0.4)
Eos: 1 %
Hematocrit: 42.5 % (ref 34.0–46.6)
Hemoglobin: 13.6 g/dL (ref 11.1–15.9)
Immature Grans (Abs): 0 x10E3/uL (ref 0.0–0.1)
Immature Granulocytes: 0 %
Lymphocytes Absolute: 1.8 x10E3/uL (ref 0.7–3.1)
Lymphs: 34 %
MCH: 29.1 pg (ref 26.6–33.0)
MCHC: 32 g/dL (ref 31.5–35.7)
MCV: 91 fL (ref 79–97)
Monocytes Absolute: 0.4 x10E3/uL (ref 0.1–0.9)
Monocytes: 7 %
Neutrophils Absolute: 3 x10E3/uL (ref 1.4–7.0)
Neutrophils: 57 %
Platelets: 316 x10E3/uL (ref 150–450)
RBC: 4.68 x10E6/uL (ref 3.77–5.28)
RDW: 12.8 % (ref 11.7–15.4)
WBC: 5.2 x10E3/uL (ref 3.4–10.8)

## 2024-06-23 LAB — LIPID PANEL WITH LDL/HDL RATIO
Cholesterol, Total: 217 mg/dL — ABNORMAL HIGH (ref 100–199)
HDL: 71 mg/dL (ref >39)
LDL Chol Calc (NIH): 135 mg/dL — ABNORMAL HIGH (ref 0–99)
LDL/HDL Ratio: 1.9 ratio (ref 0.0–3.2)
Triglycerides: 65 mg/dL (ref 0–149)
VLDL Cholesterol Cal: 11 mg/dL (ref 5–40)

## 2024-06-23 LAB — TSH: TSH: 2.09 u[IU]/mL (ref 0.450–4.500)

## 2024-06-23 LAB — VITAMIN B12: Vitamin B-12: 1140 pg/mL (ref 232–1245)

## 2024-06-23 LAB — VITAMIN D 25 HYDROXY (VIT D DEFICIENCY, FRACTURES): Vit D, 25-Hydroxy: 29.2 ng/mL — ABNORMAL LOW (ref 30.0–100.0)

## 2024-06-29 LAB — HM MAMMOGRAPHY

## 2024-07-03 ENCOUNTER — Encounter: Payer: Self-pay | Admitting: Family Medicine

## 2024-07-08 ENCOUNTER — Ambulatory Visit: Payer: Self-pay | Admitting: Family Medicine

## 2024-08-07 NOTE — Progress Notes (Unsigned)
 [name] [address] [place] [address] Phone: [phone] Subjective:   Angela [name], am serving as a scribe for Dr. Arthea Claudene.  I'm seeing this patient by the request  of:  Alvia Bring, DO  CC: Back and neck pain follow-up  YEP:Angela [name]  Angela [name] is a 43 y.o. female coming in with complaint of back and neck pain. OMT 06/21/2024. Patient states that she has traveled a few times.   Medications patient has been prescribed: Effexor   Taking: Yes      Recent workup by primary care provider did show a low vitamin D  again.   Reviewed prior external information including notes and imaging from previsou exam, outside providers and external EMR if available.   As well as notes that were available from care everywhere and other healthcare systems.  Past medical history, social, surgical and family history all reviewed in electronic medical record.  No pertanent information unless stated regarding to the chief complaint.   Past Medical History:  Diagnosis Date   Abnormal Pap smear    repeat ok   Fibromyalgia 09/02/2017   Herpes zoster    Panic attacks    PCOS (polycystic ovarian syndrome)    conceived on metformin and clomid   PP SVD 12/02/11 M 12/03/2011    No Known Allergies   Review of Systems:  No headache, visual changes, nausea, vomiting, diarrhea, constipation, dizziness, abdominal pain, skin rash, fevers, chills, night sweats, weight loss, swollen lymph nodes, body aches, joint swelling, chest pain, shortness of breath, mood changes. POSITIVE muscle aches  Objective  Blood pressure 118/84, pulse 100, height 5' 6 (1.676 m), weight 147 lb (66.7 kg), SpO2 94%.   General: No apparent distress alert and oriented x3 mood and affect normal, dressed appropriately.  HEENT: Pupils equal, extraocular movements intact  Respiratory: Patient's speak in full sentences and does not appear short of breath  Cardiovascular: No  lower extremity edema, non tender, no erythema  Gait MSK:  Back does have significant tightness noted more on the paraspinal musculature of the lumbar spine right greater than left.  Negative straight leg test noted.  Tightness with FABER right greater than left.  Pain with I did not know they made 3 injections seem short  Osteopathic findings  C3 flexed rotated and side bent right C6 flexed rotated and side bent left T3 extended rotated and side bent right inhaled rib T4 extended rotated and side bent left L2 flexed rotated and side bent right Sacrum right on right       Assessment and Plan:  Hypermobility syndrome Patient has been traveling a lot more stress than usual.  Discussed icing regimen and home exercises, continue the Effexor  at the dose or at but may need to increase depending on life situations.  Patient has been able to be active where she can but has been out of her routine secondary to travel.  Follow-up with me again in 6 to 8 weeks otherwise.    Nonallopathic problems  Decision today to treat with OMT was based on Physical Exam  After verbal consent patient was treated with HVLA, ME, FPR techniques in cervical, rib, thoracic, lumbar, and sacral  areas  Patient tolerated the procedure well with improvement in symptoms  Patient given exercises, stretches and lifestyle modifications  See medications in patient instructions if given  Patient will follow up in 4-8 weeks    The above documentation has been reviewed and is accurate and  complete Angela Macaraeg M Tache Bobst, DO          Note: This dictation was prepared with Dragon dictation along with smaller phrase technology. Any transcriptional errors that result from this process are unintentional.

## 2024-08-09 ENCOUNTER — Ambulatory Visit (INDEPENDENT_AMBULATORY_CARE_PROVIDER_SITE_OTHER): Payer: PRIVATE HEALTH INSURANCE | Admitting: Family Medicine

## 2024-08-09 ENCOUNTER — Encounter: Payer: Self-pay | Admitting: Family Medicine

## 2024-08-09 VITALS — BP 118/84 | HR 100 | Ht 66.0 in | Wt 147.0 lb

## 2024-08-09 DIAGNOSIS — M9902 Segmental and somatic dysfunction of thoracic region: Secondary | ICD-10-CM

## 2024-08-09 DIAGNOSIS — M9908 Segmental and somatic dysfunction of rib cage: Secondary | ICD-10-CM

## 2024-08-09 DIAGNOSIS — M9904 Segmental and somatic dysfunction of sacral region: Secondary | ICD-10-CM

## 2024-08-09 DIAGNOSIS — M357 Hypermobility syndrome: Secondary | ICD-10-CM | POA: Diagnosis not present

## 2024-08-09 DIAGNOSIS — M9903 Segmental and somatic dysfunction of lumbar region: Secondary | ICD-10-CM

## 2024-08-09 DIAGNOSIS — M9901 Segmental and somatic dysfunction of cervical region: Secondary | ICD-10-CM

## 2024-08-09 NOTE — Assessment & Plan Note (Signed)
 Patient has been traveling a lot more stress than usual.  Discussed icing regimen and home exercises, continue the Effexor  at the dose or at but may need to increase depending on life situations.  Patient has been able to be active where she can but has been out of her routine secondary to travel.  Follow-up with me again in 6 to 8 weeks otherwise.

## 2024-08-09 NOTE — Patient Instructions (Signed)
 Good to see you Let me know if you need help See me in 2 months

## 2024-10-10 NOTE — Progress Notes (Unsigned)
°  Angela [name] Angela [name] [address] [place] [address] Phone: [phone] Subjective:   Angela [name], am serving as a scribe for Dr. Arthea [name].  I'm seeing this patient by the request  of:  Angela Bring, DO  CC: Back and neck pain follow-up  YEP:Dlagzrupcz  Angela [name] is a 43 y.o. female coming in with complaint of back and neck pain. OMT 08/09/2024. Patient states that her back is doing ok. Not working out as much due to school.   Medications patient has been prescribed: Effexor   Taking:         Reviewed prior external information including notes and imaging from previsou exam, outside providers and external EMR if available.   As well as notes that were available from care everywhere and other healthcare systems.  Past medical history, social, surgical and family history all reviewed in electronic medical record.  No pertanent information unless stated regarding to the chief complaint.   Past Medical History:  Diagnosis Date   Abnormal Pap smear    repeat ok   Fibromyalgia 09/02/2017   Herpes zoster    Panic attacks    PCOS (polycystic ovarian syndrome)    conceived on metformin and clomid   PP SVD 12/02/11 M 12/03/2011    No Known Allergies   Review of Systems:  No headache, visual changes, nausea, vomiting, diarrhea, constipation, dizziness, abdominal pain, skin rash, fevers, chills, night sweats, weight loss, swollen lymph nodes, body aches, joint swelling, chest pain, shortness of breath, mood changes. POSITIVE muscle aches  Objective  Blood pressure 118/78, pulse 90, height 5' 6 (1.676 m), SpO2 100%.   General: No apparent distress alert and oriented x3 mood and affect normal, dressed appropriately.  HEENT: Pupils equal, extraocular movements intact  Respiratory: Patient's speak in full sentences and does not appear short of breath  Cardiovascular: No lower extremity edema, non tender, no erythema  Gait MSK:  Back  does have some point tenderness to palpation noted.  Tenderness noted in the paraspinal musculature.  Patient does have some limited sidebending of the neck bilaterally.  Osteopathic findings  C2 flexed rotated and side bent right C6 flexed rotated and side bent left T3 extended rotated and side bent right inhaled rib T9 extended rotated and side bent left L2 flexed rotated and side bent right Sacrum right on right       Assessment and Plan:  Connective tissue disease Has been doing better overall.  Still taking her Effexor .  We discussed icing regimen of home exercises, discussed which activities to do and which ones to avoid.  Increase activity slowly.  Discussed icing regimen.  Follow-up again in 6 to 12 weeks.    Nonallopathic problems  Decision today to treat with OMT was based on Physical Exam  After verbal consent patient was treated with HVLA, ME, FPR techniques in cervical, rib, thoracic, lumbar, and sacral  areas  Patient tolerated the procedure well with improvement in symptoms  Patient given exercises, stretches and lifestyle modifications  See medications in patient instructions if given  Patient will follow up in 4-8 weeks     The above documentation has been reviewed and is accurate and complete Angela [name] M Angela Belote, DO         Note: This dictation was prepared with Dragon dictation along with smaller phrase technology. Any transcriptional errors that result from this process are unintentional.

## 2024-10-12 ENCOUNTER — Ambulatory Visit: Payer: PRIVATE HEALTH INSURANCE | Admitting: Family Medicine

## 2024-10-12 ENCOUNTER — Encounter: Payer: Self-pay | Admitting: Family Medicine

## 2024-10-12 VITALS — BP 118/78 | HR 90 | Ht 66.0 in

## 2024-10-12 DIAGNOSIS — M359 Systemic involvement of connective tissue, unspecified: Secondary | ICD-10-CM

## 2024-10-12 DIAGNOSIS — M9901 Segmental and somatic dysfunction of cervical region: Secondary | ICD-10-CM

## 2024-10-12 DIAGNOSIS — M9903 Segmental and somatic dysfunction of lumbar region: Secondary | ICD-10-CM | POA: Diagnosis not present

## 2024-10-12 DIAGNOSIS — M9902 Segmental and somatic dysfunction of thoracic region: Secondary | ICD-10-CM | POA: Diagnosis not present

## 2024-10-12 DIAGNOSIS — M9904 Segmental and somatic dysfunction of sacral region: Secondary | ICD-10-CM | POA: Diagnosis not present

## 2024-10-12 DIAGNOSIS — M9908 Segmental and somatic dysfunction of rib cage: Secondary | ICD-10-CM | POA: Diagnosis not present

## 2024-10-12 NOTE — Assessment & Plan Note (Signed)
 Has been doing better overall.  Still taking her Effexor .  We discussed icing regimen of home exercises, discussed which activities to do and which ones to avoid.  Increase activity slowly.  Discussed icing regimen.  Follow-up again in 6 to 12 weeks.

## 2024-10-12 NOTE — Patient Instructions (Addendum)
 Yeah send us  that info  See me in 6-8 weeks

## 2024-10-14 ENCOUNTER — Other Ambulatory Visit: Payer: Self-pay | Admitting: Family Medicine

## 2024-11-30 NOTE — Progress Notes (Unsigned)
" °  [name] [address] [place] [address] Phone: [phone] Subjective:    I'm seeing this patient by the request  of:  Alvia Bring, DO  CC:   YEP:Angela  [name] is a 44 y.o. female coming in with complaint of back and neck pain. OTM 10/12/2024. Patient states   Medications patient has been prescribed: Effexor   Taking:         Reviewed prior external information including notes and imaging from previsou exam, outside providers and external EMR if available.   As well as notes that were available from care everywhere and other healthcare systems.  Past medical history, social, surgical and family history all reviewed in electronic medical record.  No pertanent information unless stated regarding to the chief complaint.   Past Medical History:  Diagnosis Date   Abnormal Pap smear    repeat ok   Fibromyalgia 09/02/2017   Herpes zoster    Panic attacks    PCOS (polycystic ovarian syndrome)    conceived on metformin and clomid   PP SVD 12/02/11 M 12/03/2011    Allergies[1]   Review of Systems:  No headache, visual changes, nausea, vomiting, diarrhea, constipation, dizziness, abdominal pain, skin rash, fevers, chills, night sweats, weight loss, swollen lymph nodes, body aches, joint swelling, chest pain, shortness of breath, mood changes. POSITIVE muscle aches  Objective  There were no vitals taken for this visit.   General: No apparent distress alert and oriented x3 mood and affect normal, dressed appropriately.  HEENT: Pupils equal, extraocular movements intact  Respiratory: Patient's speak in full sentences and does not appear short of breath  Cardiovascular: No lower extremity edema, non tender, no erythema  Gait MSK:  Back   Osteopathic findings  C2 flexed rotated and side bent right C6 flexed rotated and side bent left T3 extended rotated and side bent right inhaled rib T9 extended rotated and side  bent left L2 flexed rotated and side bent right Sacrum right on right       Assessment and Plan:  No problem-specific Assessment & Plan notes found for this encounter.    Nonallopathic problems  Decision today to treat with OMT was based on Physical Exam  After verbal consent patient was treated with HVLA, ME, FPR techniques in cervical, rib, thoracic, lumbar, and sacral  areas  Patient tolerated the procedure well with improvement in symptoms  Patient given exercises, stretches and lifestyle modifications  See medications in patient instructions if given  Patient will follow up in 4-8 weeks             Note: This dictation was prepared with Dragon dictation along with smaller phrase technology. Any transcriptional errors that result from this process are unintentional.             [1] No Known Allergies  "

## 2024-12-06 ENCOUNTER — Ambulatory Visit: Payer: PRIVATE HEALTH INSURANCE | Admitting: Family Medicine

## 2024-12-31 ENCOUNTER — Ambulatory Visit: Admitting: Family Medicine
